# Patient Record
Sex: Female | Born: 1955 | Race: White | Hispanic: No | Smoking: Former smoker
Health system: Southern US, Community
[De-identification: ages and names within clinical notes are randomized; demographics above are authoritative.]

## PROBLEM LIST (undated history)

## (undated) DIAGNOSIS — I951 Orthostatic hypotension: Secondary | ICD-10-CM

## (undated) DIAGNOSIS — R002 Palpitations: Secondary | ICD-10-CM

## (undated) DIAGNOSIS — I4891 Unspecified atrial fibrillation: Secondary | ICD-10-CM

## (undated) DIAGNOSIS — I499 Cardiac arrhythmia, unspecified: Secondary | ICD-10-CM

## (undated) DIAGNOSIS — T7840XA Allergy, unspecified, initial encounter: Secondary | ICD-10-CM

## (undated) DIAGNOSIS — K635 Polyp of colon: Secondary | ICD-10-CM

## (undated) DIAGNOSIS — M810 Age-related osteoporosis without current pathological fracture: Secondary | ICD-10-CM

## (undated) DIAGNOSIS — K219 Gastro-esophageal reflux disease without esophagitis: Secondary | ICD-10-CM

## (undated) DIAGNOSIS — M1990 Unspecified osteoarthritis, unspecified site: Secondary | ICD-10-CM

## (undated) DIAGNOSIS — J189 Pneumonia, unspecified organism: Secondary | ICD-10-CM

## (undated) DIAGNOSIS — C801 Malignant (primary) neoplasm, unspecified: Secondary | ICD-10-CM

## (undated) DIAGNOSIS — Z8619 Personal history of other infectious and parasitic diseases: Secondary | ICD-10-CM

## (undated) DIAGNOSIS — R7989 Other specified abnormal findings of blood chemistry: Secondary | ICD-10-CM

## (undated) DIAGNOSIS — H269 Unspecified cataract: Secondary | ICD-10-CM

## (undated) DIAGNOSIS — Z8542 Personal history of malignant neoplasm of other parts of uterus: Secondary | ICD-10-CM

## (undated) DIAGNOSIS — D352 Benign neoplasm of pituitary gland: Secondary | ICD-10-CM

## (undated) DIAGNOSIS — E785 Hyperlipidemia, unspecified: Secondary | ICD-10-CM

## (undated) DIAGNOSIS — H9190 Unspecified hearing loss, unspecified ear: Secondary | ICD-10-CM

## (undated) HISTORY — DX: Personal history of malignant neoplasm of other parts of uterus: Z85.42

## (undated) HISTORY — DX: Personal history of other infectious and parasitic diseases: Z86.19

## (undated) HISTORY — PX: ABDOMINAL HYSTERECTOMY: SHX81

## (undated) HISTORY — DX: Unspecified atrial fibrillation: I48.91

## (undated) HISTORY — DX: Polyp of colon: K63.5

## (undated) HISTORY — DX: Gastro-esophageal reflux disease without esophagitis: K21.9

## (undated) HISTORY — DX: Benign neoplasm of pituitary gland: D35.2

## (undated) HISTORY — DX: Age-related osteoporosis without current pathological fracture: M81.0

## (undated) HISTORY — DX: Orthostatic hypotension: I95.1

## (undated) HISTORY — DX: Hyperlipidemia, unspecified: E78.5

## (undated) HISTORY — DX: Unspecified cataract: H26.9

## (undated) HISTORY — DX: Other specified abnormal findings of blood chemistry: R79.89

## (undated) HISTORY — PX: CATARACT EXTRACTION: SUR2

## (undated) HISTORY — DX: Unspecified hearing loss, unspecified ear: H91.90

## (undated) HISTORY — DX: Allergy, unspecified, initial encounter: T78.40XA

---
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
## 1998-05-20 DIAGNOSIS — I4891 Unspecified atrial fibrillation: Secondary | ICD-10-CM

## 1998-05-20 HISTORY — DX: Unspecified atrial fibrillation: I48.91

## 1998-10-17 ENCOUNTER — Inpatient Hospital Stay (HOSPITAL_COMMUNITY): Admission: EM | Admit: 1998-10-17 | Discharge: 1998-10-18 | Payer: Self-pay

## 1998-10-17 ENCOUNTER — Encounter: Payer: Self-pay | Admitting: Cardiology

## 1998-11-15 ENCOUNTER — Other Ambulatory Visit: Admission: RE | Admit: 1998-11-15 | Discharge: 1998-11-15 | Payer: Self-pay | Admitting: Obstetrics and Gynecology

## 2003-09-07 ENCOUNTER — Ambulatory Visit (HOSPITAL_COMMUNITY): Admission: RE | Admit: 2003-09-07 | Discharge: 2003-09-07 | Payer: Self-pay | Admitting: Family Medicine

## 2003-10-05 ENCOUNTER — Encounter: Admission: RE | Admit: 2003-10-05 | Discharge: 2003-10-05 | Payer: Self-pay | Admitting: Family Medicine

## 2004-07-10 ENCOUNTER — Ambulatory Visit (HOSPITAL_COMMUNITY): Admission: RE | Admit: 2004-07-10 | Discharge: 2004-07-10 | Payer: Self-pay | Admitting: Family Medicine

## 2004-07-17 ENCOUNTER — Other Ambulatory Visit: Admission: RE | Admit: 2004-07-17 | Discharge: 2004-07-17 | Payer: Self-pay | Admitting: Obstetrics and Gynecology

## 2004-08-06 ENCOUNTER — Encounter: Admission: RE | Admit: 2004-08-06 | Discharge: 2004-08-06 | Payer: Self-pay | Admitting: Obstetrics and Gynecology

## 2005-12-13 ENCOUNTER — Ambulatory Visit (HOSPITAL_COMMUNITY): Admission: RE | Admit: 2005-12-13 | Discharge: 2005-12-13 | Payer: Self-pay | Admitting: Obstetrics and Gynecology

## 2005-12-24 ENCOUNTER — Encounter: Admission: RE | Admit: 2005-12-24 | Discharge: 2005-12-24 | Payer: Self-pay | Admitting: Obstetrics and Gynecology

## 2009-12-19 ENCOUNTER — Other Ambulatory Visit: Admission: RE | Admit: 2009-12-19 | Discharge: 2009-12-19 | Payer: Self-pay | Admitting: Obstetrics and Gynecology

## 2010-05-20 DIAGNOSIS — H9190 Unspecified hearing loss, unspecified ear: Secondary | ICD-10-CM

## 2010-05-20 HISTORY — DX: Unspecified hearing loss, unspecified ear: H91.90

## 2010-06-10 ENCOUNTER — Encounter: Payer: Self-pay | Admitting: Obstetrics and Gynecology

## 2010-06-11 ENCOUNTER — Encounter: Payer: Self-pay | Admitting: Neurology

## 2011-07-23 ENCOUNTER — Ambulatory Visit (INDEPENDENT_AMBULATORY_CARE_PROVIDER_SITE_OTHER): Payer: 59 | Admitting: Family Medicine

## 2011-07-23 DIAGNOSIS — H9190 Unspecified hearing loss, unspecified ear: Secondary | ICD-10-CM

## 2011-07-23 DIAGNOSIS — R42 Dizziness and giddiness: Secondary | ICD-10-CM

## 2011-07-23 LAB — POCT CBC
Granulocyte percent: 60.4 %G (ref 37–80)
HCT, POC: 44.8 % (ref 37.7–47.9)
Hemoglobin: 14.6 g/dL (ref 12.2–16.2)
MCV: 97.7 fL — AB (ref 80–97)
POC Granulocyte: 5.3 (ref 2–6.9)
POC MID %: 6.7 %M (ref 0–12)
WBC: 8.8 10*3/uL (ref 4.6–10.2)

## 2011-07-23 LAB — POCT SEDIMENTATION RATE: POCT SED RATE: 15 mm/hr (ref 0–22)

## 2011-07-23 LAB — POCT GLYCOSYLATED HEMOGLOBIN (HGB A1C): Hemoglobin A1C: 5

## 2011-07-23 MED ORDER — PREDNISONE 20 MG PO TABS: ORAL_TABLET | ORAL | Status: AC

## 2011-07-23 NOTE — Patient Instructions (Signed)
Take medicines as ordered. If dizzy be very careful with driving or other machinery. Return if worse.

## 2011-07-23 NOTE — Progress Notes (Signed)
Subjective Crystal Bauer is a 56 year old white female who comes in here with history of generally having been pretty healthy. She had some test and by Dr. love a few years ago and had a positive testing they could indicate a Sjogren's or lupus, and she is going to see a rheumatologist Dr. Kellie Simmering next week. She has not followed up with the neurologist. In [facility] had any episode of ear problems, and the nurse at work told her to just take some Benadryl and give a little time which she did. She has continued to have decreased hearing in that left ear and saw Dr. Ezzard Standing last week. He did testing as well as having the audiologist test. She has diminished hearing in the left ear. He treated to a possible viral nerve involvement of the ear, and asked her to get checked again in about 6 months. However today when she got up she was acutely dizzy. She had to lay back down because the dizziness was intense enough. This is continued to bother her through the day but the symptoms diminished while waiting here in the office the last several hours. Did not take any medications today.  She has not had any cold or sore throat symptoms or any coughing. Otherwise she feels okay.  Objective: Alert oriented lady in no acute distress at this time TMs appear entirely normal. Eyes are PERRLA. EOMs intact. Throat clear. Neck supple without significant lymphadenopathy. She feels a little abnormal at the angle of the left jaw, but there is only minimal nodal enlargement. No carotid bruits. Chest is clear to auscultation. Heart regular.  Neurologic exam was grossly normal with cranial nerves 2-12 intact, Romberg negative, tandem walk normal, finger to nose normal, and grossly she is using all extremities normal.  Assessment: Nonspecific dizziness and left-sided hearing loss, with acute flare, etiology undetermined.

## 2011-07-24 LAB — COMPREHENSIVE METABOLIC PANEL
AST: 20 U/L (ref 0–37)
BUN: 11 mg/dL (ref 6–23)
CO2: 27 mEq/L (ref 19–32)
Calcium: 9.9 mg/dL (ref 8.4–10.5)
Chloride: 106 mEq/L (ref 96–112)
Glucose, Bld: 84 mg/dL (ref 70–99)
Potassium: 4.9 mEq/L (ref 3.5–5.3)
Sodium: 140 mEq/L (ref 135–145)
Total Bilirubin: 0.4 mg/dL (ref 0.3–1.2)

## 2011-07-30 ENCOUNTER — Encounter: Payer: Self-pay | Admitting: *Deleted

## 2012-10-01 ENCOUNTER — Encounter: Payer: Self-pay | Admitting: Internal Medicine

## 2012-10-01 ENCOUNTER — Ambulatory Visit (INDEPENDENT_AMBULATORY_CARE_PROVIDER_SITE_OTHER): Payer: BC Managed Care – PPO | Admitting: Internal Medicine

## 2012-10-01 ENCOUNTER — Other Ambulatory Visit (INDEPENDENT_AMBULATORY_CARE_PROVIDER_SITE_OTHER): Payer: BC Managed Care – PPO

## 2012-10-01 VITALS — BP 108/72 | HR 63 | Temp 98.4°F | Ht 67.5 in | Wt 186.5 lb

## 2012-10-01 DIAGNOSIS — Z130 Encounter for screening for diseases of the blood and blood-forming organs and certain disorders involving the immune mechanism: Secondary | ICD-10-CM

## 2012-10-01 DIAGNOSIS — Z131 Encounter for screening for diabetes mellitus: Secondary | ICD-10-CM

## 2012-10-01 DIAGNOSIS — Z1329 Encounter for screening for other suspected endocrine disorder: Secondary | ICD-10-CM

## 2012-10-01 DIAGNOSIS — L989 Disorder of the skin and subcutaneous tissue, unspecified: Secondary | ICD-10-CM

## 2012-10-01 DIAGNOSIS — Z13220 Encounter for screening for lipoid disorders: Secondary | ICD-10-CM

## 2012-10-01 DIAGNOSIS — H9312 Tinnitus, left ear: Secondary | ICD-10-CM

## 2012-10-01 DIAGNOSIS — Z1239 Encounter for other screening for malignant neoplasm of breast: Secondary | ICD-10-CM

## 2012-10-01 DIAGNOSIS — Z1211 Encounter for screening for malignant neoplasm of colon: Secondary | ICD-10-CM

## 2012-10-01 DIAGNOSIS — Z0000 Encounter for general adult medical examination without abnormal findings: Secondary | ICD-10-CM

## 2012-10-01 DIAGNOSIS — H9319 Tinnitus, unspecified ear: Secondary | ICD-10-CM

## 2012-10-01 LAB — LIPID PANEL
Cholesterol: 214 mg/dL — ABNORMAL HIGH (ref 0–200)
HDL: 51.4 mg/dL (ref >39.00)
Triglycerides: 109 mg/dL (ref 0.0–149.0)

## 2012-10-01 LAB — CBC
HCT: 42.3 % (ref 36.0–46.0)
Hemoglobin: 14.4 g/dL (ref 12.0–15.0)
MCV: 95.6 fl (ref 78.0–100.0)
Platelets: 284 10*3/uL (ref 150.0–400.0)
RBC: 4.43 Mil/uL (ref 3.87–5.11)
WBC: 7.1 10*3/uL (ref 4.5–10.5)

## 2012-10-01 LAB — BASIC METABOLIC PANEL
Calcium: 9.2 mg/dL (ref 8.4–10.5)
GFR: 87.55 mL/min (ref >60.00)
Glucose, Bld: 87 mg/dL (ref 70–99)
Potassium: 4.7 mEq/L (ref 3.5–5.1)

## 2012-10-01 LAB — LDL CHOLESTEROL, DIRECT: Direct LDL: 142.5 mg/dL

## 2012-10-01 LAB — TSH: TSH: 0.86 u[IU]/mL (ref 0.35–5.50)

## 2012-10-01 NOTE — Progress Notes (Signed)
HPI  Pt presents to the clinic today to establish care. She has not had a PCP, just a gyn. She does have some concerns today. 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 She is not sure what is causing them. She would like to have them removed if at all possible. She also has had c/o feelings of fluid in her left ear along with a buzzing sound. She has been evaluated by ENT who said that she had a normal exam. The buzzing is constant. She is not on any medications that would cause the constant buzzing sound. She has not had any trauma to the ear. She is wondering if there is any further evaluation that could be done to assess for the cause of her ear issue. She has has a spot on her right leg where an actinic keratosis has been removed some years back. The area recently became itchy and she scratched it. It opened up, bled a little and scabbed over. The concerning part is that there seems to be and expanding area of redness around the area where she had the biopsy taking. This has been expanding over the last 2 months. She has not seen a dermatologist, she would like referral  Today.  Flu: no Tetanus: UTD Eye doctor: yearly Dentist: yearly Colonoscopy: never Mammogram: 2012 LMP: menopausal Pap: 2012  History reviewed. No pertinent past medical history.  No current outpatient prescriptions on file.   No current facility-administered medications for this visit.    Allergies  Allergen Reactions  . Erythromycin Shortness Of Breath  . Quinolones Other (See Comments)    Insomnia     Family History  Problem Relation Age of Onset  . Cancer Mother     breast, colon  . Diabetes Maternal Grandmother   . Stroke Neg Hx     History   Social History  . Marital Status: Divorced    Spouse Name: N/A    Number of Children: N/A  . Years of Education: N/A   Occupational History  . Not on  file.   Social History Main Topics  . Smoking status: Former Games developer  . Smokeless tobacco: Not on file  . Alcohol Use: No  . Drug Use: No  . Sexually Active: Not on file   Other Topics Concern  . Not on file   Social History Narrative  . No narrative on file    ROS:  Constitutional: Denies fever, malaise, fatigue, headache or abrupt weight changes.  HEENT: Pt reports buzzing in left ear.Denies eye pain, eye redness, ear pain,  wax buildup, runny nose, nasal congestion, bloody nose, or sore throat. Respiratory: Denies difficulty breathing, shortness of breath, cough or sputum production.   Cardiovascular: Denies chest pain, chest tightness, palpitations or swelling in the hands or feet.  Gastrointestinal: Denies abdominal pain, bloating, constipation, diarrhea or blood in the stool.  GU: Denies frequency, urgency, pain with urination, blood in urine, odor or discharge. Musculoskeletal: Denies decrease in range of motion, difficulty with gait, muscle pain or joint pain and swelling.  Skin: Pt reports lesion on right ankle and lipomas on back. Denies rashes, or ulcercations.  Neurological: Denies dizziness, difficulty with memory, difficulty with speech or problems with balance and coordination.   No other specific complaints in a complete review of systems (except as listed in HPI above).  PE:  BP 108/72  Pulse 63  Temp(Src) 98.4 F (36.9 C) (Oral)  Ht 5' 7.5" (1.715 m)  Wt 186 lb 8 oz (84.596 kg)  BMI 28.76 kg/m2  SpO2 97% Wt Readings from Last 3 Encounters:  10/01/12 186 lb 8 oz (84.596 kg)  07/23/11 186 lb (84.369 kg)    General: Appears her stated age, well developed, well nourished in NAD. Skin: small cyst under skin of left upper back, do not appear to be lipomas. Small scab noted on right medial ankle surrounded by redness, not cellulitis, and appears to be very vascular with thinning of the skin. HEENT: Head: normal shape and size; Eyes: sclera white, no icterus,  conjunctiva pink, PERRLA and EOMs intact; Ears: Tm's gray and intact, normal light reflex; Nose: mucosa pink and moist, septum midline; Throat/Mouth: Teeth present, mucosa pink and moist, no lesions or ulcerations noted. Air conduction and bone conduction normal. Neck: Normal range of motion. Neck supple, trachea midline. No massses, lumps or thyromegaly present.  Cardiovascular: Normal rate and rhythm. S1,S2 noted.  No murmur, rubs or gallops noted. No JVD or BLE edema. No carotid bruits noted. Pulmonary/Chest: Normal effort and positive vesicular breath sounds. No respiratory distress. No wheezes, rales or ronchi noted.  Abdomen: Soft and nontender. Normal bowel sounds, no bruits noted. No distention or masses noted. Liver, spleen and kidneys non palpable. Musculoskeletal: Normal range of motion. No signs of joint swelling. No difficulty with gait.  Neurological: Alert and oriented. Cranial nerves II-XII intact. Coordination normal. +DTRs bilaterally. Psychiatric: Mood and affect normal. Behavior is normal. Judgment and thought content normal.      Assessment and Plan:  Preventative Health Maintenance:  Will set pt up for mammogram and colonoscopy Will repeat pap in 2017 Will obtain lab work today  Multiple skin lesions:  Will refer to dermatology for referral and possibly biopsy I do not think the lumps on the back are lipomas but rather cyst under the skin  Tinnitus:  Negative exam by ENT Will order CT head to assess for acoustic neuroma or other possible cause of tinnitus

## 2012-10-01 NOTE — Patient Instructions (Signed)

## 2012-10-05 ENCOUNTER — Encounter: Payer: Self-pay | Admitting: Internal Medicine

## 2012-10-15 ENCOUNTER — Ambulatory Visit (HOSPITAL_COMMUNITY)
Admission: RE | Admit: 2012-10-15 | Discharge: 2012-10-15 | Disposition: A | Discharge status: Home / Self Care | Payer: BC Managed Care – PPO | Source: Ambulatory Visit | Attending: Internal Medicine | Admitting: Internal Medicine

## 2012-10-15 DIAGNOSIS — Z0000 Encounter for general adult medical examination without abnormal findings: Secondary | ICD-10-CM

## 2012-10-15 DIAGNOSIS — Z1231 Encounter for screening mammogram for malignant neoplasm of breast: Secondary | ICD-10-CM | POA: Insufficient documentation

## 2012-10-15 DIAGNOSIS — Z1239 Encounter for other screening for malignant neoplasm of breast: Secondary | ICD-10-CM

## 2012-10-20 ENCOUNTER — Other Ambulatory Visit: Payer: Self-pay | Admitting: Surgery

## 2012-11-23 ENCOUNTER — Encounter: Payer: Self-pay | Admitting: Internal Medicine

## 2012-11-23 ENCOUNTER — Ambulatory Visit (AMBULATORY_SURGERY_CENTER): Payer: BC Managed Care – PPO | Admitting: *Deleted

## 2012-11-23 VITALS — Ht 68.0 in | Wt 187.6 lb

## 2012-11-23 DIAGNOSIS — Z1211 Encounter for screening for malignant neoplasm of colon: Secondary | ICD-10-CM

## 2012-11-23 MED ORDER — MOVIPREP 100 G PO SOLR: 1.0000 | Freq: Once | ORAL | Status: DC

## 2012-11-23 NOTE — Progress Notes (Signed)
No allergy to egg or soy products. ewm No home 02 use. ewm Some post op nausea in the past but no other issues with past sedation. ewm No previous colonoscopy. ewm Pt's mom diagnosed with colon cancer age 56 last year. Doing well. ewm eemi video sent to pt's email. ewm

## 2012-12-14 ENCOUNTER — Encounter: Payer: Self-pay | Admitting: Internal Medicine

## 2012-12-14 ENCOUNTER — Ambulatory Visit (AMBULATORY_SURGERY_CENTER): Payer: BC Managed Care – PPO | Admitting: Internal Medicine

## 2012-12-14 VITALS — BP 114/65 | HR 64 | Temp 99.0°F | Resp 16 | Ht 68.0 in | Wt 187.0 lb

## 2012-12-14 DIAGNOSIS — Z1211 Encounter for screening for malignant neoplasm of colon: Secondary | ICD-10-CM

## 2012-12-14 DIAGNOSIS — D126 Benign neoplasm of colon, unspecified: Secondary | ICD-10-CM

## 2012-12-14 MED ORDER — SODIUM CHLORIDE 0.9 % IV SOLN: 500.0000 mL | INTRAVENOUS | Status: DC

## 2012-12-14 NOTE — Op Note (Signed)
[facility] [address] [place], [address]   COLONOSCOPY PROCEDURE REPORT  PATIENT: Crystal Bauer, Crystal Bauer  MR#: [id number] BIRTHDATE: 1955-09-01 , 57  yrs. old GENDER: Female ENDOSCOPIST: Beverley Fiedler, MD REFERRED QI:ONGEX Hopper, M.D. PROCEDURE DATE:  12/14/2012 PROCEDURE:   Colonoscopy with cold biopsy polypectomy ASA CLASS:   Class II INDICATIONS:average risk screening and first colonoscopy. MEDICATIONS: MAC sedation, administered by CRNA, Propofol (Diprivan), and Propofol (Diprivan) 160 mg IV  DESCRIPTION OF PROCEDURE:   After the risks benefits and alternatives of the procedure were thoroughly explained, informed consent was obtained.  A digital rectal exam revealed no rectal mass.   The LB [id number]-H190 [id number]  endoscope was introduced through the anus and advanced to the cecum, which was identified by both the appendix and ileocecal valve. No adverse events experienced. The quality of the prep was good, using MoviPrep  The instrument was then slowly withdrawn as the colon was fully examined.   COLON FINDINGS: A sessile polyp measuring 4 mm in size was found in the rectum.  A polypectomy was performed with cold forceps.  The resection was complete and the polyp tissue was completely retrieved.   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  The opening was small.  Retroflexed views revealed internal hemorrhoids and hypertrophied anal papillae. The time to cecum=2 minutes 44 seconds.  Withdrawal time=9 minutes 49 seconds.  The scope was withdrawn and the procedure completed. COMPLICATIONS: There were no complications.  ENDOSCOPIC IMPRESSION: 1.   Sessile polyp measuring 4 mm in size was found in the rectum; polypectomy was performed with cold forceps 2.   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  Await biopsy results 2.  High fiber diet 3.  If the polyp removed today is proven to be an adenomatous (pre-cancerous) polyp, you will  need a repeat colonoscopy in 5 years.  Otherwise you should continue to follow colorectal cancer screening guidelines for "routine risk" patients with colonoscopy in 10 years.  You will receive a letter within 1-2 weeks with the results of your biopsy as well as final recommendations.  Please call my office if you have not received a letter after 3 weeks.   eSigned:  Beverley Fiedler, MD 12/14/2012 10:23 AM   cc: The Patient and Janace Hoard, MD

## 2012-12-14 NOTE — Progress Notes (Signed)
Called to room to assist during endoscopic procedure.  Patient ID and intended procedure confirmed with present staff. Received instructions for my participation in the procedure from the performing physician.  

## 2012-12-14 NOTE — Patient Instructions (Addendum)
YOU HAD AN ENDOSCOPIC PROCEDURE TODAY AT THE [facility]: Refer to the procedure report that was given to you for any specific questions about what was found during the examination.  If the procedure report does not answer your questions, please call your gastroenterologist to clarify.  If you requested that your care partner not be given the details of your procedure findings, then the procedure report has been included in a sealed envelope for you to review at your convenience later.  YOU SHOULD EXPECT: Some feelings of bloating in the abdomen. Passage of more gas than usual.  Walking can help get rid of the air that was put into your GI tract during the procedure and reduce the bloating. If you had a lower endoscopy (such as a colonoscopy or flexible sigmoidoscopy) you may notice spotting of blood in your stool or on the toilet paper. If you underwent a bowel prep for your procedure, then you may not have a normal bowel movement for a few days.  DIET: Your first meal following the procedure should be a light meal and then it is ok to progress to your normal diet.  A half-sandwich or bowl of soup is an example of a good first meal.  Heavy or fried foods are harder to digest and may make you feel nauseous or bloated.  Likewise meals heavy in dairy and vegetables can cause extra gas to form and this can also increase the bloating.  Drink plenty of fluids but you should avoid alcoholic beverages for 24 hours.  ACTIVITY: Your care partner should take you home directly after the procedure.  You should plan to take it easy, moving slowly for the rest of the day.  You can resume normal activity the day after the procedure however you should NOT DRIVE or use heavy machinery for 24 hours (because of the sedation medicines used during the test).    SYMPTOMS TO REPORT IMMEDIATELY: A gastroenterologist can be reached at any hour.  During normal business hours, 8:30 AM to 5:00 PM Monday through Friday,  call [phone].  After hours and on weekends, please call the GI answering service at [phone] who will take a message and have the physician on call contact you.   Following lower endoscopy (colonoscopy or flexible sigmoidoscopy):  Excessive amounts of blood in the stool  Significant tenderness or worsening of abdominal pains  Swelling of the abdomen that is new, acute  Fever of 100F or higher  FOLLOW UP: If any biopsies were taken you will be contacted by phone or by letter within the next 1-3 weeks.  Call your gastroenterologist if you have not heard about the biopsies in 3 weeks.  Our staff will call the home number listed on your records the next business day following your procedure to check on you and address any questions or concerns that you may have at that time regarding the information given to you following your procedure. This is a courtesy call and so if there is no answer at the home number and we have not heard from you through the emergency physician on call, we will assume that you have returned to your regular daily activities without incident.  SIGNATURES/CONFIDENTIALITY: You and/or your care partner have signed paperwork which will be entered into your electronic medical record.  These signatures attest to the fact that that the information above on your After Visit Summary has been reviewed and is understood.  Full responsibility of the confidentiality of this   discharge information lies with you and/or your care-partner.   Resume medications. Information on polyps, diverticulosis and high fiber diet with discharge instructions. 

## 2012-12-14 NOTE — Progress Notes (Signed)
Patient did not experience any of the following events: a burn prior to discharge; a fall within the facility; wrong site/side/patient/procedure/implant event; or a hospital transfer or hospital admission upon discharge from the facility. (G8907) Patient did not have preoperative order for IV antibiotic SSI prophylaxis. (G8918)  

## 2012-12-14 NOTE — Progress Notes (Signed)
Procedure ends, to recovery, report given and VSS. 

## 2012-12-15 ENCOUNTER — Telehealth: Payer: Self-pay

## 2012-12-15 NOTE — Telephone Encounter (Signed)
  Follow up Call-  Call back number 12/14/2012  Post procedure Call Back phone  # [phone]  Permission to leave phone message Yes     Patient questions:  Do you have a fever, pain , or abdominal swelling? no Pain Score  0 *  Have you tolerated food without any problems? yes  Have you been able to return to your normal activities? yes  Do you have any questions about your discharge instructions: Diet   no Medications  no Follow up visit  no  Do you have questions or concerns about your Care? no  Actions: * If pain score is 4 or above: No action needed, pain <4.  No problems per the pt. [name]

## 2012-12-17 ENCOUNTER — Encounter: Payer: Self-pay | Admitting: Internal Medicine

## 2013-01-28 ENCOUNTER — Ambulatory Visit (INDEPENDENT_AMBULATORY_CARE_PROVIDER_SITE_OTHER): Payer: BC Managed Care – PPO | Admitting: Internal Medicine

## 2013-01-28 ENCOUNTER — Encounter: Payer: Self-pay | Admitting: Internal Medicine

## 2013-01-28 VITALS — BP 104/62 | HR 81 | Temp 98.0°F | Wt 190.8 lb

## 2013-01-28 DIAGNOSIS — H9319 Tinnitus, unspecified ear: Secondary | ICD-10-CM

## 2013-01-28 DIAGNOSIS — H9312 Tinnitus, left ear: Secondary | ICD-10-CM

## 2013-01-28 DIAGNOSIS — E041 Nontoxic single thyroid nodule: Secondary | ICD-10-CM

## 2013-01-28 NOTE — Patient Instructions (Signed)
Thyroid Cyst  The thyroid gland is a butterfly-shaped gland in the middle of the neck, located just below the voice box. It makes thyroid hormone. Thyroid hormone has an effect on nearly all tissues of your body by regulating your metabolism. Metabolism is the breakdown and use of food that you eat or energy that is stored in your body. Your metabolism affects your heart rate, blood pressure, body temperature, and weight.   Thyroid cysts are enlarged fluid filled regions of the thyroid gland. These cysts range in size and may expand and enlarge suddenly. Rapidly expanding cysts may cause pain, difficulty swallowing, and rarely, difficulty breathing. Most cysts of the thyroid are not cancerous (benign).  SYMPTOMS  Bleeding may occur within the cyst. If the bleeding is severe, the cyst may get larger and produce problems in the neck, including swelling that may produce pain and difficultly swallowing. If the vocal cords are compressed, hoarseness may occur. If the windpipe is compressed, you may have difficulty breathing.  DIAGNOSIS   A thyroid cyst is diagnosed through physical exam. The diagnosis can be confirmed by an ultrasound exam of the neck. This creates a picture by bouncing sound waves off the thyroid gland. Sometimes the cysts are drained using a fine needle. The fluid is then sent to the lab where it can be examined. This is done to see if any cells in the fluid are cancerous. If they are found to be cancerous, you will need further treatment.   TREATMENT   If the fluid in your neck does not show evidence of cancer, your caregiver may just want to monitor you with yearly ultrasound exams. Sometimes cysts need to be removed surgically.  Document Released: 03/29/2004 Document Revised: 07/29/2011 Document Reviewed: 07/12/2010  Exit[employer]® Patient Information ©2014 [employer].

## 2013-01-28 NOTE — Progress Notes (Signed)
Subjective:    Patient ID: Crystal Bauer, female    DOB: [birth year], 56 y.o.   MRN: [id number]  HPI  Pt presents to the clinic today with c/o a nodule on the left side of her thyroid. This just popped up 1 week ago. It is nontender but she reports her throat does feel hoarse. She does have a positive family hx of thyroid cancer (aunt) and her daughter does have benign thryoid nodules. She did have thyromegaly noted on her last exam but TSH was normal. Additionally pt request referral to ent for chronic tinnitus of left ear.  Review of Systems  Past Medical History  Diagnosis Date  . History of chicken pox   . 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 Atrial fibrillation 2000    from stress/increased caffeine use    Current Outpatient Prescriptions  Medication Sig Dispense Refill  . Cholecalciferol (VITAMIN D3) 5000 UNITS TABS Take 1 tablet by mouth daily.      . fish oil-omega-3 fatty acids 1000 MG capsule Take 2 g by mouth daily.      . Multiple Vitamins-Minerals (WOMENS MULTIVITAMIN PLUS PO) Take 1 capsule by mouth daily.      . TURMERIC CURCUMIN PO Take 1 capsule by mouth daily.       No current facility-administered medications for this visit.    Allergies  Allergen Reactions  . Erythromycin Shortness Of Breath  . Quinolones Other (See Comments)    Insomnia     Family History  Problem Relation Age of Onset  . Cancer Mother     breast, colon  . Colon cancer Mother 29  . Diabetes Maternal Grandmother   . Stroke Neg Hx     History   Social History  . Marital Status: Divorced    Spouse Name: N/A    Number of Children: N/A  . Years of Education: 12+   Occupational History  . RN    Social History Main Topics  . Smoking status: Former Games developer  . Smokeless tobacco: Never Used  . Alcohol Use: No  . Drug Use: No  . Sexual Activity: Not on file   Other Topics Concern  . Not on file   Social History Narrative   Regular exercise-no   Caffeine Use-yes      Constitutional: Denies fever, malaise, fatigue, headache or abrupt weight changes.  HEENT: Pt reports thyroid nodule. Denies eye pain, eye redness, ear pain, ringing in the ears, wax buildup, runny nose, nasal congestion, bloody nose, or sore throat. Respiratory: Denies difficulty breathing, shortness of breath, cough or sputum production.     No other specific complaints in a complete review of systems (except as listed in HPI above).     Objective:   Physical Exam   BP 104/62  Pulse 81  Temp(Src) 98 F (36.7 C) (Oral)  Wt 190 lb 12.8 oz (86.546 kg)  BMI 29.02 kg/m2  SpO2 97% Wt Readings from Last 3 Encounters:  01/28/13 190 lb 12.8 oz (86.546 kg)  12/14/12 187 lb (84.823 kg)  11/23/12 187 lb 9.6 oz (85.095 kg)    General: Appears her stated age, well developed, well nourished in NAD. HEENT: Head: normal shape and size; Eyes: sclera white, no icterus, conjunctiva pink, PERRLA and EOMs intact; Ears: Tm's gray and intact, normal light reflex; Nose: mucosa pink and moist, septum midline; Throat/Mouth: Teeth present, mucosa pink and moist, no exudate, lesions or ulcerations noted.  Neck: Normal range of motion. Neck  supple, trachea midline. No massses, lumps present.  Cardiovascular: Normal rate and rhythm. S1,S2 noted.  No murmur, rubs or gallops noted. No JVD or BLE edema. No carotid bruits noted. Thyromegaly noted. Nodule noted at 2 oclock position. Pulmonary/Chest: Normal effort and positive vesicular breath sounds. No respiratory distress. No wheezes, rales or ronchi noted.  :  BMET    Component Value Date/Time   NA 140 10/01/2012 1525   K 4.7 10/01/2012 1525   CL 105 10/01/2012 1525   CO2 29 10/01/2012 1525   GLUCOSE 87 10/01/2012 1525   BUN 12 10/01/2012 1525   CREATININE 0.7 10/01/2012 1525   CREATININE 0.66 07/23/2011 1554   CALCIUM 9.2 10/01/2012 1525    Lipid Panel     Component Value Date/Time   CHOL 214* 10/01/2012 1525   TRIG 109.0 10/01/2012 1525   HDL 51.40  10/01/2012 1525   CHOLHDL 4 10/01/2012 1525   VLDL 21.8 10/01/2012 1525    CBC    Component Value Date/Time   WBC 7.1 10/01/2012 1525   WBC 8.8 07/23/2011 1607   RBC 4.43 10/01/2012 1525   RBC 4.59 07/23/2011 1607   HGB 14.4 10/01/2012 1525   HGB 14.6 07/23/2011 1607   HCT 42.3 10/01/2012 1525   HCT 44.8 07/23/2011 1607   PLT 284.0 10/01/2012 1525   MCV 95.6 10/01/2012 1525   MCV 97.7* 07/23/2011 1607   MCH 31.8* 07/23/2011 1607   MCHC 34.0 10/01/2012 1525   MCHC 32.6 07/23/2011 1607   RDW 13.6 10/01/2012 1525    Hgb A1C Lab Results  Component Value Date   HGBA1C 5.6 10/01/2012         Assessment & Plan:   Thyroid nodule, left, new onset:  Will obtain thyroid ultrasound May need FNA versus referral to endo  Tinniutus, left ear:  Will refer to ENT for further evaluation  Will f/u after test results

## 2013-02-03 ENCOUNTER — Ambulatory Visit
Admission: RE | Admit: 2013-02-03 | Discharge: 2013-02-03 | Disposition: A | Discharge status: Home / Self Care | Payer: BC Managed Care – PPO | Source: Ambulatory Visit | Attending: Internal Medicine | Admitting: Internal Medicine

## 2013-02-03 DIAGNOSIS — E041 Nontoxic single thyroid nodule: Secondary | ICD-10-CM

## 2013-02-04 ENCOUNTER — Other Ambulatory Visit: Payer: Self-pay | Admitting: Internal Medicine

## 2013-02-04 DIAGNOSIS — E042 Nontoxic multinodular goiter: Secondary | ICD-10-CM

## 2013-02-26 ENCOUNTER — Encounter: Payer: Self-pay | Admitting: Internal Medicine

## 2013-02-26 ENCOUNTER — Ambulatory Visit (INDEPENDENT_AMBULATORY_CARE_PROVIDER_SITE_OTHER): Payer: BC Managed Care – PPO | Admitting: Internal Medicine

## 2013-02-26 VITALS — BP 102/60 | HR 85 | Temp 98.3°F | Resp 12 | Ht 68.0 in | Wt 186.6 lb

## 2013-02-26 DIAGNOSIS — E042 Nontoxic multinodular goiter: Secondary | ICD-10-CM | POA: Insufficient documentation

## 2013-02-26 DIAGNOSIS — D352 Benign neoplasm of pituitary gland: Secondary | ICD-10-CM

## 2013-02-26 LAB — TSH: TSH: 0.63 u[IU]/mL (ref 0.35–5.50)

## 2013-02-26 NOTE — Patient Instructions (Addendum)
Please return in 1 year, but let me know if the cyst enlarges.

## 2013-02-26 NOTE — Progress Notes (Signed)
Patient ID: Crystal Bauer, female   DOB: 10-26-1955, 57 y.o.   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  Patient complained of an enlarging left lower neck mass which was palpated at the last visit with her PCP, approximately a month ago.  Thyroid U/S on 02/04/2013 showed bilateral thyroid nodules, small, less than 5 mm in the right lobe, and 3 larger nodules in the left lobe. The largest lesion is a 1.7 x 1.0 x 1.3 cm cyst situated in the left upper lobe, with post-acoustic shadowing, and colloid particles on the periphery.  I reviewed pt's thyroid tests: Lab Results  Component Value Date   TSH 0.86 10/01/2012    Pt describes + hoarseness, but has dysphagia for food, not with saliva. No odynophagia, SOB with lying down.  Pt c/o: - + palpitations (more lately) - no heat intolerance/cold intolrance - + mild tremors - chronic L>R - no anxiety/depression - no fatigue - no hyperdefecation/constipation - + weight gain  Pt has FH of thyroid ds in daughter: MNG - takes Fulvic acid, dulce flakes (?) >> nodules shrank. + FH of thyroid cancer in maternal aunt - in her 30s. No h/o radiation tx to head or neck.  No seaweed or kelp, no recent contrast studies. No steroid use. No herbal supplements.   I reviewed her chart and she also has a history of h/o Afib in 2000 from drinking a lot of coffee + stressful job. In 2005, incordination problems, recurrent. At the end of the appointment, pt mentions in an "by the way" contexts, that she has a history of micro-prolactinoma, dx in 1992, status post treatment with Cabergoline up to 2000. Her last prolactin level was checked in 57~2004 and this was minimally elevated. She is now postmenopausal. Does not complain of galactorrhea, headaches, visual  problems.  ROS: Constitutional: no weight gain/loss, no fatigue, no subjective hyperthermia/hypothermia Eyes: no blurry vision, no xerophthalmia ENT: no sore throat,+  nodules palpated in throat, no dysphagia/odynophagia, + hoarseness, + tinnitus , decreased hearing Cardiovascular: no CP/SOB/palpitations/leg swelling Respiratory: no cough/SOB Gastrointestinal: no N/V/D/C Musculoskeletal: no muscle/joint aches Skin: no rashes Neurological: no tremors/numbness/tingling/dizziness Psychiatric: no depression/anxiety  Past Medical History  Diagnosis Date  . History of chicken pox   . 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 Atrial fibrillation 2000    from stress/increased caffeine use   Past Surgical History  Procedure Laterality Date  . Tonsillectomy and adenoidectomy  1962  . Cataract extraction     History   Social History  . Marital Status: Divorced    Spouse Name: N/A    Number of Children: 3: 1, 37, 87 year old  . Years of Education: 12+   Occupational History  . RN - Care manager    Social History Main Topics  . Smoking status: Former Games developer, quit in 2000  . Smokeless tobacco: Never Used  . Alcohol Use: No  . Drug Use: No  . Sexual Activity: No   Social History Narrative   Regular exercise-no   Caffeine Use-yes   Current Outpatient Prescriptions on File Prior to Visit  Medication Sig Dispense Refill  . Cholecalciferol (VITAMIN D3) 5000 UNITS TABS Take 1 tablet by mouth daily.      . fish oil-omega-3 fatty acids 1000 MG capsule Take  2 g by mouth daily.      . Multiple Vitamins-Minerals (WOMENS MULTIVITAMIN PLUS PO) Take 1 capsule by mouth daily.      . TURMERIC CURCUMIN PO Take 1 capsule by mouth daily.       No current facility-administered medications on file prior to visit.   Allergies  Allergen Reactions  . Erythromycin Shortness Of Breath  . Quinolones Other (See Comments)    Insomnia    Family History  Problem Relation Age of Onset  . Cancer Mother      breast, colon  . Colon cancer Mother 64  . Diabetes Maternal Grandmother   . Stroke Neg Hx    PE: BP 102/60  Pulse 85  Temp(Src) 98.3 F (36.8 C) (Oral)  Resp 12  Ht 5\' 8"  (1.727 m)  Wt 186 lb 9.6 oz (84.641 kg)  BMI 28.38 kg/m2  SpO2 98% Wt Readings from Last 3 Encounters:  02/26/13 186 lb 9.6 oz (84.641 kg)  01/28/13 190 lb 12.8 oz (86.546 kg)  12/14/12 187 lb (84.823 kg)   Constitutional: overweight, in NAD Eyes: PERRLA, EOMI, no exophthalmos ENT: moist mucous membranes, + thyroid nodule easily palpable in the left middle neck, no cervical lymphadenopathy Cardiovascular: RRR, No MRG Respiratory: CTA B Gastrointestinal: abdomen soft, NT, ND, BS+ Musculoskeletal: no deformities, strength intact in all 4;  Skin: moist, warm, no rashes Neurological: mild tremor with outstretched hands, DTR normal in all 4  ASSESSMENT: 1. MNG - thyroid U/S 02/04/2013:  Right thyroid lobe - 4.2 x 1.5 x 1.1 cm. Only small right thyroid nodules are present of no more than 5 mm in diameter.   Left thyroid lobe - 4.9 x 2.1 x 1.9 cm. Multiple left thyroid nodules are present. The largest is complex but primarily cystic in the upper pole of the left lobe measuring 1.7 x 1.0 x 1.3 cm. Peripheral calcifications are noted within this cystic lesion. (these do appear to be not calcifications but inspissated colloid due to presence of comet tail artifact. Nodules in the lower pole of the left lobe measure 1.2 x 0.7 x 1.1 cm, and 1.0 x 0.8 x 1.8 cm. One of them is a cyst.  Isthmus  -  Thickness: 3 mm in thickness. No nodules visualized.   Lymphadenopathy  - None visualized.  2. History of microprolactinoma - dx 1992 - previously on cabergoline until 2000, when she took herself off it due to palpitations - last prolactin check was ~2004, when it was mildly elevated - has 3 children - she is now postmenopausal - no galactorrhea - no osteoporosis  PLAN: 1. MNG  - I reviewed the images of her  thyroid ultrasound along with the patient. I pointed out that the left-sided cyst is not very large, but filled with fluid, which can cause compression symptoms. The report reads peripheral calcifications, however the appearance of this bright spots and the fact that they appear in a fluid-filled nodule points towards more benign etiology, of coagulated colloid. I pointed out benign features of the 3 nodules, and the fact that I do not see any ominent signs in any of them. The nodules are either cystic or hyperechoic, without calcifications, without internal blood flow, more wide than tall, and well delimited from surrounding tissue. Pt does have a thyroid cancer family history, which does increase her risk of thyroid cancer, however this is not substantiated on the thyroid ultrasound. She does not have a personal history of RxTx to head/neck.  - I  explained the fact that, if the largest left-sided colloid cyst is causing her symptoms, we can definitely aspirate it, if not, we can just follow it for growth over time. She agreed to continue to follow it over time. I advised her to let me know to my chart if she becomes more symptomatic with neck impression symptoms, if not we will schedule another appointment for evaluation in a year from now at that time, if there are signs of enlargement of the cyst, we can order another ultrasound, otherwise we would repeat this in 2 years from now - we discussed about the fact that if, after drainage, the cyst keeps reforming and causing her symptoms, we might need to do a lobectomy. I did explain that, while thyroid surgery is not a complicated one, it still can have side effects and also she might have a risk of ~24% of becoming hypothyroid after hemithyroidectomy.   - since she did not have thyroid tests checked in the last 5 months, we will check them today - I'll see her back in a year  2. History of microadenoma - will check a prolactin - there is no clear  indication of treating a slightly higher prolactin in a postmenopausal woman unless she has symptoms. However, if her prolactin is elevated, we might need to obtain an MRI of her pituitary gland to ensure that she does not have an enlarged microprolactinoma. We also will probably need to check for the rest of her pituitary hormones.  Orders Placed This Encounter  Procedures  . Prolactin  . TSH  . T4, free  . T3, free   Office Visit on 02/26/2013  Component Date Value Range Status  . TSH 02/26/2013 0.63  0.35 - 5.50 uIU/mL Final  . Free T4 02/26/2013 0.85  0.60 - 1.60 ng/dL Final  . T3, Free 09/81/1914 2.6  2.3 - 4.2 pg/mL Final  TFTs normal.  PRL 76 >> will advise pt to return for pituitary eval. And a pituitary MRI. If all normal or microadenoma >> can just watch w/o tx.  03/09/2013 CLINICAL DATA: 57 year old female with history of elevated  prolactin level years ago. Successfully treated with medical  therapy. Recurrent elevated prolactin. Left-sided hearing loss.  EXAM:   MRI HEAD WITHOUT AND WITH CONTRAST   TECHNIQUE:  Multiplanar, multiecho pulse sequences of the brain and surrounding  structures were obtained according to standard protocol without and  with intravenous contrast   CONTRAST: 9mL MULTIHANCE GADOBENATE DIMEGLUMINE 529 MG/ML IV SOLN  COMPARISON: 08/06/2004.   FINDINGS:  Cerebral volume is within normal limits for age. Incidental Mild  scaphocephaly again noted. No restricted diffusion to suggest acute  infarction. No midline shift, mass effect, evidence of mass lesion,  ventriculomegaly, extra-axial collection or acute intracranial  hemorrhage. Cervicomedullary junction within normal limits. Negative  visualized cervical spine. Major intracranial vascular flow voids  are within normal limits. Stable and normal for age gray and white  matter signal throughout the brain. No abnormal gray or white matter  enhancement.  Interval postoperative changes to the  right globe. Visible internal  auditory structures appear normal. Visualized paranasal sinuses and  mastoids are clear. Normal bone marrow signal. Visualized scalp soft  tissues are within normal limits.  Dedicated pituitary imaging. Mildly decreased pituitary volume since  2006. Overall pituitary size within normal limits for age. Normal  infundibulum. Normal hypothalamus. Normal cavernous sinus. No  suprasellar mass or mass effect.  However, there is hypo enhancing soft tissue which appears  to extend  inferiorly from the left central pituitary gland a short distance  down into the clivus measuring about 6 mm in diameter (series 3,  image 6, series 9, image 5). Surrounding bone marrow signal is  normal. This area is not well visualized on dynamic images due to  skullbase artifact, but does hypoenhance. No other abnormal  enhancement.   IMPRESSION:  1. Findings suspicious for a 6 mm focus of hypo enhancing soft  tissue arising somewhat exophyticly from the caudal aspect of the  gland, suspicious for a microadenoma in this setting. 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  2. Otherwise normal pituitary imaging.  3. Otherwise normal for age MRI appearance of the brain.   Electronically Signed  By: Augusto Gamble M.D.  On: 03/09/2013 09:37  Pituitary microadenoma. Along with HPRL and OTW normal pituitary labs >> consistent with the h/o microprolactinoma. I will continue to follow it, but no further testing or tx for now.

## 2013-02-27 LAB — PROLACTIN: Prolactin: 76 ng/mL

## 2013-03-01 ENCOUNTER — Telehealth: Payer: Self-pay | Admitting: *Deleted

## 2013-03-01 ENCOUNTER — Encounter: Payer: Self-pay | Admitting: Internal Medicine

## 2013-03-01 NOTE — Telephone Encounter (Signed)
Called pt and advised her of her labs. Advised her thyroid tests are normal the prolactin is a little bit high, and ask her to return around 8 AM for further pituitary labs. I also ordered a pituitary MRI, as I discussed with her that we might need to do, to further evaluate her previous prolactinoma.Pt understood. Advised pt that I am mailing her a copy of her labs as well.

## 2013-03-02 ENCOUNTER — Other Ambulatory Visit: Payer: Self-pay | Admitting: *Deleted

## 2013-03-02 ENCOUNTER — Other Ambulatory Visit (INDEPENDENT_AMBULATORY_CARE_PROVIDER_SITE_OTHER): Payer: BC Managed Care – PPO

## 2013-03-02 DIAGNOSIS — D352 Benign neoplasm of pituitary gland: Secondary | ICD-10-CM

## 2013-03-02 LAB — LUTEINIZING HORMONE: LH: 35.53 m[IU]/mL

## 2013-03-03 ENCOUNTER — Encounter: Payer: Self-pay | Admitting: Internal Medicine

## 2013-03-03 LAB — ACTH: C206 ACTH: 21 pg/mL (ref 10–46)

## 2013-03-08 ENCOUNTER — Ambulatory Visit
Admission: RE | Admit: 2013-03-08 | Discharge: 2013-03-08 | Disposition: A | Discharge status: Home / Self Care | Payer: No Typology Code available for payment source | Source: Ambulatory Visit | Attending: Internal Medicine | Admitting: Internal Medicine

## 2013-03-08 DIAGNOSIS — D352 Benign neoplasm of pituitary gland: Secondary | ICD-10-CM

## 2013-03-08 MED ORDER — GADOBENATE DIMEGLUMINE 529 MG/ML IV SOLN
9.0000 mL | Freq: Once | INTRAVENOUS | Status: AC | PRN
  Administered 2013-03-08: 9 mL via INTRAVENOUS

## 2013-03-09 ENCOUNTER — Encounter: Payer: Self-pay | Admitting: Internal Medicine

## 2014-03-04 ENCOUNTER — Ambulatory Visit (INDEPENDENT_AMBULATORY_CARE_PROVIDER_SITE_OTHER): Payer: No Typology Code available for payment source | Admitting: Internal Medicine

## 2014-03-04 ENCOUNTER — Encounter: Payer: Self-pay | Admitting: Internal Medicine

## 2014-03-04 VITALS — BP 118/64 | HR 78 | Temp 98.2°F | Resp 12 | Wt 197.0 lb

## 2014-03-04 DIAGNOSIS — E042 Nontoxic multinodular goiter: Secondary | ICD-10-CM

## 2014-03-04 DIAGNOSIS — D352 Benign neoplasm of pituitary gland: Secondary | ICD-10-CM

## 2014-03-04 LAB — T3, FREE: T3, Free: 2.5 pg/mL (ref 2.3–4.2)

## 2014-03-04 LAB — TSH: TSH: 0.39 u[IU]/mL (ref 0.35–4.50)

## 2014-03-04 LAB — T4, FREE: FREE T4: 0.87 ng/dL (ref 0.60–1.60)

## 2014-03-04 NOTE — Patient Instructions (Signed)
Please stop at the lab.  Please return in 1 year.  

## 2014-03-04 NOTE — Progress Notes (Signed)
Patient ID: Crystal Bauer, female   DOB: [birth year], 58 y.o.   MRN: [id number]   HPI  Crystal Bauer is a 58 y.o.-year-old female, returning for f/u for MNG. Incidentally, she also has a history of microprolactinoma, not on treatment for the last 14 years.  MNG: Reviewed hx: Patient complained of an enlarging left lower neck mass which was palpated at the last visit with her PCP.  Thyroid U/S on 02/04/2013 showed bilateral thyroid nodules, small, less than 5 mm in the right lobe, and 3 larger nodules in the left lobe. The largest lesion is a 1.7 x 1.0 x 1.3 cm cyst situated in the left upper lobe, with post-acoustic shadowing, and colloid particles on the periphery.  I reviewed pt's thyroid tests: Lab Results  Component Value Date   TSH 0.63 02/26/2013   TSH 0.86 10/01/2012   FREET4 0.85 02/26/2013    Pt describes + hoarseness, but has dysphagia for food, not with saliva. No odynophagia, SOB with lying down.  Pt c/o: - no palpitations  - no heat intolerance/cold intolrance - resolved mild tremors - no anxiety/depression - no fatigue - no hyperdefecation/constipation - + weight gain  Micro-prolactinoma: - dx in 1992, status post treatment with Cabergoline up to 2000.  PRL in ~2004 minimally elevated.   At last visit, we checked pituitary hh and they were normal, except elevated PRL: Component     Latest Ref Rng 02/26/2013  Prolactin      76.0  TSH     0.35 - 5.50 uIU/mL 0.63  Free T4     0.60 - 1.60 ng/dL 0.85  T3, Free     2.3 - 4.2 pg/mL 2.6   Somatomedin (IGF-I)     31 - 179 ng/mL 119  C206 ACTH     10 - 46 pg/mL 21  Cortisol, Plasma      12.6  FSH      83.9  LH      35.53   MRI (03/09/2014): microadenoma, 6 mm.  She is now postmenopausal and does not complain of galactorrhea, headaches, visual problems, therefore we decided to follow the PRL levels and not intervene.  ROS: Constitutional: no weight gain/loss, no fatigue, no subjective  hyperthermia/hypothermia Eyes: no blurry vision, no xerophthalmia ENT: no sore throat,no nodules palpated in throat, no dysphagia/odynophagia, no hoarseness Cardiovascular: no CP/SOB/palpitations/leg swelling Respiratory: no cough/SOB Gastrointestinal: no N/V/D/C Musculoskeletal: no muscle/joint aches Skin: no rashes Neurological: no tremors/numbness/tingling/dizziness  PE: BP 118/64  Pulse 78  Temp(Src) 98.2 F (36.8 C) (Oral)  Resp 12  Wt 197 lb (89.359 kg)  SpO2 97% Wt Readings from Last 3 Encounters:  03/04/14 197 lb (89.359 kg)  02/26/13 186 lb 9.6 oz (84.641 kg)  01/28/13 190 lb 12.8 oz (86.546 kg)   Constitutional: overweight, in NAD Eyes: PERRLA, EOMI, no exophthalmos ENT: moist mucous membranes, no thyromegaly or nodules palpated, no cervical lymphadenopathy Cardiovascular: RRR, + 1/6 SEM Respiratory: CTA B Gastrointestinal: abdomen soft, NT, ND, BS+ Musculoskeletal: no deformities, strength intact in all 4;  Skin: moist, warm, no rashes Neurological: mild tremor with outstretched hands, DTR normal in all 4  ASSESSMENT: 1. MNG - thyroid U/S 02/04/2013:  Right thyroid lobe - 4.2 x 1.5 x 1.1 cm. Only small right thyroid nodules are present of no more than 5 mm in diameter.   Left thyroid lobe - 4.9 x 2.1 x 1.9 cm. Multiple left thyroid nodules are present. The largest is complex but primarily cystic in the upper  pole of the left lobe measuring 1.7 x 1.0 x 1.3 cm. Peripheral calcifications are noted within this cystic lesion. (these do appear to be not calcifications but inspissated colloid due to presence of comet tail artifact. Nodules in the lower pole of the left lobe measure 1.2 x 0.7 x 1.1 cm, and 1.0 x 0.8 x 1.8 cm. One of them is a cyst.  Isthmus  -  Thickness: 3 mm in thickness. No nodules visualized.   Lymphadenopathy  - None visualized.  2. History of microprolactinoma - dx 1992 - previously on cabergoline until 2000, when she took herself off it due to  palpitations - last prolactin check was ~2004, when it was mildly elevated - has 3 children - she is now postmenopausal - no galactorrhea - no osteoporosis - MRI (03/09/2014): 1. Findings suspicious for a 6 mm focus of hypo enhancing soft  tissue arising somewhat exophyticly from the caudal aspect of the  gland, suspicious for a microadenoma in this setting. 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  2. Otherwise normal pituitary imaging.  3. Otherwise normal for age MRI appearance of the brain.    PLAN: 1. MNG  - I reviewed the report of her last thyroid ultrasound along with the patient. She has a left-sided cyst with coagulated colloid. She feels this is smaller c/w last year, which is possible 2/2 resorption of the colloid inside the cyst. - will repeat Thyroid U/S when she comes back next year  - we will check TFTs today - I'll see her back in a year  2. Microprolactinoma - will check a prolactin level, last was 76 1 year ago - there is no clear indication of treating a slightly higher prolactin in a postmenopausal woman unless she has symptoms.  - MRI of the pituitary shows a small microprolactinoma and her pituitary hh are normal >> no need to repeat the MRI or HH evaluation in the future unless the PRL trending up  Office Visit on 03/04/2014  Component Date Value Ref Range Status  . Prolactin 03/04/2014 99.8   Final   Comment:      Reference Ranges:                                           Female:                       2.1 -  17.1 ng/ml                                           Female:   Pregnant          9.7 - 208.5 ng/mL                                                     Non Pregnant      2.8 -  29.2 ng/mL  Post Menopausal   1.8 -  20.3 ng/mL                                              . TSH 03/04/2014 0.39  0.35 - 4.50 uIU/mL Final  . Free T4 03/04/2014 0.87  0.60 -  1.60 ng/dL Final  . T3, Free 03/04/2014 2.5  2.3 - 4.2 pg/mL Final  TFTs normal. PRL a little higher, but since no sxs >> will continue to follow. If next year's PRL is higher >> may need repeat MRI and start tx.

## 2014-03-05 LAB — PROLACTIN: Prolactin: 99.8 ng/mL

## 2014-12-02 ENCOUNTER — Encounter: Payer: Self-pay | Admitting: Internal Medicine

## 2014-12-02 ENCOUNTER — Ambulatory Visit (INDEPENDENT_AMBULATORY_CARE_PROVIDER_SITE_OTHER): Payer: BLUE CROSS/BLUE SHIELD | Admitting: Internal Medicine

## 2014-12-02 VITALS — BP 112/60 | HR 83 | Temp 98.2°F | Resp 12 | Wt 201.0 lb

## 2014-12-02 DIAGNOSIS — E042 Nontoxic multinodular goiter: Secondary | ICD-10-CM | POA: Diagnosis not present

## 2014-12-02 DIAGNOSIS — D352 Benign neoplasm of pituitary gland: Secondary | ICD-10-CM

## 2014-12-02 MED ORDER — CABERGOLINE 0.5 MG PO TABS: 0.2500 mg | ORAL_TABLET | ORAL | Status: DC

## 2014-12-02 NOTE — Progress Notes (Signed)
Patient ID: Crystal Bauer, female   DOB: [birth year], 59 y.o.   MRN: [id number]   HPI  Crystal Bauer is a 59 y.o.-year-old female, returning for f/u for MNG and microprolactinoma, not on treatment for the last 14 years. Last visit 9 mo ago.  MNG: Reviewed hx: Patient complained of an enlarging left lower neck mass which was palpated at the last visit with her PCP.  Thyroid U/S on 02/04/2013 showed bilateral thyroid nodules, small, less than 5 mm in the right lobe, and 3 larger nodules in the left lobe. The largest lesion is a 1.7 x 1.0 x 1.3 cm cyst situated in the left upper lobe, with post-acoustic shadowing, and colloid particles on the periphery.  I reviewed pt's thyroid tests: Lab Results  Component Value Date   TSH 0.39 03/04/2014   TSH 0.63 02/26/2013   TSH 0.86 10/01/2012   FREET4 0.87 03/04/2014   FREET4 0.85 02/26/2013    Pt denies hoarseness, no dysphagia. No odynophagia, no SOB with lying down.  Pt c/o: - + weight gain - + milky breast discharge L breast - no palpitations  - no heat intolerance/cold intolrance - no tremors - no anxiety/depression - no fatigue - no hyperdefecation/constipation   Micro-prolactinoma: - dx in 1992, status post treatment with Cabergoline up to 2000.  PRL in ~2004 minimally elevated.   At last visit, we checked pituitary hh and they were normal, except elevated PRL - we did not restart Cabergoline, but we are watching the PRL level: Component     Latest Ref Rng 02/26/2013 03/04/2014  Prolactin      76.0 99.8   She started to have L breast milky discharge 1 mo ago. + breast pain (lump).   Previous pituitary labs were normal, also: Somatomedin (IGF-I)     31 - 179 ng/mL 119  C206 ACTH     10 - 46 pg/mL 21  Cortisol, Plasma      12.6  FSH      83.9  LH      35.53   MRI (03/09/2014): microadenoma, 6 mm.  She is now postmenopausal and does not complain of galactorrhea, headaches, visual problems, therefore we  decided to follow the PRL levels and not intervene.  ROS: Constitutional: + weight gain, no fatigue, no subjective hyperthermia/hypothermia Eyes: no blurry vision, no xerophthalmia ENT: no sore throat,no nodules palpated in throat, no dysphagia/odynophagia, no hoarseness Cardiovascular: no CP/SOB/palpitations/leg swelling Respiratory: no cough/SOB Gastrointestinal: no N/V/D/C, + heartburn Musculoskeletal: no muscle/joint aches Skin: no rashes Neurological: no tremors/numbness/tingling/dizziness  I reviewed pt's medications, allergies, PMH, social hx, family hx, and changes were documented in the history of present illness. Otherwise, unchanged from my initial visit note.Stopped [place].  PE: BP 112/60 mmHg  Pulse 83  Temp(Src) 98.2 F (36.8 C) (Oral)  Resp 12  Wt 201 lb (91.173 kg)  SpO2 95% Wt Readings from Last 3 Encounters:  12/02/14 201 lb (91.173 kg)  03/04/14 197 lb (89.359 kg)  02/26/13 186 lb 9.6 oz (84.641 kg)   Constitutional: overweight, in NAD Eyes: PERRLA, EOMI, no exophthalmos ENT: moist mucous membranes, no thyromegaly or nodules palpated, no cervical lymphadenopathy Cardiovascular: RRR, + 1/6 SEM Respiratory: CTA B Gastrointestinal: abdomen soft, NT, ND, BS+ Musculoskeletal: no deformities, strength intact in all 4;  Skin: moist, warm, no rashes Neurological: mild tremor with outstretched hands, DTR normal in all 4  ASSESSMENT: 1. MNG - thyroid U/S 02/04/2013:  Right thyroid lobe - 4.2 x 1.5 x 1.1 cm.  Only small right thyroid nodules are present of no more than 5 mm in diameter.   Left thyroid lobe - 4.9 x 2.1 x 1.9 cm. Multiple left thyroid nodules are present. The largest is complex but primarily cystic in the upper pole of the left lobe measuring 1.7 x 1.0 x 1.3 cm. Peripheral calcifications are noted within this cystic lesion. (these do appear to be not calcifications but inspissated colloid due to presence of comet tail artifact. Nodules in the lower  pole of the left lobe measure 1.2 x 0.7 x 1.1 cm, and 1.0 x 0.8 x 1.8 cm. One of them is a cyst.  Isthmus  -  Thickness: 3 mm in thickness. No nodules visualized.   Lymphadenopathy  - None visualized.  2. History of microprolactinoma - dx 1992 - previously on cabergoline until 2000, when she took herself off it due to palpitations - prolactin check in ~2004 was mildly elevated - has 3 children - she is now postmenopausal - no osteoporosis - MRI (03/09/2014): 1. Findings suspicious for a 6 mm focus of hypo enhancing soft  tissue arising somewhat exophyticly from the caudal aspect of the  gland, suspicious for a microadenoma in this setting. 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  2. Otherwise normal pituitary imaging.  3. Otherwise normal for age MRI appearance of the brain.    PLAN: 1. MNG  - I reviewed the report of her last thyroid ultrasound along with the patient. She has a left-sided cyst with coagulated colloid. She again feels this is smaller c/w last year, which is possible 2/2 resorption of the colloid inside the cyst. - will repeat Thyroid U/S in 1-2 years - we will check a TSH today - I'll see her back in a year  2. Microprolactinoma - will check a prolactin level, last was 99 << 76 - there is no clear indication of treating a slightly higher prolactin in a postmenopausal woman unless she has symptoms. She developed breast tenderness and galactorrhea in L breast >> will start cabergoline  - will check a PRL today and in 3 mo after starting tx - MRI of the pituitary shows a small microprolactinoma and her pituitary hh are normal >> no need to repeat the MRI or HH evaluation in the future unless the PRL not improving on tx  Patient Instructions  Please stop at the lab.  Please come back for another Prolactin level in 3 months.  Start Cabergoline 0.25 mg 2x a week for 4 weeks, then switch to 1x a week.  Please  return in 1 year.   Component     Latest Ref Rng 02/26/2013 03/04/2014 12/02/2014  TSH     0.35 - 4.50 uIU/mL 0.63 0.39 1.10  Prolactin      76.0 99.8 122.2  Prolactin increasing. Continue with the plan to start cabergoline as mentioned above.

## 2014-12-02 NOTE — Patient Instructions (Signed)
Please stop at the lab.  Please come back for another Prolactin level in 3 months.  Start Cabergoline 0.25 mg 2x a week for 4 weeks, then switch to 1x a week.  Please return in 1 year.

## 2014-12-05 LAB — TSH: TSH: 1.1 u[IU]/mL (ref 0.35–4.50)

## 2014-12-06 LAB — PROLACTIN: Prolactin: 122.2 ng/mL

## 2014-12-13 ENCOUNTER — Encounter: Payer: Self-pay | Admitting: *Deleted

## 2015-01-15 ENCOUNTER — Ambulatory Visit (INDEPENDENT_AMBULATORY_CARE_PROVIDER_SITE_OTHER): Payer: BLUE CROSS/BLUE SHIELD | Admitting: Physician Assistant

## 2015-01-15 VITALS — BP 112/68 | HR 80 | Temp 99.1°F | Resp 18 | Ht 68.0 in | Wt 201.2 lb

## 2015-01-15 DIAGNOSIS — L03818 Cellulitis of other sites: Secondary | ICD-10-CM | POA: Diagnosis not present

## 2015-01-15 MED ORDER — CEPHALEXIN 500 MG PO CAPS: 500.0000 mg | ORAL_CAPSULE | Freq: Four times a day (QID) | ORAL | Status: DC

## 2015-01-15 NOTE — Progress Notes (Addendum)
   Subjective:    Patient ID: Crystal Bauer, female    DOB: March 08, 1956, 59 y.o.   MRN: [id number]  Chief Complaint  Patient presents with  . Infected Spot On Breast    Left breast. Has a hard spot that is warm, red, painful, and leaking a green discharge.    Medications, allergies, past medical history, surgical history, family history, social history and problem list reviewed and updated.  HPI  59 yof presents with above sx.   Has hx left breast cyst which she was pressing on and thinks she irritated the area 5-6 days ago. Left breast has been warm and red past 4-5 days. Small amnt green dc from left nipple past few days which has resolved today. Denies fevers, chills.   She is due to see her gyn in 4 days. Last had mammo 2014 which was normal per pt. Denies hx breast ca. 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   Review of Systems See HPI     Objective:   Physical Exam  Constitutional: She is oriented to person, place, and time. She appears well-developed and well-nourished.  Non-toxic appearance. She does not have a sickly appearance. She does not appear ill. No distress.  BP 112/68 mmHg  Pulse 80  Temp(Src) 99.1 F (37.3 C) (Oral)  Resp 18  Ht 5\' 8"  (1.727 m)  Wt 201 lb 3.2 oz (91.264 kg)  BMI 30.60 kg/m2  SpO2 99%   Pulmonary/Chest: Left breast exhibits skin change and tenderness. Left breast exhibits no inverted nipple and no nipple discharge.    Left breast with erythema and induration over circled area. No fluctuance.   Neurological: She is alert and oriented to person, place, and time.      Assessment & Plan:   Cellulitis of other specified site - Plan: cephALEXin (KEFLEX) 500 MG capsule --no fluctuance today to suggest I&D --keflex 500 qid 10 days for cellulitis, pt is seeing her gyn in 4 days, encouraged to keep this appt for f/u to monitor area and she is also due for mammo --heat to area at least tid --rtc prior to thurs if sx  worsen  Julieta Gutting, PA-C Physician Assistant-Certified [facility]  01/15/2015 12:42 PM  I have participated in the care of this patient with the Advanced Practice Provider--this area of induration and redness is consistent with a cellulitis but not yet ready for incision and drainage. It has been manipulated by the patient and should be an etiology other than MRSA.  I agree with Diagnosis and Plan as documented. Robert P. Laney Pastor, M.D.

## 2015-01-15 NOTE — Patient Instructions (Signed)
Please take the keflex 500 mg 4 times daily for 10 days.  Please be sure to see your gynecologist on Sept 1st as scheduled.  Be sure to apply heat at least three times per day to the area!  If it starts worsening between now and Thursday please come back to see [place].   Cellulitis Cellulitis is an infection of the skin and the tissue beneath it. The infected area is usually red and tender. Cellulitis occurs most often in the arms and lower legs.  CAUSES  Cellulitis is caused by bacteria that enter the skin through cracks or cuts in the skin. The most common types of bacteria that cause cellulitis are staphylococci and streptococci. SIGNS AND SYMPTOMS   Redness and warmth.  Swelling.  Tenderness or pain.  Fever. DIAGNOSIS  Your health care provider can usually determine what is wrong based on a physical exam. Blood tests may also be done. TREATMENT  Treatment usually involves taking an antibiotic medicine. HOME CARE INSTRUCTIONS   Take your antibiotic medicine as directed by your health care provider. Finish the antibiotic even if you start to feel better.  Keep the infected arm or leg elevated to reduce swelling.  Apply a warm cloth to the affected area up to 4 times per day to relieve pain.  Take medicines only as directed by your health care provider.  Keep all follow-up visits as directed by your health care provider. SEEK MEDICAL CARE IF:   You notice red streaks coming from the infected area.  Your red area gets larger or turns dark in color.  Your bone or joint underneath the infected area becomes painful after the skin has healed.  Your infection returns in the same area or another area.  You notice a swollen bump in the infected area.  You develop new symptoms.  You have a fever. SEEK IMMEDIATE MEDICAL CARE IF:   You feel very sleepy.  You develop vomiting or diarrhea.  You have a general ill feeling (malaise) with muscle aches and pains. MAKE SURE YOU:    Understand these instructions.  Will watch your condition.  Will get help right away if you are not doing well or get worse. Document Released: 02/13/2005 Document Revised: 09/20/2013 Document Reviewed: 07/22/2011 [facility] Patient Information 2015 [place], [place]. This information is not intended to replace advice given to you by your health care provider. Make sure you discuss any questions you have with your health care provider.

## 2015-01-24 ENCOUNTER — Other Ambulatory Visit: Payer: Self-pay | Admitting: Obstetrics and Gynecology

## 2015-01-24 DIAGNOSIS — N6320 Unspecified lump in the left breast, unspecified quadrant: Secondary | ICD-10-CM

## 2015-01-30 ENCOUNTER — Ambulatory Visit
Admission: RE | Admit: 2015-01-30 | Discharge: 2015-01-30 | Disposition: A | Discharge status: Home / Self Care | Payer: BLUE CROSS/BLUE SHIELD | Source: Ambulatory Visit | Attending: Obstetrics and Gynecology | Admitting: Obstetrics and Gynecology

## 2015-01-30 ENCOUNTER — Other Ambulatory Visit: Payer: Self-pay | Admitting: Obstetrics and Gynecology

## 2015-01-30 DIAGNOSIS — N6320 Unspecified lump in the left breast, unspecified quadrant: Secondary | ICD-10-CM

## 2015-01-31 ENCOUNTER — Ambulatory Visit
Admission: RE | Admit: 2015-01-31 | Discharge: 2015-01-31 | Disposition: A | Discharge status: Home / Self Care | Payer: BLUE CROSS/BLUE SHIELD | Source: Ambulatory Visit | Attending: Obstetrics and Gynecology | Admitting: Obstetrics and Gynecology

## 2015-01-31 DIAGNOSIS — N6320 Unspecified lump in the left breast, unspecified quadrant: Secondary | ICD-10-CM

## 2015-02-04 LAB — CULTURE, ROUTINE-ABSCESS
CULTURE: NO GROWTH
Special Requests: NORMAL

## 2015-03-06 ENCOUNTER — Other Ambulatory Visit: Payer: BLUE CROSS/BLUE SHIELD

## 2015-03-06 ENCOUNTER — Other Ambulatory Visit: Payer: Self-pay | Admitting: *Deleted

## 2015-03-06 ENCOUNTER — Ambulatory Visit: Payer: No Typology Code available for payment source | Admitting: Internal Medicine

## 2015-03-06 ENCOUNTER — Other Ambulatory Visit (INDEPENDENT_AMBULATORY_CARE_PROVIDER_SITE_OTHER): Payer: BLUE CROSS/BLUE SHIELD

## 2015-03-06 DIAGNOSIS — E042 Nontoxic multinodular goiter: Secondary | ICD-10-CM

## 2015-03-06 DIAGNOSIS — D352 Benign neoplasm of pituitary gland: Secondary | ICD-10-CM

## 2015-03-06 LAB — T4, FREE: Free T4: 0.78 ng/dL (ref 0.60–1.60)

## 2015-03-06 LAB — T3, FREE: T3 FREE: 2.9 pg/mL (ref 2.3–4.2)

## 2015-03-06 LAB — TSH: TSH: 0.63 u[IU]/mL (ref 0.35–4.50)

## 2015-03-07 ENCOUNTER — Other Ambulatory Visit: Payer: Self-pay | Admitting: Physician Assistant

## 2015-03-07 ENCOUNTER — Other Ambulatory Visit: Payer: Self-pay | Admitting: Internal Medicine

## 2015-03-07 DIAGNOSIS — D352 Benign neoplasm of pituitary gland: Secondary | ICD-10-CM

## 2015-03-07 LAB — PROLACTIN: PROLACTIN: 57.1 ng/mL — AB (ref 4.8–23.3)

## 2015-03-07 MED ORDER — CABERGOLINE 0.5 MG PO TABS: 0.2500 mg | ORAL_TABLET | ORAL | Status: DC

## 2015-03-31 ENCOUNTER — Other Ambulatory Visit: Payer: Self-pay | Admitting: Obstetrics and Gynecology

## 2015-03-31 DIAGNOSIS — E2839 Other primary ovarian failure: Secondary | ICD-10-CM

## 2015-06-25 ENCOUNTER — Other Ambulatory Visit: Payer: Self-pay | Admitting: Internal Medicine

## 2015-07-07 ENCOUNTER — Other Ambulatory Visit: Payer: BLUE CROSS/BLUE SHIELD

## 2015-07-07 DIAGNOSIS — D352 Benign neoplasm of pituitary gland: Secondary | ICD-10-CM

## 2015-07-08 LAB — PROLACTIN: PROLACTIN: 13.4 ng/mL

## 2015-07-10 ENCOUNTER — Encounter: Payer: Self-pay | Admitting: Internal Medicine

## 2015-10-13 IMAGING — MG MM DIAG BREAST TOMO BILATERAL
6 of 9 series · 6 of 25 positions shown · non-contrast
Comparison: 10/15/2012

CLINICAL DATA: Patient presents with a tender left breast lump,
retroareolar/periareolar. She states the recently, she felt a
retroareolar cyst and "Popped it " manually. She states she had some
green nipple discharge about that time. Afterward, she states that
the palpable area enlarged and her skin became erythematous and the
area was more tender. She has just completed a course of Keflex for
presumed infection and was sent for evaluation today. She says that
the skin erythema and tenderness have improved with Keflex.

EXAM:
DIGITAL DIAGNOSTIC LEFT MAMMOGRAM WITH 3D TOMOSYNTHESIS WITH CAD
ULTRASOUND LEFT BREAST

[L TAN]
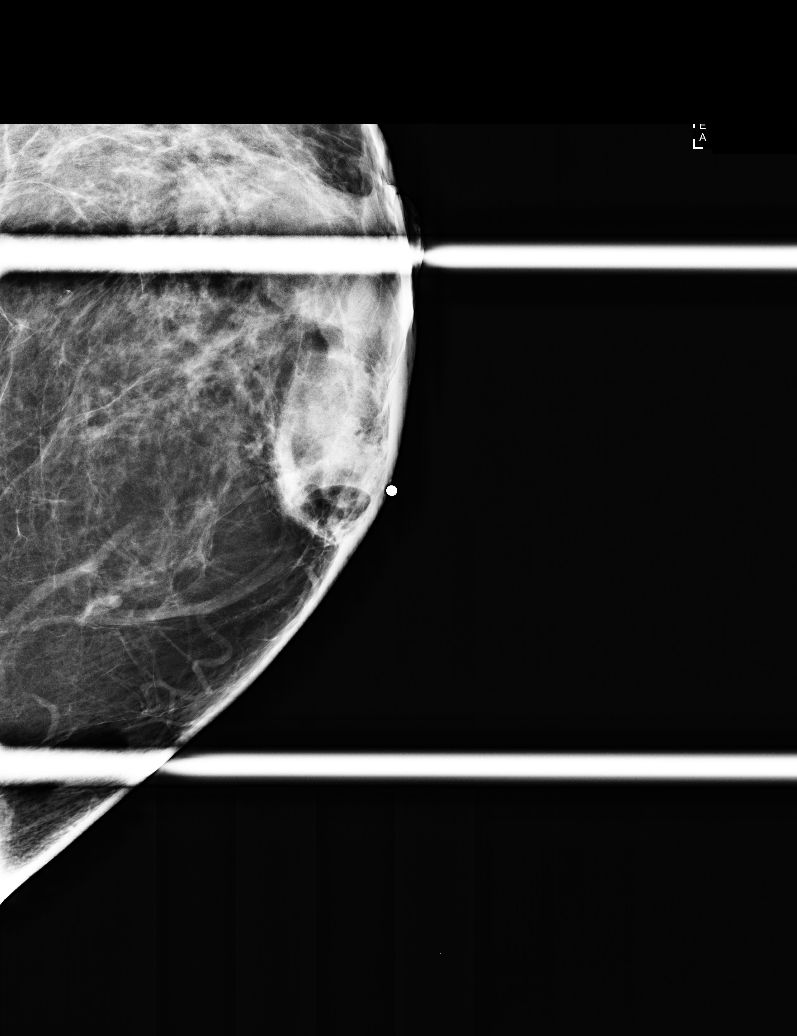

[R MLO]
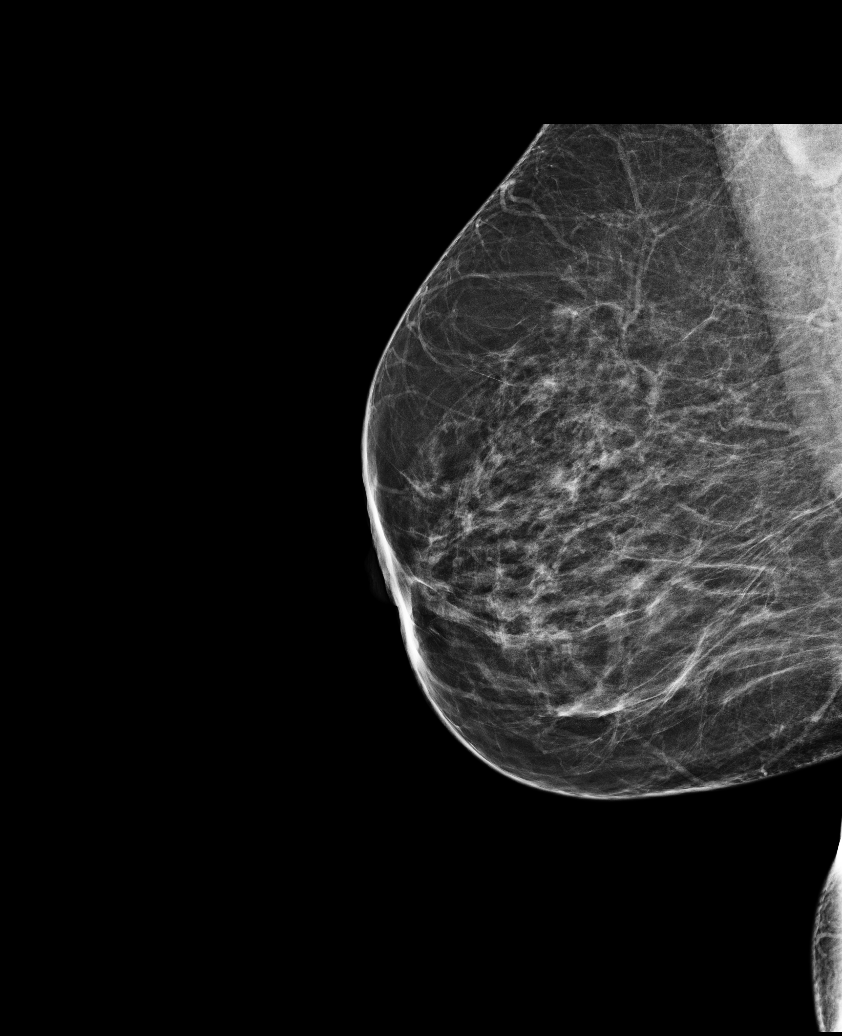

[L CC]
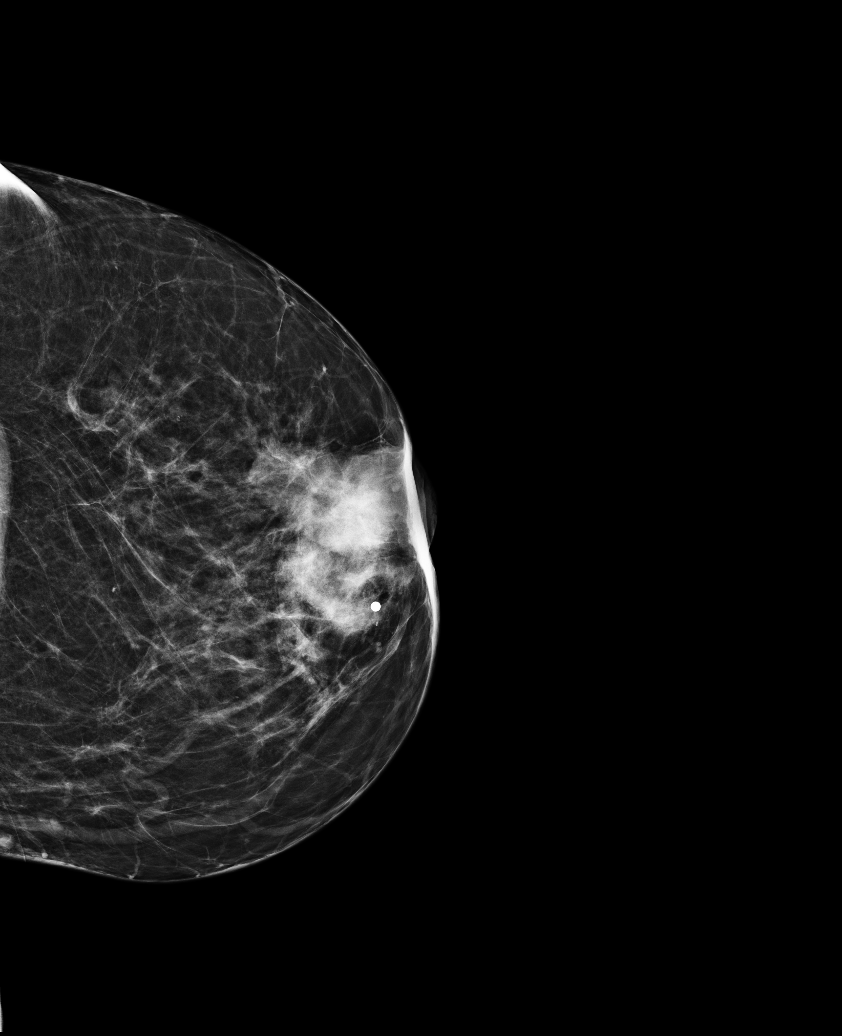

[R CC]
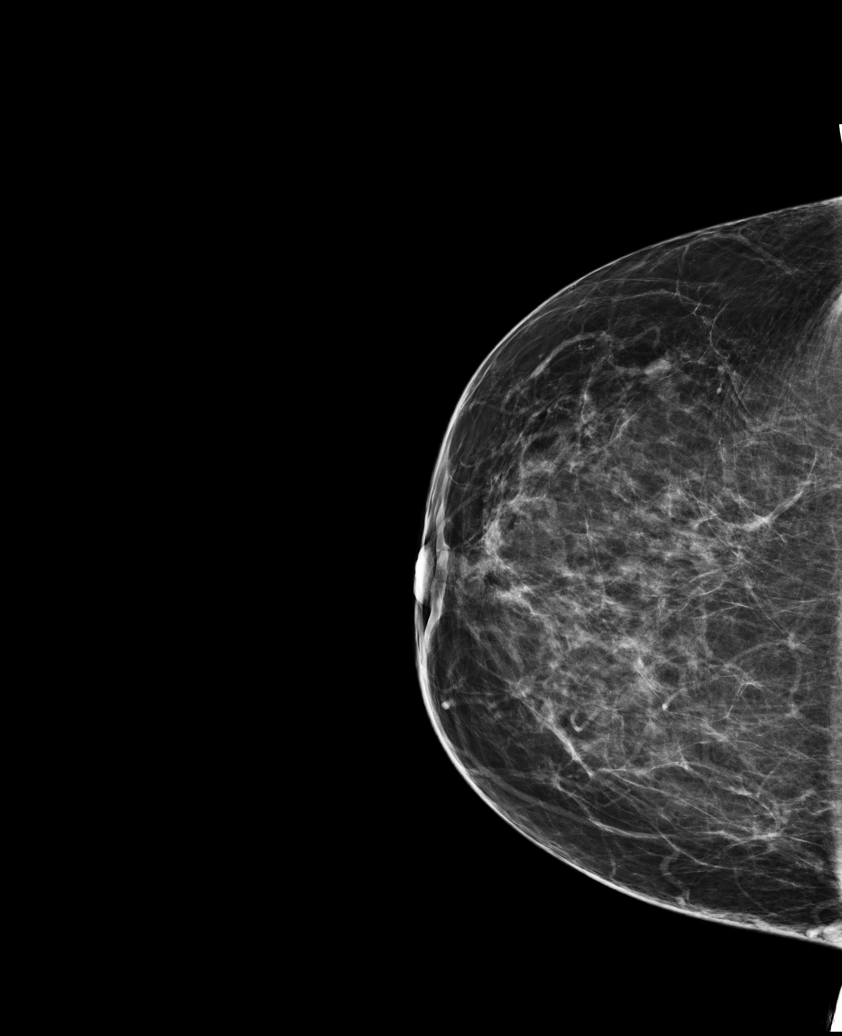

[L MLO]
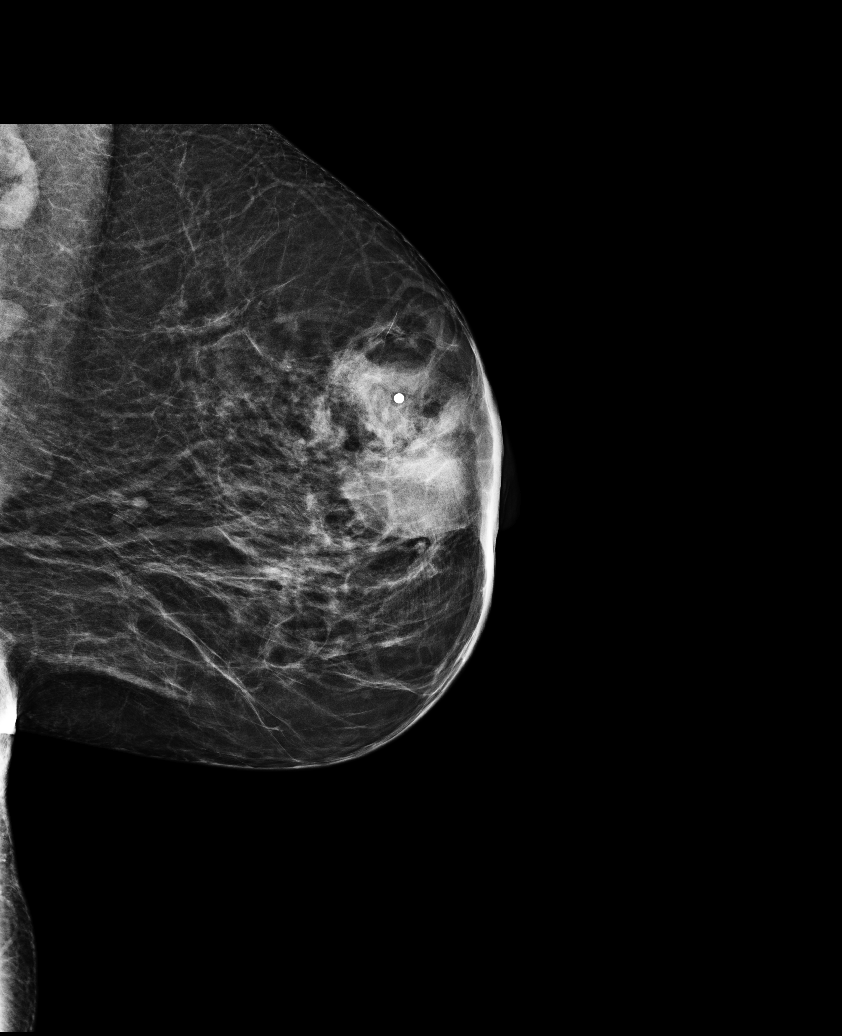

[R CC tomo · tomo slice 38/75.0]
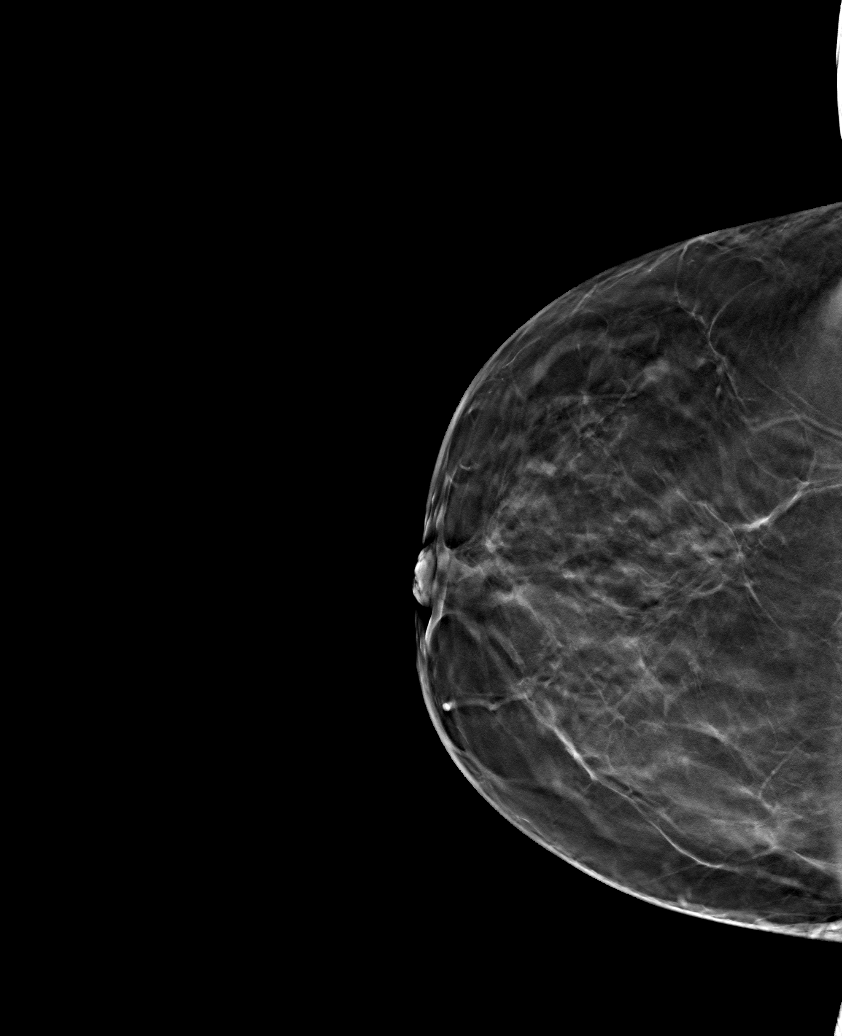

[6 of 25 positions shown; findings below may reference images not displayed]

ACR Breast Density Category b: There are scattered areas of
fibroglandular density.
FINDINGS: There is a retroareolar left breast mass that corresponds to the
palpable area of concern. By mammography this mass measures
approximately 4.5 x 4.8 x 3.9 cm. The periareolar left breast skin
is slightly thickened. The remainder of the left breast appears
normal. No distortion or suspicious microcalcification.

No findings suspicious for malignancy on the right. No suspicious
microcalcification, mass, or distortion.

Mammographic images were processed with CAD.

On physical exam, the skin of the 12 o'clock periareolar left breast
is slightly darker/erythematous compared to the remainder of the
skin of the left breast. There is a palpable mass in the region of
skin changes measuring approximately 7 cm. No discharge is elicited
on physical exam today.

Targeted ultrasound is performed, showing a complex fluid collection
with mobile internal echoes is seen in the [DATE] retroareolar left
breast. This extends laterally and somewhat inferiorly to the 3-4
o'clock region. This fluid collection measures at least 6.8 x 3.7 x
1.8 cm, but the overall size is likely underestimated as the entire
collection is not included on the ultrasound screen in the longest
dimension.
IMPRESSION: 1. Complex fluid collection retroareolar left breast consistent with
an abscess. The patient has completed antibiotic therapy, with
improvement of her symptoms.
2. No evidence of malignancy in the right breast.

RECOMMENDATION:
Ultrasound-guided percutaneous aspiration of the complex fluid
collection of the left breast is suggested and has been scheduled
for tomorrow, 01/31/2015.

I have discussed the findings and recommendations with the patient.
Results were also provided in writing at the conclusion of the
visit. If applicable, a reminder letter will be sent to the patient
regarding the next appointment.

BI-RADS CATEGORY  2: Benign.

## 2015-12-23 ENCOUNTER — Ambulatory Visit (INDEPENDENT_AMBULATORY_CARE_PROVIDER_SITE_OTHER): Payer: BLUE CROSS/BLUE SHIELD | Admitting: Urgent Care

## 2015-12-23 VITALS — BP 116/64 | HR 83 | Temp 98.7°F | Resp 16 | Ht 68.5 in | Wt 201.4 lb

## 2015-12-23 DIAGNOSIS — R42 Dizziness and giddiness: Secondary | ICD-10-CM

## 2015-12-23 DIAGNOSIS — R5383 Other fatigue: Secondary | ICD-10-CM | POA: Diagnosis not present

## 2015-12-23 DIAGNOSIS — Z8679 Personal history of other diseases of the circulatory system: Secondary | ICD-10-CM

## 2015-12-23 DIAGNOSIS — I951 Orthostatic hypotension: Secondary | ICD-10-CM

## 2015-12-23 LAB — COMPREHENSIVE METABOLIC PANEL
ALT: 21 U/L (ref 6–29)
AST: 16 U/L (ref 10–35)
Albumin: 4.3 g/dL (ref 3.6–5.1)
Alkaline Phosphatase: 61 U/L (ref 33–130)
BILIRUBIN TOTAL: 0.4 mg/dL (ref 0.2–1.2)
BUN: 13 mg/dL (ref 7–25)
CHLORIDE: 107 mmol/L (ref 98–110)
CO2: 25 mmol/L (ref 20–31)
CREATININE: 0.84 mg/dL (ref 0.50–1.05)
Calcium: 9.5 mg/dL (ref 8.6–10.4)
GLUCOSE: 71 mg/dL (ref 65–99)
Potassium: 4.8 mmol/L (ref 3.5–5.3)
SODIUM: 142 mmol/L (ref 135–146)
Total Protein: 6.8 g/dL (ref 6.1–8.1)

## 2015-12-23 LAB — POCT CBC
GRANULOCYTE PERCENT: 62.1 % (ref 37–80)
HCT, POC: 40.6 % (ref 37.7–47.9)
HEMOGLOBIN: 14.4 g/dL (ref 12.2–16.2)
Lymph, poc: 2.7 (ref 0.6–3.4)
MCH: 32.7 pg — AB (ref 27–31.2)
MCHC: 35.5 g/dL — AB (ref 31.8–35.4)
MCV: 92.1 fL (ref 80–97)
MID (CBC): 0.7 (ref 0–0.9)
MPV: 7.6 fL (ref 0–99.8)
PLATELET COUNT, POC: 283 10*3/uL (ref 142–424)
POC Granulocyte: 5.7 (ref 2–6.9)
POC LYMPH PERCENT: 30 %L (ref 10–50)
POC MID %: 7.9 %M (ref 0–12)
RBC: 4.41 M/uL (ref 4.04–5.48)
RDW, POC: 13.9 %
WBC: 9.1 10*3/uL (ref 4.6–10.2)

## 2015-12-23 NOTE — Patient Instructions (Addendum)
Orthostatic Hypotension Orthostatic hypotension is a sudden drop in blood pressure. It happens when you quickly stand up from a seated or lying position. You may feel dizzy or light-headed. This can last for just a few seconds or for up to a few minutes. It is usually not a serious problem. However, if this happens frequently or gets worse, it can be a sign of something more serious. CAUSES  Different things can cause orthostatic hypotension, including:   Loss of body fluids (dehydration).  Medicines that lower blood pressure.  Sudden changes in posture, such as standing up quickly after you have been sitting or lying down.  Taking too much of your medicine. SIGNS AND SYMPTOMS   Light-headedness or dizziness.   Fainting or near-fainting.   A fast heart rate.   Weakness.   Feeling tired (fatigue).  DIAGNOSIS  Your health care provider may do several things to help diagnose your condition and identify the cause. These may include:   Taking a medical history and doing a physical exam.  Checking your blood pressure. Your health care provider will check your blood pressure when you are:  Lying down.  Sitting.  Standing.  Using tilt table testing. In this test, you lie down on a table that moves from a lying position to a standing position. You will be strapped onto the table. This test monitors your blood pressure and heart rate when you are in different positions. TREATMENT  Treatment will vary depending on the cause. Possible treatments include:   Changing the dosage of your medicines.  Wearing compression stockings on your lower legs.  Standing up slowly after sitting or lying down.  Eating more salt.  Eating frequent, small meals.  In some cases, getting IV fluids.  Taking medicine to enhance fluid retention. HOME CARE INSTRUCTIONS  Only take over-the-counter or prescription medicines as directed by your health care provider.  Follow your health care  provider's instructions for changing the dosage of your current medicines.  Do not stop or adjust your medicine on your own.  Stand up slowly after sitting or lying down. This allows your body to adjust to the different position.  Wear compression stockings as directed.  Eat extra salt as directed.  Do not add extra salt to your diet unless directed to by your health care provider.  Eat frequent, small meals.  Avoid standing suddenly after eating.  Avoid hot showers or excessive heat as directed by your health care provider.  Keep all follow-up appointments. SEEK MEDICAL CARE IF:  You continue to feel dizzy or light-headed after standing.  You feel groggy or confused.  You feel cold, clammy, or sick to your stomach (nauseous).  You have blurred vision.  You feel short of breath. SEEK IMMEDIATE MEDICAL CARE IF:   You faint after standing.  You have chest pain.  You have difficulty breathing.   You lose feeling or movement in your arms or legs.   You have slurred speech or difficulty talking, or you are unable to talk.  MAKE SURE YOU:   Understand these instructions.  Will watch your condition.  Will get help right away if you are not doing well or get worse.   This information is not intended to replace advice given to you by your health care provider. Make sure you discuss any questions you have with your health care provider.   Document Released: 04/26/2002 Document Revised: 05/11/2013 Document Reviewed: 02/26/2013 Elsevier Interactive Patient Education [employer].  IF you received an x-ray today, you will receive an invoice from [facility]. Please contact [facility] at [phone] with questions or concerns regarding your invoice.   IF you received labwork today, you will receive an invoice from [employer]. Please contact [facility] at [phone] with questions or concerns regarding your  invoice.   Our billing staff will not be able to assist you with questions regarding bills from these companies.  You will be contacted with the lab results as soon as they are available. The fastest way to get your results is to activate your My Chart account. Instructions are located on the last page of this paperwork. If you have not heard from [place] regarding the results in 2 weeks, please contact this office.

## 2015-12-23 NOTE — Progress Notes (Signed)
MRN: [id number] DOB: [birth year]  Subjective:   Crystal Bauer is a 60 y.o. female presenting for chief complaint of Dizziness (When standing ) and Shortness of Breath (Pt states exhaust fumes come out of vent at work)  Reports 2 week history of dizziness, especially when standing. Patient is concerned that she started noticing that there are exhaust fumes at work that have caused her shob, fatigue. She has since moved to the next office in the past week but her symptoms have persisted. Has also had heart racing, palpitations. Admits a history of atrial fibrillation years ago associated with caffeine use. Reports that her last f/u with Dr. Cruzita Lederer was in 07/10/2015, was advised to continue cabergoline for prolactinoma, no f/u was scheduled. Denies fever, headache, chest pain, n/v, tinnitus. Denies smoking cigarettes.   Crystal Bauer has a current medication list which includes the following prescription(s): cabergoline, vitamin d3, fish oil-omega-3 fatty acids, and multiple vitamins-minerals. Also is allergic to erythromycin and quinolones.  Crystal Bauer  has a past medical history of Atrial fibrillation ([place]) (2000); Cataract; Hearing loss (2012); and History of chicken pox. Also  has a past surgical history that includes Tonsillectomy and adenoidectomy (1962) and Cataract extraction.  Objective:   Vitals: BP 116/64 (BP Location: Right Arm, Patient Position: Sitting, Cuff Size: Normal)   Pulse 83   Temp 98.7 F (37.1 C) (Oral)   Resp 16   Ht 5' 8.5" (1.74 m)   Wt 201 lb 6.4 oz (91.4 kg)   SpO2 96%   BMI 30.18 kg/m   Orthostatic VS for the past 24 hrs:  BP- Lying Pulse- Lying BP- Sitting Pulse- Sitting BP- Standing at 0 minutes Pulse- Standing at 0 minutes  12/23/15 1424 108/60 84 100/64 93 (!) 88/52 111   Physical Exam  Constitutional: She is oriented to person, place, and time. She appears well-developed and well-nourished.  HENT:  Mouth/Throat: Oropharynx is clear and moist.    Eyes: EOM are normal. Pupils are equal, round, and reactive to light.  Neck: Normal range of motion. Neck supple.  Cardiovascular: Normal rate, regular rhythm and intact distal pulses.  Exam reveals no gallop and no friction rub.   No murmur heard. Pulmonary/Chest: No respiratory distress. She has no wheezes. She has no rales.  Neurological: She is alert and oriented to person, place, and time. No cranial nerve deficit.  Skin: Skin is warm and dry.   ECG interpretation - sinus rhythm.  Results for orders placed or performed in visit on 12/23/15 (from the past 24 hour(s))  POCT CBC     Status: Abnormal   Collection Time: 12/23/15  3:25 PM  Result Value Ref Range   WBC 9.1 4.6 - 10.2 K/uL   Lymph, poc 2.7 0.6 - 3.4   POC LYMPH PERCENT 30.0 10 - 50 %L   MID (cbc) 0.7 0 - 0.9   POC MID % 7.9 0 - 12 %M   POC Granulocyte 5.7 2 - 6.9   Granulocyte percent 62.1 37 - 80 %G   RBC 4.41 4.04 - 5.48 M/uL   Hemoglobin 14.4 12.2 - 16.2 g/dL   HCT, POC 40.6 37.7 - 47.9 %   MCV 92.1 80 - 97 fL   MCH, POC 32.7 (A) 27 - 31.2 pg   MCHC 35.5 (A) 31.8 - 35.4 g/dL   RDW, POC 13.9 %   Platelet Count, POC 283 142 - 424 K/uL   MPV 7.6 0 - 99.8 fL   Assessment and Plan :  1. Lightheadedness 2. Orthostatic hypotension 3. History of atrial fibrillation 4. Other fatigue - I suspect a combination of cabergoline and the exhaust from her work may be contributors to her orthostatic hypotension. She is to f/u with her doctor regarding her dose of cabergoline, declined albuterol inhaler and meclizine or dramamine. Counseled on conservative management. Labs pending. Follow up as needed.  [name], PA-C [facility] [phone] 12/23/2015 2:59 PM

## 2015-12-24 ENCOUNTER — Other Ambulatory Visit: Payer: Self-pay | Admitting: Internal Medicine

## 2015-12-24 LAB — TSH: TSH: 0.78 m[IU]/L

## 2015-12-26 ENCOUNTER — Telehealth: Payer: Self-pay

## 2015-12-27 ENCOUNTER — Encounter: Payer: Self-pay | Admitting: Internal Medicine

## 2015-12-27 ENCOUNTER — Ambulatory Visit (INDEPENDENT_AMBULATORY_CARE_PROVIDER_SITE_OTHER): Payer: BLUE CROSS/BLUE SHIELD | Admitting: Internal Medicine

## 2015-12-27 VITALS — BP 110/68 | HR 77 | Wt 200.0 lb

## 2015-12-27 DIAGNOSIS — D352 Benign neoplasm of pituitary gland: Secondary | ICD-10-CM | POA: Diagnosis not present

## 2015-12-27 DIAGNOSIS — R42 Dizziness and giddiness: Secondary | ICD-10-CM | POA: Diagnosis not present

## 2015-12-27 LAB — VITAMIN D 25 HYDROXY (VIT D DEFICIENCY, FRACTURES): VITD: 27.08 ng/mL — ABNORMAL LOW (ref 30.00–100.00)

## 2015-12-27 LAB — VITAMIN B12: Vitamin B-12: 254 pg/mL (ref 211–911)

## 2015-12-27 MED ORDER — CABERGOLINE 0.5 MG PO TABS: 0.2500 mg | ORAL_TABLET | ORAL | 1 refills | Status: DC

## 2015-12-27 NOTE — Patient Instructions (Signed)
Please stop at the lab.  Please come back for another Prolactin level in 2 months.  Please decrease Cabergoline 0.25 mg to 1x a week.  Please return in 1 year.

## 2015-12-27 NOTE — Telephone Encounter (Signed)
Please let patient know that her electrolytes, blood sugar, liver enzymes and kidney function were normal. Her thyroid function was also normal.

## 2015-12-27 NOTE — Telephone Encounter (Signed)
Phone message placed

## 2015-12-27 NOTE — Progress Notes (Signed)
Patient ID: Crystal Bauer, female   DOB: 12-30-1955, 60 y.o.   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 Last visit 1 year ago.  She had lightheadedness/dizziness for last 2 weeks >> went to UC >> found to be orthostatic.  MNG: Reviewed hx: Patient complained of an enlarging left lower neck mass which was palpated at the last visit with her PCP.  Thyroid U/S on 02/04/2013 showed bilateral thyroid nodules, small, less than 5 mm in the right lobe, and 3 larger nodules in the left lobe. The largest lesion is a 1.7 x 1.0 x 1.3 cm cyst situated in the left upper lobe, with post-acoustic shadowing, and colloid particles on the periphery.  I reviewed pt's thyroid tests: Lab Results  Component Value Date   TSH 0.78 12/23/2015   TSH 0.63 03/06/2015   TSH 1.10 12/02/2014   TSH 0.39 03/04/2014   TSH 0.63 02/26/2013   TSH 0.86 10/01/2012   FREET4 0.78 03/06/2015   FREET4 0.87 03/04/2014   FREET4 0.85 02/26/2013    Pt denies hoarseness, no dysphagia. No odynophagia, no SOB with lying down.  Pt c/o: - + weight gain - + milky breast discharge L breast - no palpitations  - no heat intolerance/cold intolrance - no tremors - no anxiety/depression - no fatigue - no hyperdefecation/constipation   Micro-prolactinoma: - dx in 1992 She was initially on Cabergoline >> stopped in 2000 as her microprolactinoma shrank. PRL in ~2004 minimally elevated. (off Cabergoline)  We tried to stay off Cabergoline, but she started to have L breast milky discharge 1 mo ago. + breast pain (lump). We checked PRL at last visit >> increased to 122 >> started back on Cabergoline 0.25 mg bid >> levels improved  Reviewed PRL levels: Lab Results  Component Value Date   PROLACTIN 13.4 07/07/2015   PROLACTIN 57.1 (H) 03/06/2015   PROLACTIN 122.2 12/02/2014   PROLACTIN 99.8 03/04/2014   PROLACTIN 76.0 02/26/2013   Previous  pituitary labs were normal in 02/2013: Somatomedin (IGF-I)     31 - 179 ng/mL 119  C206 ACTH     10 - 46 pg/mL 21  Cortisol, Plasma      12.6  FSH      83.9  LH      35.53   MRI (03/09/2014): microadenoma, 6 mm.  She is now postmenopausal and does not complain of galactorrhea, headaches, visual problems, therefore we decided to follow the PRL levels and not intervene.  ROS: Constitutional: no weight gain/loss, + fatigue, no subjective hyperthermia/hypothermia Eyes: no blurry vision, no xerophthalmia ENT: no sore throat,no nodules palpated in throat, no dysphagia/odynophagia, no hoarseness Cardiovascular: no CP/SOB/palpitations/leg swelling Respiratory: no cough/SOB Gastrointestinal: no N/V/D/C/heartburn Musculoskeletal: no muscle/joint aches Skin: no rashes Neurological: no tremors/numbness/tingling/+ dizziness  I reviewed pt's medications, allergies, PMH, social hx, family hx, and changes were documented in the history of present illness. Otherwise, unchanged from my initial visit note.Stopped [place].  PE: BP 110/68 (BP Location: Left Arm, Patient Position: Sitting)   Pulse 77   Wt 200 lb (90.7 kg)   SpO2 95%   BMI 29.97 kg/m  Wt Readings from Last 3 Encounters:  12/27/15 200 lb (90.7 kg)  12/23/15 201 lb 6.4 oz (91.4 kg)  01/15/15 201 lb 3.2 oz (91.3 kg)   Constitutional: overweight, in NAD Eyes: PERRLA, EOMI, no exophthalmos ENT: moist mucous membranes, no thyromegaly or nodules palpated, no cervical lymphadenopathy Cardiovascular:  RRR, + 1/6 SEM Respiratory: CTA B Gastrointestinal: abdomen soft, NT, ND, BS+ Musculoskeletal: no deformities, strength intact in all 4;  Skin: moist, warm, no rashes Neurological: mild tremor with outstretched hands, DTR normal in all 4  ASSESSMENT: 1. MNG - thyroid U/S 02/04/2013:  Right thyroid lobe - 4.2 x 1.5 x 1.1 cm. Only small right thyroid nodules are present of no more than 5 mm in diameter.   Left thyroid lobe - 4.9  x 2.1 x 1.9 cm. Multiple left thyroid nodules are present. The largest is complex but primarily cystic in the upper pole of the left lobe measuring 1.7 x 1.0 x 1.3 cm. Peripheral calcifications are noted within this cystic lesion. (these do appear to be not calcifications but inspissated colloid due to presence of comet tail artifact. Nodules in the lower pole of the left lobe measure 1.2 x 0.7 x 1.1 cm, and 1.0 x 0.8 x 1.8 cm. One of them is a cyst.  Isthmus  -  Thickness: 3 mm in thickness. No nodules visualized.   Lymphadenopathy  - None visualized.  2. History of microprolactinoma - dx 1992 - previously on cabergoline until 2000, when she took herself off it due to palpitations - prolactin check in ~2004 was mildly elevated - has 3 children - she is now postmenopausal - no osteoporosis - MRI (03/09/2014): 1. Findings suspicious for a 6 mm focus of hypo enhancing soft  tissue arising somewhat exophyticly from the caudal aspect of the  gland, suspicious for a microadenoma in this setting. 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  2. Otherwise normal pituitary imaging.  3. Otherwise normal for age MRI appearance of the brain.    PLAN: 1. MNG  - I reviewed the report of her last thyroid ultrasound along with the patient. She has a left-sided cyst with coagulated colloid. No neck compression sxs. - will repeat Thyroid U/S next year - reviewed TSH from this mo >> normal - I'll see her back in a year  2. Microprolactinoma - on Cabergoline 0.25 mg 2x a week started at last visit >>  will check a prolactin level, but I suspect we can decrease the dose to 1x a week and check a new PRL level 3 mo after starting tx - resolved breast tenderness and galactorrhea in L breast  - MRI of the pituitary shows a small microprolactinoma and her pituitary hh are normal >> no need to repeat the MRI or HH evaluation in the future unless the PRL not  improving on tx  3. Dizziness - per her request, will check B12 and D vitamins - she is not taking a MVI  Office Visit on 12/27/2015  Component Date Value Ref Range Status  . Prolactin 12/28/2015 24.1  ng/mL Final   Comment: Reference Range Adult Female  Non-Pregnant 3.0-30.0 ng/mL  Pregnant 10.0-209.0 ng/mL  Postmenopausal 2.0-20.0 ng/mL     . Vitamin B-12 12/27/2015 254  211 - 911 pg/mL Final  . VITD 12/27/2015 27.08* 30.00 - 100.00 ng/mL Final   Message sent: Dear Ms Lakner, Your prolactin level is normal, but we can definitely go ahead and decrease the dose of cabergoline and recheck the level in 2 months as we discussed.  Your vitamin D is slightly low >>I would suggest to start at 2000 units supplement daily. This is over-the-counter. Your vitamin B12 is quite low, also, so I would recommend to start 1000 mcg of B12 daily. This  is also over-the-counter.  Please follow with your primary care doctor to recheck your vitamin D levels in about 2 months to see if the above doses are enough. We can also check them here, please let me know if you prefer this. Sincerely, Philemon Kingdom MD  Philemon Kingdom, MD PhD [facility]

## 2015-12-28 ENCOUNTER — Encounter: Payer: Self-pay | Admitting: Internal Medicine

## 2015-12-28 ENCOUNTER — Other Ambulatory Visit: Payer: Self-pay | Admitting: Internal Medicine

## 2015-12-28 ENCOUNTER — Encounter: Payer: Self-pay | Admitting: Urgent Care

## 2015-12-28 DIAGNOSIS — E559 Vitamin D deficiency, unspecified: Secondary | ICD-10-CM

## 2015-12-28 DIAGNOSIS — E538 Deficiency of other specified B group vitamins: Secondary | ICD-10-CM

## 2015-12-28 LAB — PROLACTIN: PROLACTIN: 24.1 ng/mL

## 2015-12-28 NOTE — Telephone Encounter (Signed)
Pt was informed.

## 2016-02-26 ENCOUNTER — Other Ambulatory Visit (INDEPENDENT_AMBULATORY_CARE_PROVIDER_SITE_OTHER): Payer: BLUE CROSS/BLUE SHIELD

## 2016-02-26 DIAGNOSIS — E559 Vitamin D deficiency, unspecified: Secondary | ICD-10-CM

## 2016-02-26 DIAGNOSIS — E538 Deficiency of other specified B group vitamins: Secondary | ICD-10-CM

## 2016-02-27 LAB — VITAMIN B12: Vitamin B-12: 573 pg/mL (ref 211–911)

## 2016-02-27 LAB — VITAMIN D 25 HYDROXY (VIT D DEFICIENCY, FRACTURES): VITD: 39.05 ng/mL (ref 30.00–100.00)

## 2016-11-13 ENCOUNTER — Other Ambulatory Visit: Payer: Self-pay

## 2016-11-13 MED ORDER — CABERGOLINE 0.5 MG PO TABS: 0.2500 mg | ORAL_TABLET | ORAL | 1 refills | Status: DC

## 2016-12-26 ENCOUNTER — Ambulatory Visit (INDEPENDENT_AMBULATORY_CARE_PROVIDER_SITE_OTHER): Payer: 59 | Admitting: Internal Medicine

## 2016-12-26 ENCOUNTER — Encounter: Payer: Self-pay | Admitting: Internal Medicine

## 2016-12-26 VITALS — BP 108/62 | HR 78 | Wt 183.0 lb

## 2016-12-26 DIAGNOSIS — D352 Benign neoplasm of pituitary gland: Secondary | ICD-10-CM | POA: Diagnosis not present

## 2016-12-26 DIAGNOSIS — E538 Deficiency of other specified B group vitamins: Secondary | ICD-10-CM

## 2016-12-26 DIAGNOSIS — E042 Nontoxic multinodular goiter: Secondary | ICD-10-CM

## 2016-12-26 DIAGNOSIS — E559 Vitamin D deficiency, unspecified: Secondary | ICD-10-CM | POA: Diagnosis not present

## 2016-12-26 DIAGNOSIS — R7989 Other specified abnormal findings of blood chemistry: Secondary | ICD-10-CM | POA: Diagnosis not present

## 2016-12-26 LAB — TSH: TSH: 0.92 u[IU]/mL (ref 0.35–4.50)

## 2016-12-26 LAB — VITAMIN D 25 HYDROXY (VIT D DEFICIENCY, FRACTURES): VITD: 36.12 ng/mL (ref 30.00–100.00)

## 2016-12-26 LAB — VITAMIN B12

## 2016-12-26 NOTE — Patient Instructions (Signed)
Please stop at the lab.  Please continue Cabergoline 0.25 mg 1x a week.  Continue vitamin D 2000 IU daily and vitamin B12 1000 mcg daily.  Please return in 1 year.

## 2016-12-26 NOTE — Progress Notes (Signed)
Patient ID: Crystal Bauer, female   DOB: Jan 01, 1956, 61 y.o.   MRN: [id number]   HPI  Crystal Bauer is a 61 y.o.-year-old female, returning for f/u for MNG, microprolactinoma, vitamin B12 and D insufficiencies. Last visit 1 year ago.   MNG: Reviewed hx: Patient complained of an enlarging left lower neck mass which was palpated at a visit with her PCP.  Thyroid U/S on 02/04/2013 showed bilateral thyroid nodules, small, less than 5 mm in the right lobe, and 3 larger nodules in the left lobe. The largest lesion is a 1.7 x 1.0 x 1.3 cm cyst situated in the left upper lobe, with post-acoustic shadowing, and colloid particles on the periphery.  I reviewed pt's thyroid tests - all normal: Lab Results  Component Value Date   TSH 0.78 12/23/2015   TSH 0.63 03/06/2015   TSH 1.10 12/02/2014   TSH 0.39 03/04/2014   TSH 0.63 02/26/2013   TSH 0.86 10/01/2012   FREET4 0.78 03/06/2015   FREET4 0.87 03/04/2014   FREET4 0.85 02/26/2013    Pt denies: - feeling nodules in neck - hoarseness - dysphagia - choking - SOB with lying down  Micro-prolactinoma: - dx in 1992 She was initially on Cabergoline >> stopped in 2000 as her microprolactinoma shrank. PRL in ~2004 minimally elevated. (off Cabergoline)  We tried to stay off Cabergoline in 2016, but she started to have L breast milky discharge + breast pain (lump). We checked PRL  >> increased to 122 >> started back on Cabergoline 0.25 mg bid >> levels improved >> decreased to once weekly in 06/2015.   Pt denies: - galactorrhea - weight gain >> lost 20 lbs (intentional: diet and exercise) - HA  Reviewed PRL levels: Lab Results  Component Value Date   PROLACTIN 24.1 12/27/2015   PROLACTIN 13.4 07/07/2015   PROLACTIN 57.1 (H) 03/06/2015   PROLACTIN 122.2 12/02/2014   PROLACTIN 99.8 03/04/2014   PROLACTIN 76.0 02/26/2013   Previous pituitary labs were normal in 02/2013: Somatomedin (IGF-I)     31 - 179 ng/mL 119  C206 ACTH  10 - 46 pg/mL 21  Cortisol, Plasma      12.6  FSH      83.9  LH      35.53   MRI (03/09/2014): microadenoma, 6 mm.  She is now postmenopausal and does not complain of galactorrhea, headaches, visual problems, therefore we decided to follow the PRL levels and not intervene.  She was found to have low vitamin D and B12: Lab Results  Component Value Date   VD25OH 39.05 02/26/2016   VD25OH 27.08 (L) 12/27/2015   On vitamin D 2000 units daily.  Lab Results  Component Value Date   NOBSJGGE36 629 02/26/2016   VITAMINB12 254 12/27/2015   On B12 1000 mcg daily.  ROS: Constitutional: + weight loss, no fatigue, no subjective hyperthermia, no subjective hypothermia Eyes: no blurry vision, no xerophthalmia ENT: no sore throat, no nodules palpated in throat, no dysphagia, no odynophagia, no hoarseness Cardiovascular: no CP/no SOB/no palpitations/no leg swelling Respiratory: no cough/no SOB/no wheezing Gastrointestinal: no N/no V/no D/no C/no acid reflux Musculoskeletal: no muscle aches/no joint aches Skin: no rashes, no hair loss Neurological: no tremors/no numbness/no tingling/no dizziness  I reviewed pt's medications, allergies, PMH, social hx, family hx, and changes were documented in the history of present illness. Otherwise, unchanged from my initial visit note.   PE: BP 108/62 (BP Location: Left Arm, Patient Position: Sitting)   Pulse 78  Wt 183 lb (83 kg)   SpO2 96%   BMI 27.42 kg/m  Wt Readings from Last 3 Encounters:  12/26/16 183 lb (83 kg)  12/27/15 200 lb (90.7 kg)  12/23/15 201 lb 6.4 oz (91.4 kg)   Constitutional: slightly overweight, in NAD Eyes: PERRLA, EOMI, no exophthalmos ENT: moist mucous membranes, no thyromegaly, no cervical lymphadenopathy Cardiovascular: RRR, No RG, +1/6 SEM Respiratory: CTA B Gastrointestinal: abdomen soft, NT, ND, BS+ Musculoskeletal: no deformities, strength intact in all 4 Skin: moist, warm, no rashes Neurological: no tremor  with outstretched hands, DTR normal in all 4  ASSESSMENT: 1. MNG - thyroid U/S 02/04/2013:  Right thyroid lobe - 4.2 x 1.5 x 1.1 cm. Only small right thyroid nodules are present of no more than 5 mm in diameter.   Left thyroid lobe - 4.9 x 2.1 x 1.9 cm. Multiple left thyroid nodules are present. The largest is complex but primarily cystic in the upper pole of the left lobe measuring 1.7 x 1.0 x 1.3 cm. Peripheral calcifications are noted within this cystic lesion. (these do appear to be not calcifications but inspissated colloid due to presence of comet tail artifact. Nodules in the lower pole of the left lobe measure 1.2 x 0.7 x 1.1 cm, and 1.0 x 0.8 x 1.8 cm. One of them is a cyst.  Isthmus  -  Thickness: 3 mm in thickness. No nodules visualized.   Lymphadenopathy  - None visualized.  2. History of microprolactinoma - dx 1992 - previously on cabergoline until 2000, when she took herself off it due to palpitations - prolactin check in ~2004 was mildly elevated - has 3 children - she is now postmenopausal - no osteoporosis - MRI (03/09/2014): 1. Findings suspicious for a 6 mm focus of hypo enhancing soft  tissue arising somewhat exophyticly from the caudal aspect of the  gland, suspicious for a microadenoma in this setting. 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  2. Otherwise normal pituitary imaging.  3. Otherwise normal for age MRI appearance of the brain.   3. Vit D insuff  4. Low Vit B12    PLAN: 1. MNG  - reviewed the report of latest thyroid U/S: She has a left-sided cyst with coagulated colloid - No neck compression symptoms - will repeat Thyroid U/S if she develops neck compression sxs - reviewed TSH from 1 year ago >> normal - will recheck today - I'll see her back in 1 year  2. Microprolactinoma - Patient is a 6 mm micro-prolactinoma and her pituitary hormones were normal. There is no need to repeat the MRI  or hormone evaluation in the future unless the prolactin is not improving on treatment - She continues on Cabergoline 0.25 mg 1x a week. No SEs - Will check a prolactin level today - She denies breast tenderness and galactorrhea in L breast. Also denies headaches or weight gain.  3. Vit D insuff - we started 2000 IU daily - started after last visit - Recheck level today  4. Low Vit B12  - on B12 1000 mcg daily - started after last visit - She is feeling much better after she started the supplement - Recheck level today  Component     Latest Ref Rng & Units 12/26/2016  Prolactin     ng/mL 38.5 (H)  Vitamin B12     211 - 911 pg/mL >1500 (H)  VITD     30.00 - 100.00 ng/mL  36.12  TSH     0.35 - 4.50 uIU/mL 0.92   Vitamin D normal, B12 not low >> will continue current supplements. PRL increased >> will increase the dose to 2x a week and repeat PRL level in 2 mo.  Philemon Kingdom, MD PhD [facility]

## 2016-12-27 LAB — PROLACTIN: PROLACTIN: 38.5 ng/mL — AB

## 2016-12-27 MED ORDER — CABERGOLINE 0.5 MG PO TABS: 0.2500 mg | ORAL_TABLET | ORAL | 3 refills | Status: DC

## 2017-02-26 ENCOUNTER — Other Ambulatory Visit: Payer: Self-pay | Admitting: Internal Medicine

## 2017-06-22 ENCOUNTER — Other Ambulatory Visit: Payer: Self-pay | Admitting: Internal Medicine

## 2017-08-25 ENCOUNTER — Encounter: Payer: Self-pay | Admitting: Internal Medicine

## 2017-08-26 ENCOUNTER — Other Ambulatory Visit: Payer: Self-pay

## 2017-08-26 MED ORDER — CABERGOLINE 0.5 MG PO TABS: 0.2500 mg | ORAL_TABLET | ORAL | 3 refills | Status: DC

## 2017-11-17 ENCOUNTER — Encounter: Payer: Self-pay | Admitting: Family Medicine

## 2017-11-17 ENCOUNTER — Ambulatory Visit: Payer: PRIVATE HEALTH INSURANCE | Admitting: Family Medicine

## 2017-11-17 ENCOUNTER — Other Ambulatory Visit: Payer: Self-pay

## 2017-11-17 VITALS — BP 90/62 | HR 85 | Temp 98.8°F | Resp 17 | Ht 68.0 in | Wt 184.0 lb

## 2017-11-17 DIAGNOSIS — Z800 Family history of malignant neoplasm of digestive organs: Secondary | ICD-10-CM

## 2017-11-17 DIAGNOSIS — Z1159 Encounter for screening for other viral diseases: Secondary | ICD-10-CM | POA: Diagnosis not present

## 2017-11-17 DIAGNOSIS — N6012 Diffuse cystic mastopathy of left breast: Secondary | ICD-10-CM | POA: Diagnosis not present

## 2017-11-17 DIAGNOSIS — Z803 Family history of malignant neoplasm of breast: Secondary | ICD-10-CM

## 2017-11-17 DIAGNOSIS — Z842 Family history of other diseases of the genitourinary system: Secondary | ICD-10-CM

## 2017-11-17 DIAGNOSIS — Z114 Encounter for screening for human immunodeficiency virus [HIV]: Secondary | ICD-10-CM | POA: Diagnosis not present

## 2017-11-17 DIAGNOSIS — Z13220 Encounter for screening for lipoid disorders: Secondary | ICD-10-CM | POA: Diagnosis not present

## 2017-11-17 DIAGNOSIS — Z131 Encounter for screening for diabetes mellitus: Secondary | ICD-10-CM | POA: Diagnosis not present

## 2017-11-17 DIAGNOSIS — Z0000 Encounter for general adult medical examination without abnormal findings: Secondary | ICD-10-CM | POA: Diagnosis not present

## 2017-11-17 NOTE — Patient Instructions (Addendum)
IF you received an x-ray today, you will receive an invoice from [facility]. Please contact [facility] at [phone] with questions or concerns regarding your invoice.   IF you received labwork today, you will receive an invoice from [facility]. Please contact [facility] at [phone] with questions or concerns regarding your invoice.   Our billing staff will not be able to assist you with questions regarding bills from these companies.  You will be contacted with the lab results as soon as they are available. The fastest way to get your results is to activate your My Chart account. Instructions are located on the last page of this paperwork. If you have not heard from [place] regarding the results in 2 weeks, please contact this office.     Breast Self-Awareness Breast self-awareness means being familiar with how your breasts look and feel. It involves checking your breasts regularly and reporting any changes to your health care provider. Practicing breast self-awareness is important. A change in your breasts can be a sign of a serious medical problem. Being familiar with how your breasts look and feel allows you to find any problems early, when treatment is more likely to be successful. All women should practice breast self-awareness, including women who have had breast implants. How to do a breast self-exam One way to learn what is normal for your breasts and whether your breasts are changing is to do a breast self-exam. To do a breast self-exam: Look for Changes  1. Remove all the clothing above your waist. 2. Stand in front of a mirror in a room with good lighting. 3. Put your hands on your hips. 4. Push your hands firmly downward. 5. Compare your breasts in the mirror. Look for differences between them (asymmetry), such as: ? Differences in shape. ? Differences in size. ? Puckers, dips, and bumps in one breast and not the other. 6. Look at each breast for changes  in your skin, such as: ? Redness. ? Scaly areas. 7. Look for changes in your nipples, such as: ? Discharge. ? Bleeding. ? Dimpling. ? Redness. ? A change in position. Feel for Changes  Carefully feel your breasts for lumps and changes. It is best to do this while lying on your back on the floor and again while sitting or standing in the shower or tub with soapy water on your skin. Feel each breast in the following way:  Place the arm on the side of the breast you are examining above your head.  Feel your breast with the other hand.  Start in the nipple area and make  inch (2 cm) overlapping circles to feel your breast. Use the pads of your three middle fingers to do this. Apply light pressure, then medium pressure, then firm pressure. The light pressure will allow you to feel the tissue closest to the skin. The medium pressure will allow you to feel the tissue that is a little deeper. The firm pressure will allow you to feel the tissue close to the ribs.  Continue the overlapping circles, moving downward over the breast until you feel your ribs below your breast.  Move one finger-width toward the center of the body. Continue to use the  inch (2 cm) overlapping circles to feel your breast as you move slowly up toward your collarbone.  Continue the up and down exam using all three pressures until you reach your armpit.  Write Down What You Find  Write down what is normal for  each breast and any changes that you find. Keep a written record with breast changes or normal findings for each breast. By writing this information down, you do not need to depend only on memory for size, tenderness, or location. Write down where you are in your menstrual cycle, if you are still menstruating. If you are having trouble noticing differences in your breasts, do not get discouraged. With time you will become more familiar with the variations in your breasts and more comfortable with the exam. How often  should I examine my breasts? Examine your breasts every month. If you are breastfeeding, the best time to examine your breasts is after a feeding or after using a breast pump. If you menstruate, the best time to examine your breasts is 5-7 days after your period is over. During your period, your breasts are lumpier, and it may be more difficult to notice changes. When should I see my health care provider? See your health care provider if you notice:  A change in shape or size of your breasts or nipples.  A change in the skin of your breast or nipples, such as a reddened or scaly area.  Unusual discharge from your nipples.  A lump or thick area that was not there before.  Pain in your breasts.  Anything that concerns you.  This information is not intended to replace advice given to you by your health care provider. Make sure you discuss any questions you have with your health care provider. Document Released: 05/06/2005 Document Revised: 10/12/2015 Document Reviewed: 03/26/2015 Elsevier Interactive Patient Education  [employer].

## 2017-11-17 NOTE — Progress Notes (Signed)
Chief Complaint  Patient presents with  . new patient to establish care    Subjective:  Crystal Bauer is a 62 y.o. female here for a health maintenance visit.  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 Fibrocystic disease of left breast in female 11/17/2017  . Family history of colon cancer in mother 11/17/2017  . Family history of breast cancer in mother 11/17/2017  . Vitamin D insufficiency 12/26/2016  . Low serum vitamin B12 12/26/2016  . 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 Multinodular goiter 02/26/2013    Past Medical History:  Diagnosis Date  . Atrial fibrillation ([place]) 2000   from stress/increased caffeine use  . Cataract   . 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 History of chicken pox     Past Surgical History:  Procedure Laterality Date  . CATARACT EXTRACTION    . TONSILLECTOMY AND ADENOIDECTOMY  1962     Outpatient Medications Prior to Visit  Medication Sig Dispense Refill  . cabergoline (DOSTINEX) 0.5 MG tablet Take 0.5 tablets (0.25 mg total) by mouth 2 (two) times a week. 12 tablet 3  . Cholecalciferol (VITAMIN D3) 5000 UNITS TABS Take 1 tablet by mouth every other day.     . fish oil-omega-3 fatty acids 1000 MG capsule Take 2 g by mouth daily.    . Multiple Vitamins-Minerals (WOMENS MULTIVITAMIN PLUS PO) Take 1 capsule by mouth daily.     No facility-administered medications prior to visit.     Allergies  Allergen Reactions  . Erythromycin Shortness Of Breath  . Quinolones Other (See Comments)    Insomnia      Family History  Problem Relation Age of Onset  . Cancer Mother    breast, colon  . Colon cancer Mother 45  . Stroke Mother 41       10/18/2017  . Diabetes Maternal Grandmother   . Diabetes Brother        Type 2 Diabetes     Social History   Socioeconomic History  . Marital status: Divorced    Spouse name: Not on file  . Number of children: Not on file  . Years of education: 12+  . Highest education level: Not on file  Occupational History  . Occupation: Programmer, multimedia: [employer]   Social Needs  . Financial resource strain: Not on file  . Food insecurity:    Worry: Not on file    Inability: Not on file  . Transportation needs:    Medical: Not on file    Non-medical: Not on file  Tobacco Use  . Smoking status: Former Research scientist (life sciences)  . Smokeless tobacco: Never Used  Substance and Sexual Activity  . Alcohol use: No  . Drug use: No  . Sexual activity: Never  Lifestyle  . Physical activity:    Days per week: Not on file    Minutes per session: Not on file  . Stress: Not on file  Relationships  . Social connections:    Talks on phone: Not on file    Gets together: Not on  file    Attends religious service: Not on file    Active member of club or organization: Not on file    Attends meetings of clubs or organizations: Not on file    Relationship status: Not on file  . Intimate partner violence:    Fear of current or ex partner: Not on file    Emotionally abused: Not on file    Physically abused: Not on file    Forced sexual activity: Not on file  Other Topics Concern  . Not on file  Social History Narrative   Regular exercise-no   Caffeine Use-yes   Social History   Substance and Sexual Activity  Alcohol Use No   Social History   Tobacco Use  Smoking Status Former Smoker  Smokeless Tobacco Never Used   Social History   Substance and Sexual Activity  Drug Use No    GYN: Sexual Health Menstrual status: regular menses LMP: No LMP recorded. Patient is postmenopausal. Last pap smear: see HM  section History of abnormal pap smears:    Health Maintenance: See under health Maintenance activity for review of completion dates as well.  There is no immunization history on file for this patient.    Depression Screen-PHQ2/9 Depression screen [facility] 2/9 11/17/2017 12/23/2015 01/15/2015 10/01/2012  Decreased Interest 0 0 0 0  Down, Depressed, Hopeless 0 0 0 0  PHQ - 2 Score 0 0 0 0       Depression Severity and Treatment Recommendations:  0-4= None  5-9= Mild / Treatment: Support, educate to call if worse; return in one month  10-14= Moderate / Treatment: Support, watchful waiting; Antidepressant or Psycotherapy  15-19= Moderately severe / Treatment: Antidepressant OR Psychotherapy  >= 20 = Major depression, severe / Antidepressant AND Psychotherapy    Review of Systems   Review of Systems  Constitutional: Negative for chills, fever and weight loss.  HENT: Negative for ear pain, hearing loss and tinnitus.   Respiratory: Negative for cough, hemoptysis, sputum production, shortness of breath and wheezing.   Cardiovascular: Negative for chest pain, palpitations, orthopnea and leg swelling.  Gastrointestinal: Negative for abdominal pain, nausea and vomiting.  Genitourinary: Negative for dysuria and urgency.  Musculoskeletal: Negative for myalgias and neck pain.  Neurological: Negative for dizziness and headaches.  Psychiatric/Behavioral: Negative for depression. The patient is not nervous/anxious.     See HPI for ROS as well.    Objective:   Vitals:   11/17/17 1129  BP: 90/62  Pulse: 85  Resp: 17  Temp: 98.8 F (37.1 C)  TempSrc: Oral  SpO2: 97%  Weight: 184 lb (83.5 kg)  Height: 5\' 8"  (1.727 m)    Body mass index is 27.98 kg/m.  Physical Exam  Constitutional: She is oriented to person, place, and time. She appears well-developed and well-nourished.  HENT:  Head: Normocephalic and atraumatic.  Right Ear: External ear normal.  Left Ear: External ear normal.   Eyes: Conjunctivae and EOM are normal. Right eye exhibits no discharge.  Neck: Normal range of motion. Neck supple.  Cardiovascular: Normal rate, regular rhythm and normal heart sounds.  No murmur heard. Pulmonary/Chest: Effort normal and breath sounds normal. No stridor. No respiratory distress. She has no wheezes.  Abdominal: Soft. Bowel sounds are normal. She exhibits no distension and no mass. There is no tenderness. There is no guarding.  Musculoskeletal: Normal range of motion. She exhibits no edema.  Neurological: She is alert and oriented to person, place, and time.  Skin: Skin  is warm. Capillary refill takes less than 2 seconds.  Psychiatric: She has a normal mood and affect. Her behavior is normal. Judgment and thought content normal.       Assessment/Plan:   Patient was seen for a health maintenance exam.  Counseled the patient on health maintenance issues. Reviewed her health mainteance schedule and ordered appropriate tests (see orders.) Counseled on regular exercise and weight management. Recommend regular eye exams and dental cleaning.   The following issues were addressed today for health maintenance:   Crystal Bauer was seen today for new patient to establish care.  Diagnoses and all orders for this visit:  Encounter for health maintenance examination in adult-  Women's Health Maintenance Plan Advised monthly breast exam which patient is already doing and discussed that annual mammogram will be diagn Advised dental exam every six months Discussed stress management Discussed pap smear screening guidelines   Screening, lipid -     Lipid panel -     Comprehensive metabolic panel -     TSH  Encounter for hepatitis C screening test for low risk patient -     HCV Ab w/Rflx to Verification  Encounter for screening for HIV -     HIV antibody  Screening for diabetes mellitus -     Hemoglobin A1c  Family history of breast cyst Family history of breast cancer in  mother Family history of colon cancer in mother Fibrocystic disease of left breast in female -     MM Digital Diagnostic Bilat; Future  Other orders -     Interpretation:    Return in about 1 year (around 11/18/2018) for physical.    Body mass index is 27.98 kg/m.:  Discussed the patient's BMI with patient. The BMI body mass index is 27.98 kg/m.     Future Appointments  Date Time Provider [place]  01/05/2018 10:15 AM Philemon Kingdom, MD [facility] None    Patient Instructions       IF you received an x-ray today, you will receive an invoice from [facility]. Please contact [facility] at [phone] with questions or concerns regarding your invoice.   IF you received labwork today, you will receive an invoice from [place]. Please contact [facility] at [phone] with questions or concerns regarding your invoice.   Our billing staff will not be able to assist you with questions regarding bills from these companies.  You will be contacted with the lab results as soon as they are available. The fastest way to get your results is to activate your My Chart account. Instructions are located on the last page of this paperwork. If you have not heard from [place] regarding the results in 2 weeks, please contact this office.     Breast Self-Awareness Breast self-awareness means being familiar with how your breasts look and feel. It involves checking your breasts regularly and reporting any changes to your health care provider. Practicing breast self-awareness is important. A change in your breasts can be a sign of a serious medical problem. Being familiar with how your breasts look and feel allows you to find any problems early, when treatment is more likely to be successful. All women should practice breast self-awareness, including women who have had breast implants. How to do a breast self-exam One way to learn what is normal for your breasts and whether  your breasts are changing is to do a breast self-exam. To do a breast self-exam: Look for Changes  1. Remove all the clothing above your  waist. 2. Stand in front of a mirror in a room with good lighting. 3. Put your hands on your hips. 4. Push your hands firmly downward. 5. Compare your breasts in the mirror. Look for differences between them (asymmetry), such as: ? Differences in shape. ? Differences in size. ? Puckers, dips, and bumps in one breast and not the other. 6. Look at each breast for changes in your skin, such as: ? Redness. ? Scaly areas. 7. Look for changes in your nipples, such as: ? Discharge. ? Bleeding. ? Dimpling. ? Redness. ? A change in position. Feel for Changes  Carefully feel your breasts for lumps and changes. It is best to do this while lying on your back on the floor and again while sitting or standing in the shower or tub with soapy water on your skin. Feel each breast in the following way:  Place the arm on the side of the breast you are examining above your head.  Feel your breast with the other hand.  Start in the nipple area and make  inch (2 cm) overlapping circles to feel your breast. Use the pads of your three middle fingers to do this. Apply light pressure, then medium pressure, then firm pressure. The light pressure will allow you to feel the tissue closest to the skin. The medium pressure will allow you to feel the tissue that is a little deeper. The firm pressure will allow you to feel the tissue close to the ribs.  Continue the overlapping circles, moving downward over the breast until you feel your ribs below your breast.  Move one finger-width toward the center of the body. Continue to use the  inch (2 cm) overlapping circles to feel your breast as you move slowly up toward your collarbone.  Continue the up and down exam using all three pressures until you reach your armpit.  Write Down What You Find  Write down what is normal for each  breast and any changes that you find. Keep a written record with breast changes or normal findings for each breast. By writing this information down, you do not need to depend only on memory for size, tenderness, or location. Write down where you are in your menstrual cycle, if you are still menstruating. If you are having trouble noticing differences in your breasts, do not get discouraged. With time you will become more familiar with the variations in your breasts and more comfortable with the exam. How often should I examine my breasts? Examine your breasts every month. If you are breastfeeding, the best time to examine your breasts is after a feeding or after using a breast pump. If you menstruate, the best time to examine your breasts is 5-7 days after your period is over. During your period, your breasts are lumpier, and it may be more difficult to notice changes. When should I see my health care provider? See your health care provider if you notice:  A change in shape or size of your breasts or nipples.  A change in the skin of your breast or nipples, such as a reddened or scaly area.  Unusual discharge from your nipples.  A lump or thick area that was not there before.  Pain in your breasts.  Anything that concerns you.  This information is not intended to replace advice given to you by your health care provider. Make sure you discuss any questions you have with your health care provider. Document Released: 05/06/2005 Document Revised: 10/12/2015 Document  Reviewed: 03/26/2015 Elsevier Interactive Patient Education  [employer].

## 2017-11-18 LAB — HCV INTERPRETATION

## 2017-11-18 LAB — COMPREHENSIVE METABOLIC PANEL
ALT: 29 IU/L (ref 0–32)
AST: 22 IU/L (ref 0–40)
Albumin/Globulin Ratio: 1.9 (ref 1.2–2.2)
Albumin: 4.4 g/dL (ref 3.6–4.8)
Alkaline Phosphatase: 60 IU/L (ref 39–117)
BUN/Creatinine Ratio: 17 (ref 12–28)
BUN: 14 mg/dL (ref 8–27)
Bilirubin Total: 0.3 mg/dL (ref 0.0–1.2)
CALCIUM: 9.3 mg/dL (ref 8.7–10.3)
CO2: 26 mmol/L (ref 20–29)
CREATININE: 0.82 mg/dL (ref 0.57–1.00)
Chloride: 105 mmol/L (ref 96–106)
GFR calc Af Amer: 89 mL/min/{1.73_m2} (ref >59)
GFR, EST NON AFRICAN AMERICAN: 77 mL/min/{1.73_m2} (ref >59)
GLOBULIN, TOTAL: 2.3 g/dL (ref 1.5–4.5)
Glucose: 91 mg/dL (ref 65–99)
Potassium: 5.2 mmol/L (ref 3.5–5.2)
SODIUM: 144 mmol/L (ref 134–144)
TOTAL PROTEIN: 6.7 g/dL (ref 6.0–8.5)

## 2017-11-18 LAB — LIPID PANEL
CHOL/HDL RATIO: 3.4 ratio (ref 0.0–4.4)
Cholesterol, Total: 199 mg/dL (ref 100–199)
HDL: 59 mg/dL (ref >39)
LDL CALC: 118 mg/dL — AB (ref 0–99)
TRIGLYCERIDES: 111 mg/dL (ref 0–149)
VLDL Cholesterol Cal: 22 mg/dL (ref 5–40)

## 2017-11-18 LAB — HEMOGLOBIN A1C
Est. average glucose Bld gHb Est-mCnc: 111 mg/dL
HEMOGLOBIN A1C: 5.5 % (ref 4.8–5.6)

## 2017-11-18 LAB — HCV AB W/RFLX TO VERIFICATION

## 2017-11-18 LAB — TSH: TSH: 1.53 u[IU]/mL (ref 0.450–4.500)

## 2017-11-18 LAB — HIV ANTIBODY (ROUTINE TESTING W REFLEX): HIV SCREEN 4TH GENERATION: NONREACTIVE

## 2017-11-22 ENCOUNTER — Other Ambulatory Visit: Payer: Self-pay | Admitting: Internal Medicine

## 2017-12-03 ENCOUNTER — Ambulatory Visit (INDEPENDENT_AMBULATORY_CARE_PROVIDER_SITE_OTHER): Payer: PRIVATE HEALTH INSURANCE | Admitting: Physician Assistant

## 2017-12-03 ENCOUNTER — Encounter: Payer: Self-pay | Admitting: Physician Assistant

## 2017-12-03 ENCOUNTER — Ambulatory Visit (INDEPENDENT_AMBULATORY_CARE_PROVIDER_SITE_OTHER): Payer: PRIVATE HEALTH INSURANCE

## 2017-12-03 VITALS — BP 93/58 | HR 76 | Temp 98.1°F | Resp 17 | Ht 68.5 in | Wt 187.0 lb

## 2017-12-03 DIAGNOSIS — M25532 Pain in left wrist: Secondary | ICD-10-CM

## 2017-12-03 DIAGNOSIS — W19XXXA Unspecified fall, initial encounter: Secondary | ICD-10-CM | POA: Diagnosis not present

## 2017-12-03 NOTE — Patient Instructions (Addendum)
CLINICAL DATA:  62 year old female with a history of fall and left wrist pain  EXAM: LEFT WRIST - COMPLETE 3+ VIEW  COMPARISON:  None.  FINDINGS: Osteopenia. No acute displaced fracture. No radiopaque foreign body. No focal soft tissue swelling. Carpal bones maintain alignment. Degenerative changes of the first carpometacarpal joint.  IMPRESSION: No acute bony abnormality.  Degenerative changes of the first carpometacarpal joint.   Electronically Signed   By: Corrie Mckusick D.O.   On: 12/03/2017 15:34       IF you received an x-ray today, you will receive an invoice from [facility]. Please contact [facility] Radiology at [phone] with questions or concerns regarding your invoice.   IF you received labwork today, you will receive an invoice from [place]. Please contact [facility] at [phone] with questions or concerns regarding your invoice.   Our billing staff will not be able to assist you with questions regarding bills from these companies.  You will be contacted with the lab results as soon as they are available. The fastest way to get your results is to activate your My Chart account. Instructions are located on the last page of this paperwork. If you have not heard from [place] regarding the results in 2 weeks, please contact this office.

## 2017-12-03 NOTE — Progress Notes (Deleted)
    12/03/2017 3:52 PM   DOB: Apr 20, 1956 / MRN: [id number]  SUBJECTIVE:  Crystal Bauer is a 62 y.o. female presenting for ***. Symptoms present for ***.  The problem is ***. She has tried ***.  She is allergic to erythromycin and quinolones.   She  has a past medical history of Atrial fibrillation ([place]) (2000), Cataract, Hearing loss (2012), and History of chicken pox.    She  reports that she has quit smoking. She has never used smokeless tobacco. She reports that she does not drink alcohol or use drugs. She  reports that she does not engage in sexual activity. The patient  has a past surgical history that includes Tonsillectomy and adenoidectomy (1962) and Cataract extraction.  Her family history includes Cancer in her mother; Colon cancer (age of onset: 54) in her mother; Diabetes in her brother and maternal grandmother; Stroke (age of onset: 55) in her mother.  ROS  The problem list and medications were reviewed and updated by myself where necessary and exist elsewhere in the encounter.   OBJECTIVE:  BP (!) 93/58   Pulse 76   Temp 98.1 F (36.7 C) (Oral)   Resp 17   Ht 5' 8.5" (1.74 m)   Wt 187 lb (84.8 kg)   SpO2 98%   BMI 28.02 kg/m   Wt Readings from Last 3 Encounters:  12/03/17 187 lb (84.8 kg)  11/17/17 184 lb (83.5 kg)  12/26/16 183 lb (83 kg)   Temp Readings from Last 3 Encounters:  12/03/17 98.1 F (36.7 C) (Oral)  11/17/17 98.8 F (37.1 C) (Oral)  12/23/15 98.7 F (37.1 C) (Oral)   BP Readings from Last 3 Encounters:  12/03/17 (!) 93/58  11/17/17 90/62  12/26/16 108/62   Pulse Readings from Last 3 Encounters:  12/03/17 76  11/17/17 85  12/26/16 78    Physical Exam  Lab Results  Component Value Date   HGBA1C 5.5 11/17/2017    Lab Results  Component Value Date   WBC 9.1 12/23/2015   HGB 14.4 12/23/2015   HCT 40.6 12/23/2015   MCV 92.1 12/23/2015   PLT 284.0 10/01/2012    Lab Results  Component Value Date   CREATININE 0.82  11/17/2017   BUN 14 11/17/2017   NA 144 11/17/2017   K 5.2 11/17/2017   CL 105 11/17/2017   CO2 26 11/17/2017    Lab Results  Component Value Date   ALT 29 11/17/2017   AST 22 11/17/2017   ALKPHOS 60 11/17/2017   BILITOT 0.3 11/17/2017    Lab Results  Component Value Date   TSH 1.530 11/17/2017    Lab Results  Component Value Date   CHOL 199 11/17/2017   HDL 59 11/17/2017   LDLCALC 118 (H) 11/17/2017   LDLDIRECT 142.5 10/01/2012   TRIG 111 11/17/2017   CHOLHDL 3.4 11/17/2017     ASSESSMENT AND PLAN:  Crystal Bauer was seen today for wrist injury.  Diagnoses and all orders for this visit:  Fall, initial encounter  Left wrist pain -     DG Wrist Complete Left; Future    The patient is advised to call or return to clinic if she does not see an improvement in symptoms, or to seek the care of the closest emergency department if she worsens with the above plan.   Philis Fendt, MHS, PA-C [facility] [place] 12/03/2017 3:52 PM

## 2017-12-03 NOTE — Progress Notes (Signed)
12/03/2017 3:56 PM   DOB: [birth year] / MRN: [id number]  SUBJECTIVE:  Crystal Bauer is a 62 y.o. female presenting for left wrist pain starting after a fall. Symptoms present for 5 days.   She has tried wrist splint which made it worse.  She is worried about a fracture.  Comfortable with pain control. Taking vitamin D 5000 units every other day along with calcium and magnesium.  She is allergic to erythromycin and quinolones.   She  has a past medical history of Atrial fibrillation ([place]) (2000), Cataract, Hearing loss (2012), and History of chicken pox.    She  reports that she has quit smoking. She has never used smokeless tobacco. She reports that she does not drink alcohol or use drugs. She  reports that she does not engage in sexual activity. The patient  has a past surgical history that includes Tonsillectomy and adenoidectomy (1962) and Cataract extraction.  Her family history includes Cancer in her mother; Colon cancer (age of onset: 61) in her mother; Diabetes in her brother and maternal grandmother; Stroke (age of onset: 49) in her mother.  Review of Systems  Musculoskeletal: Positive for falls and joint pain. Negative for back pain, myalgias and neck pain.  Neurological: Negative for dizziness.    The problem list and medications were reviewed and updated by myself where necessary and exist elsewhere in the encounter.   OBJECTIVE:  BP (!) 93/58   Pulse 76   Temp 98.1 F (36.7 C) (Oral)   Resp 17   Ht 5' 8.5" (1.74 m)   Wt 187 lb (84.8 kg)   SpO2 98%   BMI 28.02 kg/m   Wt Readings from Last 3 Encounters:  12/03/17 187 lb (84.8 kg)  11/17/17 184 lb (83.5 kg)  12/26/16 183 lb (83 kg)   Temp Readings from Last 3 Encounters:  12/03/17 98.1 F (36.7 C) (Oral)  11/17/17 98.8 F (37.1 C) (Oral)  12/23/15 98.7 F (37.1 C) (Oral)   BP Readings from Last 3 Encounters:  12/03/17 (!) 93/58  11/17/17 90/62  12/26/16 108/62   Pulse Readings from Last 3  Encounters:  12/03/17 76  11/17/17 85  12/26/16 78    Physical Exam  Constitutional: She is oriented to person, place, and time. She appears well-developed.  Eyes: Pupils are equal, round, and reactive to light. EOM are normal.  Cardiovascular: Normal rate.  Pulmonary/Chest: Effort normal.  Abdominal: She exhibits no distension.  Musculoskeletal: Normal range of motion. She exhibits edema (mild swelling about left wrist.  No bruising, ROM/Strength deficit.  No snuff box TTP. ). She exhibits no tenderness or deformity.  Neurological: She is alert and oriented to person, place, and time. No cranial nerve deficit.  Skin: Skin is warm and dry. She is not diaphoretic.  Psychiatric: She has a normal mood and affect.  Vitals reviewed.     Lab Results  Component Value Date   HGBA1C 5.5 11/17/2017    Lab Results  Component Value Date   WBC 9.1 12/23/2015   HGB 14.4 12/23/2015   HCT 40.6 12/23/2015   MCV 92.1 12/23/2015   PLT 284.0 10/01/2012    Lab Results  Component Value Date   CREATININE 0.82 11/17/2017   BUN 14 11/17/2017   NA 144 11/17/2017   K 5.2 11/17/2017   CL 105 11/17/2017   CO2 26 11/17/2017    Lab Results  Component Value Date   ALT 29 11/17/2017   AST 22 11/17/2017  ALKPHOS 60 11/17/2017   BILITOT 0.3 11/17/2017    Lab Results  Component Value Date   TSH 1.530 11/17/2017    Lab Results  Component Value Date   CHOL 199 11/17/2017   HDL 59 11/17/2017   LDLCALC 118 (H) 11/17/2017   LDLDIRECT 142.5 10/01/2012   TRIG 111 11/17/2017   CHOLHDL 3.4 11/17/2017   Dg Wrist Complete Left  Result Date: 12/03/2017 CLINICAL DATA:  62 year old female with a history of fall and left wrist pain EXAM: LEFT WRIST - COMPLETE 3+ VIEW COMPARISON:  None. FINDINGS: Osteopenia. No acute displaced fracture. No radiopaque foreign body. No focal soft tissue swelling. Carpal bones maintain alignment. Degenerative changes of the first carpometacarpal joint. IMPRESSION: No  acute bony abnormality. Degenerative changes of the first carpometacarpal joint. Electronically Signed   By: Crystal Bauer D.O.   On: 12/03/2017 15:34      ASSESSMENT AND PLAN:  Crystal Bauer was seen today for wrist injury.  Diagnoses and all orders for this visit:  Fall, initial encounter  Left wrist pain: Likely sprain. RICE.  OTC pain control.  -     DG Wrist Complete Left; Future    The patient is advised to call or return to clinic if she does not see an improvement in symptoms, or to seek the care of the closest emergency department if she worsens with the above plan.   Crystal Bauer, MHS, PA-C [facility] 12/03/2017 3:56 PM

## 2017-12-31 ENCOUNTER — Other Ambulatory Visit: Payer: Self-pay | Admitting: Internal Medicine

## 2018-01-05 ENCOUNTER — Encounter: Payer: Self-pay | Admitting: Internal Medicine

## 2018-01-05 ENCOUNTER — Ambulatory Visit: Payer: 59 | Admitting: Internal Medicine

## 2018-01-05 VITALS — BP 104/68 | HR 75 | Ht 68.5 in | Wt 187.0 lb

## 2018-01-05 DIAGNOSIS — D352 Benign neoplasm of pituitary gland: Secondary | ICD-10-CM | POA: Diagnosis not present

## 2018-01-05 DIAGNOSIS — E042 Nontoxic multinodular goiter: Secondary | ICD-10-CM | POA: Diagnosis not present

## 2018-01-05 DIAGNOSIS — E559 Vitamin D deficiency, unspecified: Secondary | ICD-10-CM | POA: Diagnosis not present

## 2018-01-05 DIAGNOSIS — E538 Deficiency of other specified B group vitamins: Secondary | ICD-10-CM

## 2018-01-05 LAB — TSH: TSH: 1.42 u[IU]/mL (ref 0.35–4.50)

## 2018-01-05 LAB — T4, FREE: Free T4: 0.88 ng/dL (ref 0.60–1.60)

## 2018-01-05 LAB — VITAMIN D 25 HYDROXY (VIT D DEFICIENCY, FRACTURES): VITD: 31.67 ng/mL (ref 30.00–100.00)

## 2018-01-05 LAB — VITAMIN B12: VITAMIN B 12: 881 pg/mL (ref 211–911)

## 2018-01-05 NOTE — Progress Notes (Signed)
Patient ID: Crystal Bauer, female   DOB: 1955/07/21, 62 y.o.   MRN: [id number]   HPI  Crystal Bauer is a 62 y.o.-year-old female, returning for f/u for MNG, microprolactinoma, vitamin B12 and D insufficiencies. Last visit 1 year ago.  Multinodular goiter: Reviewed history: Patient complained of an enlarging left lower neck mass which was palpated at a visit with her PCP.  Thyroid U/S on 02/04/2013 showed bilateral thyroid nodules, small, less than 5 mm in the right lobe, and 3 larger nodules in the left lobe. The largest lesion is a 1.7 x 1.0 x 1.3 cm cyst situated in the left upper lobe, with post-acoustic shadowing, and colloid particles on the periphery.  Reviewed patient's TFTs-all normal: Lab Results  Component Value Date   TSH 1.530 11/17/2017   TSH 0.92 12/26/2016   TSH 0.78 12/23/2015   TSH 0.63 03/06/2015   TSH 1.10 12/02/2014   TSH 0.39 03/04/2014   TSH 0.63 02/26/2013   TSH 0.86 10/01/2012   FREET4 0.78 03/06/2015   FREET4 0.87 03/04/2014   FREET4 0.85 02/26/2013    Pt denies: - feeling nodules in neck - hoarseness - dysphagia - choking - SOB with lying down  Micro-prolactinoma: - dx in 1992 She was initially on Cabergoline >> stopped in 2000 as her microprolactinoma shrank. PRL in ~2004 minimally elevated. (Off cabergoline)  We tried to stay off Cabergoline in 2016, but she started to have L breast milky discharge + breast pain (lump). We checked PRL  >> increased to 122 >> started back on Cabergoline 0.25 mg bid >> levels improved >> decreased to once weekly in 06/2015.  However, since then, we increase it again to twice weekly at last visit a year ago. She decreased this to once a week b/c dizziness and palpitations.  Pt denies: - HAs - Visual abnormalities - Galactorrhea - weight gain.  Before last visit, she lost 20 lbs (intentional: diet and exercise), weight is fairly stable since then.  Reviewed prolactin levels: Lab Results  Component  Value Date   PROLACTIN 38.5 (H) 12/26/2016   PROLACTIN 24.1 12/27/2015   PROLACTIN 13.4 07/07/2015   PROLACTIN 57.1 (H) 03/06/2015   PROLACTIN 122.2 12/02/2014   PROLACTIN 99.8 03/04/2014   PROLACTIN 76.0 02/26/2013   Pituitary labs were normal in 02/2013: Somatomedin (IGF-I)     31 - 179 ng/mL 119  C206 ACTH     10 - 46 pg/mL 21  Cortisol, Plasma      12.6  FSH      83.9  LH      35.53   MRI (03/09/2014): Pituitary microadenoma, 6 mm.  She is now postmenopausal.   Latest vitamin D level was normal: Lab Results  Component Value Date   VD25OH 36.12 12/26/2016   VD25OH 39.05 02/26/2016   VD25OH 27.08 (L) 12/27/2015   She continues 2000 units vitamin D daily.  Vitamin B12 level improved after starting on B12 supplement: Lab Results  Component Value Date   VITAMINB12 >1500 (H) 12/26/2016   [id number] 573 02/26/2016   VITAMINB12 254 12/27/2015   She continues 2000 mcg B12 every other daily.  ROS: Constitutional: no weight gain/no weight loss, no fatigue, no subjective hyperthermia, no subjective hypothermia Eyes: no blurry vision, no xerophthalmia ENT: no sore throat, + see HPI Cardiovascular: no CP/no SOB/no palpitations/no leg swelling Respiratory: no cough/no SOB/no wheezing Gastrointestinal: no N/no V/no D/no C/no acid reflux Musculoskeletal: no muscle aches/no joint aches Skin: no rashes, no hair loss Neurological:  no tremors/no numbness/no tingling/no dizziness  I reviewed pt's medications, allergies, PMH, social hx, family hx, and changes were documented in the history of present illness. Otherwise, unchanged from my initial visit note.  Past Medical History:  Diagnosis Date  . Atrial fibrillation ([place]) 2000   from stress/increased caffeine use  . Cataract   . 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 History of chicken pox    Past Surgical History:  Procedure Laterality Date  . CATARACT EXTRACTION    . TONSILLECTOMY AND ADENOIDECTOMY  1962   Social  History   Socioeconomic History  . Marital status: Divorced    Spouse name: Not on file  . Number of children: Not on file  . Years of education: 12+  . Highest education level: Not on file  Occupational History  . Occupation: Programmer, multimedia: [employer]   Social Needs  . Financial resource strain: Not on file  . Food insecurity:    Worry: Not on file    Inability: Not on file  . Transportation needs:    Medical: Not on file    Non-medical: Not on file  Tobacco Use  . Smoking status: Former Research scientist (life sciences)  . Smokeless tobacco: Never Used  Substance and Sexual Activity  . Alcohol use: No  . Drug use: No  . Sexual activity: Never  Lifestyle  . Physical activity:    Days per week: Not on file    Minutes per session: Not on file  . Stress: Not on file  Relationships  . Social connections:    Talks on phone: Not on file    Gets together: Not on file    Attends religious service: Not on file    Active member of club or organization: Not on file    Attends meetings of clubs or organizations: Not on file    Relationship status: Not on file  . Intimate partner violence:    Fear of current or ex partner: Not on file    Emotionally abused: Not on file    Physically abused: Not on file    Forced sexual activity: Not on file  Other Topics Concern  . Not on file  Social History Narrative   Regular exercise-no   Caffeine Use-yes   Current Outpatient Medications on File Prior to Visit  Medication Sig Dispense Refill  . cabergoline (DOSTINEX) 0.5 MG tablet TAKE 1/2 TABLET BY MOUTH ONCE A WEEK. 8 tablet 0  . Cholecalciferol (VITAMIN D3) 5000 UNITS TABS Take 1 tablet by mouth every other day.      No current facility-administered medications on file prior to visit.    Allergies  Allergen Reactions  . Erythromycin Shortness Of Breath  . Quinolones Other (See Comments)    Insomnia    Family History  Problem Relation Age of Onset  . Cancer Mother        breast,  colon  . Colon cancer Mother 74  . Stroke Mother 62       10/18/2017  . Diabetes Maternal Grandmother   . Diabetes Brother        Type 2 Diabetes   PE: BP 104/68   Pulse 75   Ht 5' 8.5" (1.74 m)   Wt 187 lb (84.8 kg)   SpO2 98%   BMI 28.02 kg/m  Wt Readings from Last 3 Encounters:  01/05/18 187 lb (84.8 kg)  12/03/17 187 lb (84.8 kg)  11/17/17 184 lb (83.5 kg)  Constitutional: Slightly overweight, in NAD Eyes: PERRLA, EOMI, no exophthalmos ENT: moist mucous membranes, no thyromegaly, but palpable thyroid nodule left lobe, no cervical lymphadenopathy Cardiovascular: RRR, No RG, +1/6 SEM Respiratory: CTA B Gastrointestinal: abdomen soft, NT, ND, BS+ Musculoskeletal: no deformities, strength intact in all 4 Skin: moist, warm, no rashes Neurological: no tremor with outstretched hands, DTR normal in all 4  ASSESSMENT: 1. MNG - thyroid U/S 02/04/2013:  Right thyroid lobe - 4.2 x 1.5 x 1.1 cm. Only small right thyroid nodules are present of no more than 5 mm in diameter.   Left thyroid lobe - 4.9 x 2.1 x 1.9 cm. Multiple left thyroid nodules are present. The largest is complex but primarily cystic in the upper pole of the left lobe measuring 1.7 x 1.0 x 1.3 cm. Peripheral calcifications are noted within this cystic lesion. (these do appear to be not calcifications but inspissated colloid due to presence of comet tail artifact. Nodules in the lower pole of the left lobe measure 1.2 x 0.7 x 1.1 cm, and 1.0 x 0.8 x 1.8 cm. One of them is a cyst.  Isthmus  -  Thickness: 3 mm in thickness. No nodules visualized.   Lymphadenopathy  - None visualized.  2. History of microprolactinoma - dx 1992 - previously on cabergoline until 2000, when she took herself off it due to palpitations - prolactin check in ~2004 was mildly elevated - has 3 children - she is now postmenopausal - no osteoporosis - MRI (03/09/2014): 1. Findings suspicious for a 6 mm focus of hypo enhancing soft  tissue  arising somewhat exophyticly from the caudal aspect of the  gland, suspicious for a microadenoma in this setting. 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  2. Otherwise normal pituitary imaging.  3. Otherwise normal for age MRI appearance of the brain.   3. Vit D insuff  4. Low Vit B12    PLAN: 1. MNG  - Reviewed the report of her latest thyroid ultrasound: She has a left-sided cyst with coagulated colloid - No neck compression symptoms - Repeat thyroid ultrasound is only indicated if she develops neck compression symptoms - Reviewed her most recent TFTs and they were normal in 11/2016 - will repeat TFTs today - I'll see her back in 1 year  2. Microprolactinoma -She has a 6 mm micro-prolactinoma, with normal pituitary hormones.  There is no need to repeat the MRI over one evaluation in the future unless the prolactin is not improving on treatment -At last visit, the prolactin level was still slightly high so we increased her cabergoline from 0.25 mg once a week to twice a week, but she decreased the dose back to once a week 2/2 dizziness and palpitations. She is taking the med at night. -She is not having any side effects from cabergoline at the current dose.: No headaches, no dizziness, no nausea.  Also, she has no breast tenderness or galactorrhea -Check her prolactin today  3. Vit D insuff -Continues on 2000 units daily -We will recheck her level today.  4. Low Vit B12  -Continues on vitamin B12 2000 mcg qod -She is feeling much better on the supplement -Latest level was not low -We will recheck her level today  Component     Latest Ref Rng & Units 01/05/2018  Prolactin     ng/mL 35.7 (H)  TSH     0.35 - 4.50 uIU/mL 1.42  T4,Free(Direct)  0.60 - 1.60 ng/dL 0.88  Vitamin B12     211 - 911 pg/mL 881  VITD     30.00 - 100.00 ng/mL 31.67   Labs are normal, with the exception of a slightly high prolactin.  Due to  previous intolerance to the higher dose of cabergoline, we can continue with the 0.25 mg cabergoline weekly for now.  Philemon Kingdom, MD PhD [facility] Endocrinology

## 2018-01-05 NOTE — Patient Instructions (Signed)
Please stop at the lab.  Please continue Cabergoline 0.25 mg 2x a week.  Continue vitamin D 2000 IU daily and vitamin B12 1000 mcg daily.  Please return in 1 year.

## 2018-01-06 ENCOUNTER — Encounter: Payer: Self-pay | Admitting: Family Medicine

## 2018-01-06 LAB — PROLACTIN: PROLACTIN: 35.7 ng/mL — AB

## 2018-01-14 ENCOUNTER — Encounter: Payer: Self-pay | Admitting: Physician Assistant

## 2018-01-14 LAB — HM MAMMOGRAPHY

## 2018-01-20 ENCOUNTER — Other Ambulatory Visit: Payer: Self-pay

## 2018-01-20 NOTE — Progress Notes (Signed)
Letter verbal - findings were given to patient at the time of exam BI-RADS 2: Benign

## 2018-02-11 ENCOUNTER — Other Ambulatory Visit: Payer: Self-pay

## 2018-02-11 MED ORDER — CABERGOLINE 0.5 MG PO TABS: 0.2500 mg | ORAL_TABLET | ORAL | 0 refills | Status: DC

## 2018-04-03 ENCOUNTER — Other Ambulatory Visit: Payer: Self-pay

## 2018-04-03 MED ORDER — CABERGOLINE 0.5 MG PO TABS: 0.2500 mg | ORAL_TABLET | ORAL | 0 refills | Status: DC

## 2018-06-12 ENCOUNTER — Other Ambulatory Visit: Payer: Self-pay

## 2018-06-12 MED ORDER — CABERGOLINE 0.5 MG PO TABS: 0.2500 mg | ORAL_TABLET | ORAL | 2 refills | Status: DC

## 2019-01-05 ENCOUNTER — Other Ambulatory Visit: Payer: Self-pay

## 2019-01-07 ENCOUNTER — Ambulatory Visit (INDEPENDENT_AMBULATORY_CARE_PROVIDER_SITE_OTHER): Payer: BLUE CROSS/BLUE SHIELD | Admitting: Internal Medicine

## 2019-01-07 ENCOUNTER — Encounter: Payer: Self-pay | Admitting: Internal Medicine

## 2019-01-07 ENCOUNTER — Other Ambulatory Visit: Payer: Self-pay

## 2019-01-07 VITALS — BP 118/70 | HR 90 | Ht 67.72 in | Wt 202.0 lb

## 2019-01-07 DIAGNOSIS — D352 Benign neoplasm of pituitary gland: Secondary | ICD-10-CM | POA: Diagnosis not present

## 2019-01-07 DIAGNOSIS — E559 Vitamin D deficiency, unspecified: Secondary | ICD-10-CM

## 2019-01-07 DIAGNOSIS — E538 Deficiency of other specified B group vitamins: Secondary | ICD-10-CM

## 2019-01-07 DIAGNOSIS — E042 Nontoxic multinodular goiter: Secondary | ICD-10-CM | POA: Diagnosis not present

## 2019-01-07 LAB — VITAMIN D 25 HYDROXY (VIT D DEFICIENCY, FRACTURES): VITD: 27.35 ng/mL — ABNORMAL LOW (ref 30.00–100.00)

## 2019-01-07 LAB — TSH: TSH: 0.92 u[IU]/mL (ref 0.35–4.50)

## 2019-01-07 LAB — VITAMIN B12: Vitamin B-12: 380 pg/mL (ref 211–911)

## 2019-01-07 NOTE — Progress Notes (Signed)
Patient ID: Crystal Bauer, female   DOB: 11-Sep-1955, 63 y.o.   MRN: [id number]   HPI  Crystal Bauer is a 63 y.o.-year-old female, returning for f/u for MNG, microprolactinoma, vitamin B12, and D insufficiencies. Last visit 1 year ago.  She retired since last visit.  Multinodular goiter: Reviewed history: Patient complained of an enlarging left lower neck mass which was palpated at a visit with her PCP.  Thyroid U/S on 02/04/2013 showed bilateral thyroid nodules, small, less than 5 mm in the right lobe, and 3 larger nodules in the left lobe. The largest lesion is a 1.7 x 1.0 x 1.3 cm cyst situated in the left upper lobe, with post-acoustic shadowing, and colloid particles on the periphery.  Reviewed her TFTs: Lab Results  Component Value Date   TSH 1.42 01/05/2018   TSH 1.530 11/17/2017   TSH 0.92 12/26/2016   TSH 0.78 12/23/2015   TSH 0.63 03/06/2015   TSH 1.10 12/02/2014   TSH 0.39 03/04/2014   TSH 0.63 02/26/2013   TSH 0.86 10/01/2012   FREET4 0.88 01/05/2018   FREET4 0.78 03/06/2015   FREET4 0.87 03/04/2014   FREET4 0.85 02/26/2013    Pt denies: - feeling nodules in neck - hoarseness - dysphagia - choking - SOB with lying down  Micro-prolactinoma: - dx in 1992 She was initially on Cabergoline >> stopped in 2000 as her microprolactinoma shrank. PRL in ~2004 minimally elevated (Off Cabergoline).  We tried to stay off Cabergoline in 2016, but she started to have L breast milky discharge + breast pain (lump). We checked PRL  >> increased to 122 >> started back on Cabergoline 0.25 mg bid >> levels improved >> decreased to once weekly in 06/2015 due to dizziness and palpitations.  However, afterwards, we tried again to increase it to twice a week and she is now tolerating this dose well.  She denies headaches, visual abnormalities, galactorrhea, weight gain.  Reviewed her prolactin levels: Lab Results  Component Value Date   PROLACTIN 35.7 (H) 01/05/2018    PROLACTIN 38.5 (H) 12/26/2016   PROLACTIN 24.1 12/27/2015   PROLACTIN 13.4 07/07/2015   PROLACTIN 57.1 (H) 03/06/2015   PROLACTIN 122.2 12/02/2014   PROLACTIN 99.8 03/04/2014   PROLACTIN 76.0 02/26/2013   Pituitary labs were normal in 02/2013: Somatomedin (IGF-I)     31 - 179 ng/mL 119  C206 ACTH     10 - 46 pg/mL 21  Cortisol, Plasma      12.6  FSH      83.9  LH      35.53   MRI (03/09/2014): Pituitary microadenoma measuring 6 mm.  She is now postmenopausal.   Latest vitamin D level was normal: Lab Results  Component Value Date   VD25OH 31.67 01/05/2018   VD25OH 36.12 12/26/2016   VD25OH 39.05 02/26/2016   VD25OH 27.08 (L) 12/27/2015   She continues on 2000 units vitamin D daily.  Vitamin B12 level improved after starting B12 supplement: Lab Results  Component Value Date   VITAMINB12 881 01/05/2018   VITAMINB12 >1500 (H) 12/26/2016   VITAMINB12 573 02/26/2016   VITAMINB12 254 12/27/2015   She was on 2000 mcg B12 every other day, now on B complex daily.  ROS: Constitutional: + weight gain/no weight loss, no fatigue, no subjective hyperthermia, no subjective hypothermia Eyes: no blurry vision, no xerophthalmia ENT: no sore throat, + see HPI Cardiovascular: no CP/no SOB/no palpitations/no leg swelling Respiratory: no cough/no SOB/no wheezing Gastrointestinal: no N/no V/no D/no C/no  acid reflux Musculoskeletal: no muscle aches/no joint aches Skin: no rashes, no hair loss Neurological: no tremors/no numbness/no tingling/no dizziness  I reviewed pt's medications, allergies, PMH, social hx, family hx, and changes were documented in the history of present illness. Otherwise, unchanged from my initial visit note.  Past Medical History:  Diagnosis Date  . Atrial fibrillation ([place]) 2000   from stress/increased caffeine use  . Cataract   . 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 History of chicken pox    Past Surgical History:  Procedure Laterality Date  . CATARACT  EXTRACTION    . TONSILLECTOMY AND ADENOIDECTOMY  1962   Social History   Socioeconomic History  . Marital status: Divorced    Spouse name: Not on file  . Number of children: Not on file  . Years of education: 12+  . Highest education level: Not on file  Occupational History  . Occupation: Programmer, multimedia: [employer]   Social Needs  . Financial resource strain: Not on file  . Food insecurity    Worry: Not on file    Inability: Not on file  . Transportation needs    Medical: Not on file    Non-medical: Not on file  Tobacco Use  . Smoking status: Former Research scientist (life sciences)  . Smokeless tobacco: Never Used  Substance and Sexual Activity  . Alcohol use: No  . Drug use: No  . Sexual activity: Never  Lifestyle  . Physical activity    Days per week: Not on file    Minutes per session: Not on file  . Stress: Not on file  Relationships  . Social Herbalist on phone: Not on file    Gets together: Not on file    Attends religious service: Not on file    Active member of club or organization: Not on file    Attends meetings of clubs or organizations: Not on file    Relationship status: Not on file  . Intimate partner violence    Fear of current or ex partner: Not on file    Emotionally abused: Not on file    Physically abused: Not on file    Forced sexual activity: Not on file  Other Topics Concern  . Not on file  Social History Narrative   Regular exercise-no   Caffeine Use-yes   Current Outpatient Medications on File Prior to Visit  Medication Sig Dispense Refill  . cabergoline (DOSTINEX) 0.5 MG tablet Take 0.5 tablets (0.25 mg total) by mouth 2 (two) times a week. 24 tablet 2   No current facility-administered medications on file prior to visit.    Allergies  Allergen Reactions  . Erythromycin Shortness Of Breath  . Quinolones Other (See Comments)    Insomnia    Family History  Problem Relation Age of Onset  . Cancer Mother        breast,  colon  . Colon cancer Mother 39  . Stroke Mother 96       10/18/2017  . Diabetes Maternal Grandmother   . Diabetes Brother        Type 2 Diabetes   PE: BP 118/70   Pulse 90   Ht 5' 7.72" (1.72 m) Comment: measured without shoes  Wt 202 lb (91.6 kg)   SpO2 98%   BMI 30.97 kg/m  Wt Readings from Last 3 Encounters:  01/07/19 202 lb (91.6 kg)  01/05/18 187 lb (84.8 kg)  12/03/17 187  lb (84.8 kg)   Constitutional: overweight, in NAD Eyes: PERRLA, EOMI, no exophthalmos ENT: moist mucous membranes, no thyromegaly, but palpable thyroid nodule in left lobe no cervical lymphadenopathy Cardiovascular: RRR, No RG, +1/6 SEM Respiratory: CTA B Gastrointestinal: abdomen soft, NT, ND, BS+ Musculoskeletal: no deformities, strength intact in all 4 Skin: moist, warm, no rashes Neurological: no tremor with outstretched hands, DTR normal in all 4  ASSESSMENT: 1. MNG - thyroid U/S 02/04/2013:  Right thyroid lobe - 4.2 x 1.5 x 1.1 cm. Only small right thyroid nodules are present of no more than 5 mm in diameter.   Left thyroid lobe - 4.9 x 2.1 x 1.9 cm. Multiple left thyroid nodules are present. The largest is complex but primarily cystic in the upper pole of the left lobe measuring 1.7 x 1.0 x 1.3 cm. Peripheral calcifications are noted within this cystic lesion. (these do appear to be not calcifications but inspissated colloid due to presence of comet tail artifact. Nodules in the lower pole of the left lobe measure 1.2 x 0.7 x 1.1 cm, and 1.0 x 0.8 x 1.8 cm. One of them is a cyst.  Isthmus  -  Thickness: 3 mm in thickness. No nodules visualized.   Lymphadenopathy  - None visualized.  2. History of microprolactinoma - dx 1992 - previously on cabergoline until 2000, when she took herself off it due to palpitations.  We restarted these afterwards and she is currently on 25 mcg twice a week - prolactin check in ~2004 was mildly elevated - has 3 children - she is now postmenopausal - no  osteoporosis - MRI (03/09/2014): 1. Findings suspicious for a 6 mm focus of hypo enhancing soft  tissue arising somewhat exophyticly from the caudal aspect of the  gland, suspicious for a microadenoma in this setting. 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  2. Otherwise normal pituitary imaging.  3. Otherwise normal for age MRI appearance of the brain.   3. Vit D insuff  4. Low Vit B12    PLAN: 1. MNG  -Reviewed the report of her latest thyroid ultrasound: She has a left-sided thyroid cyst with coagulated colloid -No neck compression symptoms -No repeat thyroid ultrasound needed for now -Latest TFTs were normal at last visit -We will repeat TSH today -I will see her back in a year  2. Microprolactinoma -She has a history of a 6 mm macroprolactinoma with normal pituitary hormones.  There is no need to repeat the MRI unless the prolactin is not improving on treatment -At last 2 visits visit, prolactin level was slightly high but we did not change her regimen -In the past, she could not tolerate the twice a week Cabergoline regimen because she had dizziness and palpitations.  However, since then, she was able to increase it back to twice a week without side effects.   -We discussed that since she is postmenopausal, it is unlikely that the slightly high level is clinically important especially since she has no breast tenderness, galactorrhea, or headaches. -We will recheck a prolactin level today  3. Vit D insuff -Continues on 2000 units daily -Level was normal at last visit -Recheck level today  4. Low Vit B12  -was on vitamin B12 2000 mcg every other day >> now on B complex -She feels much better after she started a supplement -Latest level was normal at the previous visit -Recheck level today  Component     Latest Ref Rng &  Units 01/07/2019  Prolactin     ng/mL 33.2 (H)  TSH     0.35 - 4.50 uIU/mL 0.92  Vitamin B12      211 - 911 pg/mL 380  VITD     30.00 - 100.00 ng/mL 27.35 (L)   Prolactin is only very slightly high, but a little improved from previous level.  We will continue the current dose of cabergoline TSH is normal.   B12 is also normal.   Vitamin D is slightly low.  If missing doses, I would suggest to take this every day.  If not, we may need to increase the dose to 4000 units daily.  Philemon Kingdom, MD PhD [facility] Endocrinology

## 2019-01-07 NOTE — Patient Instructions (Signed)
Please stop at the lab.  Please continue Cabergoline 0.25 mg 2x a week.  Continue vitamin D 2000 IU daily and vitamin B12 1000 mcg daily.  Please return in 1 year.

## 2019-01-08 LAB — PROLACTIN: Prolactin: 33.2 ng/mL — ABNORMAL HIGH

## 2019-07-21 ENCOUNTER — Other Ambulatory Visit: Payer: Self-pay

## 2019-07-21 ENCOUNTER — Ambulatory Visit: Payer: 59 | Admitting: Family Medicine

## 2019-07-21 ENCOUNTER — Encounter: Payer: Self-pay | Admitting: Family Medicine

## 2019-07-21 VITALS — BP 106/69 | HR 94 | Temp 98.2°F | Resp 15 | Ht 68.0 in | Wt 206.0 lb

## 2019-07-21 DIAGNOSIS — N3941 Urge incontinence: Secondary | ICD-10-CM

## 2019-07-21 DIAGNOSIS — R3915 Urgency of urination: Secondary | ICD-10-CM | POA: Diagnosis not present

## 2019-07-21 LAB — POCT URINALYSIS DIP (MANUAL ENTRY)
Bilirubin, UA: NEGATIVE
Glucose, UA: NEGATIVE mg/dL
Ketones, POC UA: NEGATIVE mg/dL
Leukocytes, UA: NEGATIVE
Nitrite, UA: NEGATIVE
Protein Ur, POC: NEGATIVE mg/dL
Spec Grav, UA: 1.025 (ref 1.010–1.025)
Urobilinogen, UA: 0.2 E.U./dL
pH, UA: 7 (ref 5.0–8.0)

## 2019-07-21 LAB — POC MICROSCOPIC URINALYSIS (UMFC): Mucus: ABSENT

## 2019-07-21 NOTE — Progress Notes (Signed)
1 

## 2019-07-21 NOTE — Progress Notes (Signed)
Established Patient Office Visit  Subjective:  Patient ID: Crystal Bauer, female    DOB: [birth year]  Age: 64 y.o. MRN: [id number]  CC:  Chief Complaint  Patient presents with  . Dysuria    pt reports urgency and frequency  x 1 month, back pain intermitent, denies burning or pain     HPI Crystal Bauer presents for   Bilateral lower back pain for 1 week then resolved then returned a month later She states that over the weekend she had a massage She no longer has low back soreness   She reports that she six months of urgency She reports that by the time she feels the urge to go she feels like she has to go and so when she starts to walk to the bathroom she already leaking urine. No constipation.  She had 3 pregnancies She had a 8 pounds 4 ounces, NSVD She had a forceps delivery with her first child who was 7 lbs 11 oz. With each pregnancy she had to get episiotomy repair.  She reports that she has no problem with leakage of urine with sneezing, coughing or laughing She denies bed wetting She does kegels She drinks 3 cups of coffee daily  Wt Readings from Last 3 Encounters:  07/21/19 206 lb (93.4 kg)  01/07/19 202 lb (91.6 kg)  01/05/18 187 lb (84.8 kg)     Past Medical History:  Diagnosis Date  . Atrial fibrillation ([place]) 2000   from stress/increased caffeine use  . Cataract   . 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 History of chicken pox     Past Surgical History:  Procedure Laterality Date  . CATARACT EXTRACTION    . TONSILLECTOMY AND ADENOIDECTOMY  1962    Family History  Problem Relation Age of Onset  . Cancer Mother        breast, colon  . Colon cancer Mother 80  . Stroke Mother 47       10/18/2017  . Diabetes Maternal Grandmother   . Diabetes Brother        Type 2 Diabetes    Social History   Socioeconomic History  . Marital status: Divorced    Spouse name: Not on file  . Number of children: Not on file  . Years of education: 12+  .  Highest education level: Not on file  Occupational History  . Occupation: Programmer, multimedia: [employer]   Tobacco Use  . Smoking status: Former Research scientist (life sciences)  . Smokeless tobacco: Never Used  Substance and Sexual Activity  . Alcohol use: No  . Drug use: No  . Sexual activity: Never  Other Topics Concern  . Not on file  Social History Narrative   Regular exercise-no   Caffeine Use-yes   Social Determinants of Health   Financial Resource Strain:   . Difficulty of Paying Living Expenses: Not on file  Food Insecurity:   . Worried About Charity fundraiser in the Last Year: Not on file  . Ran Out of Food in the Last Year: Not on file  Transportation Needs:   . Lack of Transportation (Medical): Not on file  . Lack of Transportation (Non-Medical): Not on file  Physical Activity:   . Days of Exercise per Week: Not on file  . Minutes of Exercise per Session: Not on file  Stress:   . Feeling of Stress : Not on file  Social Connections:   . Frequency  of Communication with Friends and Family: Not on file  . Frequency of Social Gatherings with Friends and Family: Not on file  . Attends Religious Services: Not on file  . Active Member of Clubs or Organizations: Not on file  . Attends Archivist Meetings: Not on file  . Marital Status: Not on file  Intimate Partner Violence:   . Fear of Current or Ex-Partner: Not on file  . Emotionally Abused: Not on file  . Physically Abused: Not on file  . Sexually Abused: Not on file    Outpatient Medications Prior to Visit  Medication Sig Dispense Refill  . cabergoline (DOSTINEX) 0.5 MG tablet Take 0.5 tablets (0.25 mg total) by mouth 2 (two) times a week. 24 tablet 2  . cholecalciferol (VITAMIN D3) 25 MCG (1000 UT) tablet Take 2,000 Units by mouth daily.     No facility-administered medications prior to visit.    Allergies  Allergen Reactions  . Erythromycin Shortness Of Breath  . Quinolones Other (See Comments)     Insomnia     ROS Review of Systems See hpi Review of Systems  Constitutional: Negative for activity change, appetite change, chills and fever.  HENT: Negative for congestion, nosebleeds, trouble swallowing and voice change.   Respiratory: Negative for cough, shortness of breath and wheezing.   Gastrointestinal: Negative for diarrhea, nausea and vomiting.  Genitourinary: see hppi Musculoskeletal: see hpi Neurological: Negative for dizziness, speech difficulty, light-headedness and numbness.  See HPI. All other review of systems negative.     Objective:    Physical Exam  BP 106/69   Pulse 94   Temp 98.2 F (36.8 C) (Temporal)   Resp 15   Ht 5\' 8"  (1.727 m)   Wt 206 lb (93.4 kg)   SpO2 96%   BMI 31.32 kg/m  Wt Readings from Last 3 Encounters:  07/21/19 206 lb (93.4 kg)  01/07/19 202 lb (91.6 kg)  01/05/18 187 lb (84.8 kg)   Physical Exam  Constitutional: Oriented to person, place, and time. Appears well-developed and well-nourished.  HENT:  Head: Normocephalic and atraumatic.  Eyes: Conjunctivae and EOM are normal.  Cardiovascular: Normal rate, regular rhythm, normal heart sounds and intact distal pulses.  No murmur heard. Pulmonary/Chest: Effort normal and breath sounds normal. No stridor. No respiratory distress. Has no wheezes.  Abdomen: nondistended, normoactive bs, soft, nontender Back: low back without SI joint pain, no spasms, no deformity Neurological: Is alert and oriented to person, place, and time.  Skin: Skin is warm. Capillary refill takes less than 2 seconds.  Psychiatric: Has a normal mood and affect. Behavior is normal. Judgment and thought content normal.    There are no preventive care reminders to display for this patient.  There are no preventive care reminders to display for this patient.  Lab Results  Component Value Date   TSH 0.92 01/07/2019   Lab Results  Component Value Date   WBC 9.1 12/23/2015   HGB 14.4 12/23/2015   HCT 40.6  12/23/2015   MCV 92.1 12/23/2015   PLT 284.0 10/01/2012   Lab Results  Component Value Date   NA 144 11/17/2017   K 5.2 11/17/2017   CO2 26 11/17/2017   GLUCOSE 91 11/17/2017   BUN 14 11/17/2017   CREATININE 0.82 11/17/2017   BILITOT 0.3 11/17/2017   ALKPHOS 60 11/17/2017   AST 22 11/17/2017   ALT 29 11/17/2017   PROT 6.7 11/17/2017   ALBUMIN 4.4 11/17/2017   CALCIUM 9.3 11/17/2017  GFR 87.55 10/01/2012   Lab Results  Component Value Date   CHOL 199 11/17/2017   Lab Results  Component Value Date   HDL 59 11/17/2017   Lab Results  Component Value Date   LDLCALC 118 (H) 11/17/2017   Lab Results  Component Value Date   TRIG 111 11/17/2017   Lab Results  Component Value Date   CHOLHDL 3.4 11/17/2017   Lab Results  Component Value Date   HGBA1C 5.5 11/17/2017      Assessment & Plan:   Problem List Items Addressed This Visit    None    Visit Diagnoses    Urinary urgency    -  Primary   Relevant Orders   POCT Microscopic Urinalysis ([facility]) (Completed)   POCT urinalysis dipstick (Completed)   Urine Culture   Urge incontinence of urine       Relevant Orders   Urine Culture   Ambulatory referral to Urology    Advised pt to cut back on caffeine, continue kegels Gave instructions on next steps Discussed urodynamics Referral placed for Urology Discussed that pelvic floor rehab is often helpful in addition to meds  No orders of the defined types were placed in this encounter.   Follow-up: Return in about 2 months (around 09/20/2019) for fasting labs and physical exam .   A total of 15 minutes were spent face-to-face with the patient during this encounter and over half of that time was spent on counseling and coordination of care.  Forrest Moron, MD

## 2019-07-21 NOTE — Patient Instructions (Signed)
° ° ° °  If you have lab work done today you will be contacted with your lab results within the next 2 weeks.  If you have not heard from us then please contact us. The fastest way to get your results is to register for My Chart. ° ° °IF you received an x-ray today, you will receive an invoice from [facility]. Please contact [facility] at [phone] with questions or concerns regarding your invoice.  ° °IF you received labwork today, you will receive an invoice from [facility]. Please contact [facility] at [phone] with questions or concerns regarding your invoice.  ° °Our billing staff will not be able to assist you with questions regarding bills from these companies. ° °You will be contacted with the lab results as soon as they are available. The fastest way to get your results is to activate your My Chart account. Instructions are located on the last page of this paperwork. If you have not heard from us regarding the results in 2 weeks, please contact this office. °  ° ° ° °

## 2019-08-24 ENCOUNTER — Encounter: Payer: Self-pay | Admitting: Family Medicine

## 2019-09-22 ENCOUNTER — Encounter: Payer: 59 | Admitting: Family Medicine

## 2019-11-22 ENCOUNTER — Emergency Department (HOSPITAL_COMMUNITY): Payer: 59

## 2019-11-22 ENCOUNTER — Encounter (HOSPITAL_COMMUNITY): Payer: Self-pay | Admitting: Emergency Medicine

## 2019-11-22 ENCOUNTER — Other Ambulatory Visit: Payer: Self-pay

## 2019-11-22 ENCOUNTER — Emergency Department (HOSPITAL_COMMUNITY)
Admission: EM | Admit: 2019-11-22 | Discharge: 2019-11-22 | Disposition: A | Discharge status: Home / Self Care | Payer: 59 | Attending: Emergency Medicine | Admitting: Emergency Medicine

## 2019-11-22 DIAGNOSIS — R079 Chest pain, unspecified: Secondary | ICD-10-CM

## 2019-11-22 DIAGNOSIS — Z87891 Personal history of nicotine dependence: Secondary | ICD-10-CM | POA: Insufficient documentation

## 2019-11-22 LAB — CBC
HCT: 44.3 % (ref 36.0–46.0)
Hemoglobin: 14.7 g/dL (ref 12.0–15.0)
MCH: 32.7 pg (ref 26.0–34.0)
MCHC: 33.2 g/dL (ref 30.0–36.0)
MCV: 98.4 fL (ref 80.0–100.0)
Platelets: 299 10*3/uL (ref 150–400)
RBC: 4.5 MIL/uL (ref 3.87–5.11)
RDW: 13 % (ref 11.5–15.5)
WBC: 8.3 10*3/uL (ref 4.0–10.5)
nRBC: 0 % (ref 0.0–0.2)

## 2019-11-22 LAB — BASIC METABOLIC PANEL
Anion gap: 9 (ref 5–15)
BUN: 15 mg/dL (ref 8–23)
CO2: 26 mmol/L (ref 22–32)
Calcium: 9.3 mg/dL (ref 8.9–10.3)
Chloride: 107 mmol/L (ref 98–111)
Creatinine, Ser: 0.93 mg/dL (ref 0.44–1.00)
GFR calc Af Amer: >60 mL/min (ref >60)
GFR calc non Af Amer: >60 mL/min (ref >60)
Glucose, Bld: 96 mg/dL (ref 70–99)
Potassium: 4.3 mmol/L (ref 3.5–5.1)
Sodium: 142 mmol/L (ref 135–145)

## 2019-11-22 LAB — TROPONIN I (HIGH SENSITIVITY): Troponin I (High Sensitivity): 2 ng/L (ref <18)

## 2019-11-22 MED ORDER — SODIUM CHLORIDE 0.9% FLUSH: 3.0000 mL | Freq: Once | INTRAVENOUS | Status: DC

## 2019-11-22 MED ORDER — ALUM & MAG HYDROXIDE-SIMETH 200-200-20 MG/5ML PO SUSP
30.0000 mL | Freq: Once | ORAL | Status: AC
  Administered 2019-11-22: 30 mL via ORAL
  Filled 2019-11-22: qty 30

## 2019-11-22 NOTE — ED Provider Notes (Signed)
[facility] DEPT Provider Note   CSN: [id number] Arrival date & time: 11/22/19  1402     History Chief Complaint  Patient presents with   Chest Pain    Crystal Bauer is a 64 y.o. female w remote hx of a fib (resolved), presenting to the ED with complaint of left sided chest pain that woke her from sleep around 3am today. She states pain was under left breast, similar to heartburn but also like a dull knife. Pain lasted about 20 minutes, relieved without intervention. NO assoc SOB, diaphoresis, nausea, or radiation of pain. It has not recurred since. She states she drank some coke while waiting in the ED and feels some burning in her stomach now, so think this may be her stomach. She has family hx of her fathers dead due to MI at age 61. No personal hx of CAD. No hx of HTN or DM. Remote Hx of tobacco use and mildly elevated cholesterol.  The history is provided by the patient.       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     Family History  Problem Relation Age of Onset   Cancer Mother        breast, colon   Colon cancer Mother 68   Stroke Mother 30       10/18/2017   Diabetes Maternal Grandmother    Diabetes Brother        Type 2 Diabetes    Social  History   Tobacco Use   Smoking status: Former Smoker   Smokeless tobacco: Never Used  Substance Use Topics   Alcohol use: No   Drug use: No    Home Medications Prior to Admission medications   Medication Sig Start Date End Date Taking? Authorizing Provider  cabergoline (DOSTINEX) 0.5 MG tablet Take 0.5 tablets (0.25 mg total) by mouth 2 (two) times a week. 06/15/18   Philemon Kingdom, MD  cholecalciferol (VITAMIN D3) 25 MCG (1000 UT) tablet Take 2,000 Units by mouth daily.    [provider]    Allergies    Erythromycin and Quinolones  Review of Systems   Review of Systems  Cardiovascular: Positive for chest pain.  All other systems reviewed and are negative.   Physical Exam Updated Vital Signs BP 98/60    Pulse 63    Temp 98.3 F (36.8 C) (Oral)    Resp 14    Ht 5\' 8"  (1.727 m)    Wt 90.7 kg    SpO2 99%    BMI 30.41 kg/m   Physical Exam Vitals and nursing note reviewed.  Constitutional:      General: She is not in acute distress.    Appearance: She  is well-developed. She is not ill-appearing.  HENT:     Head: Normocephalic and atraumatic.  Eyes:     Conjunctiva/sclera: Conjunctivae normal.  Cardiovascular:     Rate and Rhythm: Normal rate and regular rhythm.     Heart sounds: Normal heart sounds.  Pulmonary:     Effort: Pulmonary effort is normal. No respiratory distress.     Breath sounds: Normal breath sounds.  Abdominal:     General: Bowel sounds are normal.     Palpations: Abdomen is soft.     Tenderness: There is no abdominal tenderness.  Musculoskeletal:     Right lower leg: No edema.     Left lower leg: No edema.  Skin:    General: Skin is warm.  Neurological:     Mental Status: She is alert.  Psychiatric:        Behavior: Behavior normal.     ED Results / Procedures / Treatments   Labs (all labs ordered are listed, but only abnormal results are displayed) Labs Reviewed  BASIC METABOLIC PANEL  CBC  TROPONIN I (HIGH  SENSITIVITY)    EKG None  Radiology DG Chest 2 View  Result Date: 11/22/2019 CLINICAL DATA:  64 year old female with chest pain. EXAM: CHEST - 2 VIEW COMPARISON:  None. FINDINGS: The heart size and mediastinal contours are within normal limits. Both lungs are clear. The visualized skeletal structures are unremarkable. IMPRESSION: No active cardiopulmonary disease. Electronically Signed   By: Anner Crete M.D.   On: 11/22/2019 15:08    Procedures Procedures (including critical care time)  Medications Ordered in ED Medications  sodium chloride flush (NS) 0.9 % injection 3 mL (3 mLs Intravenous Not Given 11/22/19 1620)  alum & mag hydroxide-simeth (MAALOX/MYLANTA) 200-200-20 MG/5ML suspension 30 mL (30 mLs Oral Given 11/22/19 1646)    ED Course  I have reviewed the triage vital signs and the nursing notes.  Pertinent labs & imaging results that were available during my care of the patient were reviewed by me and considered in my medical decision making (see chart for details).    MDM Rules/Calculators/A&P                         Pt presenting with 20 minutes of left sided CP that woke her from sleep at 3am, resolved without intervention, described like a heartburn vs dull pain. Chest pain is not likely of cardiac or pulmonary etiology d/t presentation, low risk wells, VSS, no tracheal deviation, no JVD or new murmur, RRR, breath sounds equal bilaterally, EKG without acute abnormalities - reviewed by myself and Dr. Darl Householder, negative troponin, and negative CXR. Suspect symptoms may be related to GERD, treated with maalox. Patient is to be discharged with recommendation to follow up with PCP in regards to today's hospital visit. Pt has been advised to return to the ED if CP becomes exertional, associated with diaphoresis or nausea, radiates to left jaw/arm, worsens or becomes concerning in any way. Pt appears reliable for follow up and is agreeable to discharge.   Final Clinical Impression(s) /  ED Diagnoses Final diagnoses:  Left-sided chest pain    Rx / DC Orders ED Discharge Orders    None       Abdulkadir Emmanuel, [place] N, PA-C 11/22/19 1740    Drenda Freeze, MD 11/23/19 0010

## 2019-11-22 NOTE — ED Triage Notes (Signed)
Pt reports when she woke up this morning she had left side chest pains that ran under her left breast with palpitations. Reports that lasted 20 minutes. Reports about to go out of town and her father died of MI at age 63 while out of town visiting family, so wanted to be checked out.

## 2019-11-22 NOTE — Discharge Instructions (Addendum)
Please read instructions below. Follow up with your primary care provider.  Return to the ER for new or worsening symptoms; including worsening chest pain, shortness of breath, pain that radiates to the arm or neck, pain or shortness of breath worsened with exertion.   

## 2020-01-06 ENCOUNTER — Encounter: Payer: Self-pay | Admitting: Internal Medicine

## 2020-01-06 ENCOUNTER — Other Ambulatory Visit: Payer: Self-pay

## 2020-01-06 ENCOUNTER — Ambulatory Visit (INDEPENDENT_AMBULATORY_CARE_PROVIDER_SITE_OTHER): Payer: 59 | Admitting: Internal Medicine

## 2020-01-06 VITALS — BP 118/70 | HR 74 | Ht 67.72 in | Wt 203.0 lb

## 2020-01-06 DIAGNOSIS — E559 Vitamin D deficiency, unspecified: Secondary | ICD-10-CM

## 2020-01-06 DIAGNOSIS — D352 Benign neoplasm of pituitary gland: Secondary | ICD-10-CM | POA: Diagnosis not present

## 2020-01-06 DIAGNOSIS — E042 Nontoxic multinodular goiter: Secondary | ICD-10-CM

## 2020-01-06 DIAGNOSIS — E538 Deficiency of other specified B group vitamins: Secondary | ICD-10-CM

## 2020-01-06 NOTE — Patient Instructions (Signed)
Please stop at the lab.  Please continue Cabergoline 0.25 mg 1x a week.  Continue vitamin D 4000 IU daily and vitamin B12 1000 mcg daily.  Please return in 1 year.

## 2020-01-06 NOTE — Progress Notes (Signed)
Crystal Bauer ID: Crystal Bauer, female   DOB: 05/03/1956, 64 y.o.   MRN: [id number]  This visit occurred during the SARS-CoV-2 public health emergency.  Safety protocols were in place, including screening questions prior to the visit, additional usage of staff PPE, and extensive cleaning of exam room while observing appropriate contact time as indicated for disinfecting solutions.   HPI  Crystal Bauer is a 64 y.o.-year-old female, returning for f/u for MNG, microprolactinoma, vitamin B12, and D insufficiencies. Last visit 1 year ago.  She was in the ED with AP and palpitations 11/22/2019. R/o for AMI by negative troponins. EKG showed atrial enlargement.  Multinodular goiter: Reviewed history: Crystal Bauer complained of an enlarging left lower neck mass which was palpated at a visit with her PCP.  Thyroid U/S on 02/04/2013 showed bilateral thyroid nodules, small, less than 5 mm in the right lobe, and 3 larger nodules in the left lobe. The largest lesion is a 1.7 x 1.0 x 1.3 cm cyst situated in the left upper lobe, with post-acoustic shadowing, and colloid particles on the periphery.  Reviewed her TFTs: Lab Results  Component Value Date   TSH 0.92 01/07/2019   TSH 1.42 01/05/2018   TSH 1.530 11/17/2017   TSH 0.92 12/26/2016   TSH 0.78 12/23/2015   TSH 0.63 03/06/2015   TSH 1.10 12/02/2014   TSH 0.39 03/04/2014   TSH 0.63 02/26/2013   TSH 0.86 10/01/2012   FREET4 0.88 01/05/2018   FREET4 0.78 03/06/2015   FREET4 0.87 03/04/2014   FREET4 0.85 02/26/2013    Pt denies: - feeling nodules in neck - hoarseness - dysphagia - choking - SOB with lying down  Micro-prolactinoma: - dx in 1992 She was initially on Cabergoline >> stopped in 2000 as her microprolactinoma shrank. PRL in ~2004 minimally elevated (Off Cabergoline).  We tried to stay off Cabergoline in 2016, but she started to have L breast milky discharge + breast pain (lump). We checked PRL  >> increased to 122 >> started  back on Cabergoline 0.25 mg bid >> levels improved >> decreased to once weekly in 06/2015 due to dizziness and palpitations.  However, afterwards, we tried again to increase it to twice a week but 1 mo ago >> reduced to 0.25 mg weekly 2/2 concerns of palpitations.  She denies headaches, visual abnormalities, galactorrhea, weight gain.  Reviewed her prolactin levels: Lab Results  Component Value Date   PROLACTIN 33.2 (H) 01/07/2019   PROLACTIN 35.7 (H) 01/05/2018   PROLACTIN 38.5 (H) 12/26/2016   PROLACTIN 24.1 12/27/2015   PROLACTIN 13.4 07/07/2015   PROLACTIN 57.1 (H) 03/06/2015   PROLACTIN 122.2 12/02/2014   PROLACTIN 99.8 03/04/2014   PROLACTIN 76.0 02/26/2013   Pituitary labs were normal in 02/2013: Somatomedin (IGF-I)     31 - 179 ng/mL 119  C206 ACTH     10 - 46 pg/mL 21  Cortisol, Plasma      12.6  FSH      83.9  LH      35.53   MRI (03/09/2014): Pituitary microadenoma measuring 6 mm.  She is now postmenopausal.   Latest vitamin D level was normal: Lab Results  Component Value Date   VD25OH 27.35 (L) 01/07/2019   VD25OH 31.67 01/05/2018   VD25OH 36.12 12/26/2016   VD25OH 39.05 02/26/2016   VD25OH 27.08 (L) 12/27/2015   She continues on 4000 units vitamin D daily, increased since last OV.  Vitamin B12 level improved after starting B12 supplement: Lab Results  Component  Value Date   VITAMINB12 380 01/07/2019   VITAMINB12 881 01/05/2018   VITAMINB12 >1500 (H) 12/26/2016   VITAMINB12 573 02/26/2016   VITAMINB12 254 12/27/2015   She was on 2000 mcg B12 every other day, then B complex daily at last OV >> now sublingual B12 1000 mcg daily.  ROS: Constitutional: no weight gain/no weight loss, no fatigue, no subjective hyperthermia, no subjective hypothermia Eyes: no blurry vision, no xerophthalmia ENT: no sore throat, + see HPI Cardiovascular: no CP/no SOB/+ palpitations/no leg swelling Respiratory: no cough/no SOB/no wheezing Gastrointestinal: no N/no V/no  D/no C/no acid reflux, + AP-resolved Musculoskeletal: no muscle aches/no joint aches Skin: no rashes, no hair loss Neurological: no tremors/no numbness/no tingling/no dizziness  I reviewed pt's medications, allergies, PMH, social hx, family hx, and changes were documented in the history of present illness. Otherwise, unchanged from my initial visit note.  Past Medical History:  Diagnosis Date  . Atrial fibrillation ([place]) 2000   from stress/increased caffeine use  . Cataract   . 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 History of chicken pox    Past Surgical History:  Procedure Laterality Date  . CATARACT EXTRACTION    . TONSILLECTOMY AND ADENOIDECTOMY  1962   Social History   Socioeconomic History  . Marital status: Divorced    Spouse name: Not on file  . Number of children: Not on file  . Years of education: 12+  . Highest education level: Not on file  Occupational History  . Occupation: Programmer, multimedia: [employer]   Tobacco Use  . Smoking status: Former Research scientist (life sciences)  . Smokeless tobacco: Never Used  Substance and Sexual Activity  . Alcohol use: No  . Drug use: No  . Sexual activity: Never  Other Topics Concern  . Not on file  Social History Narrative   Regular exercise-no   Caffeine Use-yes   Social Determinants of Health   Financial Resource Strain:   . Difficulty of Paying Living Expenses: Not on file  Food Insecurity:   . Worried About Charity fundraiser in the Last Year: Not on file  . Ran Out of Food in the Last Year: Not on file  Transportation Needs:   . Lack of Transportation (Medical): Not on file  . Lack of Transportation (Non-Medical): Not on file  Physical Activity:   . Days of Exercise per Week: Not on file  . Minutes of Exercise per Session: Not on file  Stress:   . Feeling of Stress : Not on file  Social Connections:   . Frequency of Communication with Friends and Family: Not on file  . Frequency of Social Gatherings with Friends and  Family: Not on file  . Attends Religious Services: Not on file  . Active Member of Clubs or Organizations: Not on file  . Attends Archivist Meetings: Not on file  . Marital Status: Not on file  Intimate Partner Violence:   . Fear of Current or Ex-Partner: Not on file  . Emotionally Abused: Not on file  . Physically Abused: Not on file  . Sexually Abused: Not on file   Current Outpatient Medications on File Prior to Visit  Medication Sig Dispense Refill  . cabergoline (DOSTINEX) 0.5 MG tablet Take 0.5 tablets (0.25 mg total) by mouth 2 (two) times a week. 24 tablet 2  . cholecalciferol (VITAMIN D3) 25 MCG (1000 UT) tablet Take 2,000 Units by mouth daily.     No  current facility-administered medications on file prior to visit.   Allergies  Allergen Reactions  . Erythromycin Shortness Of Breath  . Quinolones Other (See Comments)    Insomnia    Family History  Problem Relation Age of Onset  . Cancer Mother        breast, colon  . Colon cancer Mother 31  . Stroke Mother 2       10/18/2017  . Diabetes Maternal Grandmother   . Diabetes Brother        Type 2 Diabetes   PE: BP 118/70   Pulse 74   Ht 5' 7.72" (1.72 m)   Wt 203 lb (92.1 kg)   SpO2 96%   BMI 31.12 kg/m  Wt Readings from Last 3 Encounters:  01/06/20 203 lb (92.1 kg)  11/22/19 200 lb (90.7 kg)  07/21/19 206 lb (93.4 kg)   Constitutional: overweight, in NAD Eyes: PERRLA, EOMI, no exophthalmos ENT: moist mucous membranes, no thyromegaly, but palpable thyroid nodule in left lobe no cervical lymphadenopathy Cardiovascular: RRR, No RG, +1/6 SEM Respiratory: CTA B Gastrointestinal: abdomen soft, NT, ND, BS+ Musculoskeletal: no deformities, strength intact in all 4 Skin: moist, warm, no rashes Neurological: no tremor with outstretched hands, DTR normal in all 4  ASSESSMENT: 1. MNG - thyroid U/S 02/04/2013:  Right thyroid lobe - 4.2 x 1.5 x 1.1 cm. Only small right thyroid nodules are present of no  more than 5 mm in diameter.   Left thyroid lobe - 4.9 x 2.1 x 1.9 cm. Multiple left thyroid nodules are present. The largest is complex but primarily cystic in the upper pole of the left lobe measuring 1.7 x 1.0 x 1.3 cm. Peripheral calcifications are noted within this cystic lesion. (these do appear to be not calcifications but inspissated colloid due to presence of comet tail artifact. Nodules in the lower pole of the left lobe measure 1.2 x 0.7 x 1.1 cm, and 1.0 x 0.8 x 1.8 cm. One of them is a cyst.  Isthmus  -  Thickness: 3 mm in thickness. No nodules visualized.   Lymphadenopathy  - None visualized.  2. History of microprolactinoma - dx 1992 - previously on cabergoline until 2000, when she took herself off it due to palpitations.  We restarted these afterwards and she is currently on 25 mcg twice a week - prolactin check in ~2004 was mildly elevated - has 3 children - she is now postmenopausal - no osteoporosis - MRI (03/09/2014): 1. Findings suspicious for a 6 mm focus of hypo enhancing soft  tissue arising somewhat exophyticly from the caudal aspect of the  gland, suspicious for a microadenoma in this setting. 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  2. Otherwise normal pituitary imaging.  3. Otherwise normal for age MRI appearance of the brain.   3. Vit D insuff  4. Low Vit B12    PLAN: 1. MNG  -Reviewed the report of her latest thyroid ultrasound: She has a left-sided thyroid cyst with coagulated colloid -No neck compression symptoms -No repeat thyroid ultrasound needed for now -Latest TFTs were normal at last visit -We will repeat TSH today -I will see her back in a year  2. Microprolactinoma -She has a history of a 6 mm macroprolactinoma with normal pituitary hormones.  There is no need to repeat the MRI unless the prolactin is not improving on treatment -At last 2 visits visit, prolactin level was slightly high  but we  did not change her regimen -In the past, she could not tolerate the twice a week Cabergoline regimen because she had dizziness and palpitations.  However, since then, she was able to increase it back to twice a week without side effects.   -We discussed that since she is postmenopausal, it is unlikely that the slightly high level is clinically important especially since she has no breast tenderness, galactorrhea, or headaches. -We will recheck a prolactin level today  3. Vit D insuff -Continues on 2000 units daily -Level was normal at last visit -Recheck level today  4. Low Vit B12  -was on vitamin B12 2000 mcg every other day >> now on B complex -She feels much better after she started a supplement -Latest level was normal at the previous visit -Recheck level today  Component     Latest Ref Rng & Units 01/07/2019  Prolactin     ng/mL 33.2 (H)  TSH     0.35 - 4.50 uIU/mL 0.92  Vitamin B12     211 - 911 pg/mL 380  VITD     30.00 - 100.00 ng/mL 27.35 (L)   Prolactin is only very slightly high, but a little improved from previous level.  We will continue the current dose of cabergoline TSH is normal.   B12 is also normal.   Vitamin D is slightly low.  If missing doses, I would suggest to take this every day.  If not, we may need to increase the dose to 4000 units daily.  Philemon Kingdom, MD PhD  Endocrinology  Crystal Bauer ID: Crystal Bauer, female   DOB: 12-26-1955, 64 y.o.   MRN: [id number]  This visit occurred during the SARS-CoV-2 public health emergency.  Safety protocols were in place, including screening questions prior to the visit, additional usage of staff PPE, and extensive cleaning of exam room while observing appropriate contact time as indicated for disinfecting solutions.   HPI  Crystal Bauer is a 64 y.o.-year-old female, returning for f/u for MNG, microprolactinoma, vitamin B12, and D insufficiencies. Last visit 1 year ago.  Multinodular  goiter: Reviewed history: Crystal Bauer complained of an enlarging left lower neck mass which was palpated at a visit with her PCP.  Thyroid U/S on 02/04/2013 showed bilateral thyroid nodules, small, less than 5 mm in the right lobe, and 3 larger nodules in the left lobe. The largest lesion is a 1.7 x 1.0 x 1.3 cm cyst situated in the left upper lobe, with post-acoustic shadowing, and colloid particles on the periphery.  Reviewed her TFTs: Lab Results  Component Value Date   TSH 0.92 01/07/2019   TSH 1.42 01/05/2018   TSH 1.530 11/17/2017   TSH 0.92 12/26/2016   TSH 0.78 12/23/2015   TSH 0.63 03/06/2015   TSH 1.10 12/02/2014   TSH 0.39 03/04/2014   TSH 0.63 02/26/2013   TSH 0.86 10/01/2012   FREET4 0.88 01/05/2018   FREET4 0.78 03/06/2015   FREET4 0.87 03/04/2014   FREET4 0.85 02/26/2013    Pt denies: - feeling nodules in neck - hoarseness - dysphagia - choking - SOB with lying down  Micro-prolactinoma: - dx i in 1992 She was initially on Cabergoline >> stopped in 2000 as her microprolactinoma shrank. PRL in ~2004 minimally elevated (Off Cabergoline).  We tried to stay off Cabergoline in 2016, but she started to have L breast milky discharge + breast pain (lump). We checked PRL  >> increased to 122 >> started back on Cabergoline 0.25 mg bid >>  levels improved >> decreased to once weekly in 06/2015 due to dizziness and palpitations.  However, afterwards, we tried again to increase it to twice a week and she did well on this dose, until recently when she again started to have palpitations and had an episode of abdominal pain.  She decreased the dose of Cabergoline to 0.25 mg weekly since then.  She denies headaches, visual abnormalities, galactorrhea, weight gain.  Reviewed prolactin levels: Lab Results  Component Value Date   PROLACTIN 33.2 (H) 01/07/2019   PROLACTIN 35.7 (H) 01/05/2018   PROLACTIN 38.5 (H) 12/26/2016   PROLACTIN 24.1 12/27/2015   PROLACTIN 13.4 07/07/2015    PROLACTIN 57.1 (H) 03/06/2015   PROLACTIN 122.2 12/02/2014   PROLACTIN 99.8 03/04/2014   PROLACTIN 76.0 02/26/2013   Other pituitary labs were normal: Somatomedin (IGF-I)     31 - 179 ng/mL 119  C206 ACTH     10 - 46 pg/mL 21  Cortisol, Plasma      12.6  FSH      83.9  LH      35.53   MRI (03/09/2014): Pituitary microadenoma measuring 6 mm.  Reviewed vitamin D levels: Lab Results  Component Value Date   VD25OH 27.35 (L) 01/07/2019   VD25OH 31.67 01/05/2018   VD25OH 36.12 12/26/2016   VD25OH 39.05 02/26/2016   VD25OH 27.08 (L) 12/27/2015   She was on 2000 units vitamin D daily, which we increased to 4000 units daily at last visit.  Reviewed vitamin B12 levels: Lab Results  Component Value Date   [id number] 253 01/07/2019   VITAMINB12 881 01/05/2018   VITAMINB12 >1500 (H) 12/26/2016   VITAMINB12 573 02/26/2016   VITAMINB12 254 12/27/2015   She was on 2000 mcg B12 every other day, currently on B complex daily.  ROS: Constitutional: no weight gain/no weight loss, no fatigue, no subjective hyperthermia, no subjective hypothermia Eyes: no blurry vision, no xerophthalmia ENT: no sore throat, + see HPI Cardiovascular: no CP/no SOB/no palpitations/no leg swelling Respiratory: no cough/no SOB/no wheezing Gastrointestinal: no N/no V/no D/no C/no acid reflux Musculoskeletal: no muscle aches/no joint aches Skin: no rashes, no hair loss Neurological: no tremors/no numbness/no tingling/no dizziness  I reviewed pt's medications, allergies, PMH, social hx, family hx, and changes were documented in the history of present illness. Otherwise, unchanged from my initial visit note.  Past Medical History:  Diagnosis Date  . Atrial fibrillation ([place]) 2000   from stress/increased caffeine use  . Cataract   . 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 History of chicken pox    Past Surgical History:  Procedure Laterality Date  . CATARACT EXTRACTION    . TONSILLECTOMY AND ADENOIDECTOMY   1962   Social History   Socioeconomic History  . Marital status: Divorced    Spouse name: Not on file  . Number of children: Not on file  . Years of education: 12+  . Highest education level: Not on file  Occupational History  . Occupation: Programmer, multimedia: [employer]   Tobacco Use  . Smoking status: Former Research scientist (life sciences)  . Smokeless tobacco: Never Used  Substance and Sexual Activity  . Alcohol use: No  . Drug use: No  . Sexual activity: Never  Other Topics Concern  . Not on file  Social History Narrative   Regular exercise-no   Caffeine Use-yes   Social Determinants of Health   Financial Resource Strain:   . Difficulty of Paying Living Expenses: Not on  file  Food Insecurity:   . Worried About Charity fundraiser in the Last Year: Not on file  . Ran Out of Food in the Last Year: Not on file  Transportation Needs:   . Lack of Transportation (Medical): Not on file  . Lack of Transportation (Non-Medical): Not on file  Physical Activity:   . Days of Exercise per Week: Not on file  . Minutes of Exercise per Session: Not on file  Stress:   . Feeling of Stress : Not on file  Social Connections:   . Frequency of Communication with Friends and Family: Not on file  . Frequency of Social Gatherings with Friends and Family: Not on file  . Attends Religious Services: Not on file  . Active Member of Clubs or Organizations: Not on file  . Attends Archivist Meetings: Not on file  . Marital Status: Not on file  Intimate Partner Violence:   . Fear of Current or Ex-Partner: Not on file  . Emotionally Abused: Not on file  . Physically Abused: Not on file  . Sexually Abused: Not on file   Current Outpatient Medications on File Prior to Visit  Medication Sig Dispense Refill  . cabergoline (DOSTINEX) 0.5 MG tablet Take 0.5 tablets (0.25 mg total) by mouth 2 (two) times a week. 24 tablet 2  . cholecalciferol (VITAMIN D3) 25 MCG (1000 UT) tablet Take 2,000 Units  by mouth daily.     No current facility-administered medications on file prior to visit.   Allergies  Allergen Reactions  . Erythromycin Shortness Of Breath  . Quinolones Other (See Comments)    Insomnia    Family History  Problem Relation Age of Onset  . Cancer Mother        breast, colon  . Colon cancer Mother 76  . Stroke Mother 30       10/18/2017  . Diabetes Maternal Grandmother   . Diabetes Brother        Type 2 Diabetes   PE: BP 118/70   Pulse 74   Ht 5' 7.72" (1.72 m)   Wt 203 lb (92.1 kg)   SpO2 96%   BMI 31.12 kg/m  Wt Readings from Last 3 Encounters:  01/06/20 203 lb (92.1 kg)  11/22/19 200 lb (90.7 kg)  07/21/19 206 lb (93.4 kg)   Constitutional: overweight, in NAD Eyes: PERRLA, EOMI, no exophthalmos ENT: moist mucous membranes, no thyromegaly but palpable thyroid nodule in left lobe, no cervical lymphadenopathy Cardiovascular: RRR, No RG, +1/6 SEM Respiratory: CTA B Gastrointestinal: abdomen soft, NT, ND, BS+ Musculoskeletal: no deformities, strength intact in all 4 Skin: moist, warm, no rashes Neurological: no tremor with outstretched hands, DTR normal in all 4  ASSESSMENT: 1. MNG - thyroid U/S 02/04/2013:  Right thyroid lobe - 4.2 x 1.5 x 1.1 cm. Only small right thyroid nodules are present of no more than 5 mm in diameter.   Left thyroid lobe - 4.9 x 2.1 x 1.9 cm. Multiple left thyroid nodules are present. The largest is complex but primarily cystic in the upper pole of the left lobe measuring 1.7 x 1.0 x 1.3 cm. Peripheral calcifications are noted within this cystic lesion. (these do appear to be not calcifications but inspissated colloid due to presence of comet tail artifact). Nodules in the lower pole of the left lobe measure 1.2 x 0.7 x 1.1 cm, and 1.0 x 0.8 x 1.8 cm. One of them is a cyst.  Isthmus  -  Thickness: 3 mm in thickness. No nodules visualized.   Lymphadenopathy  - None visualized.  2. History of microprolactinoma - dx 1992 -  previously on cabergoline until 2000, when she took herself off it due to palpitations.  We restarted these afterwards and she is currently on 25 mcg twice a week - prolactin check in ~2004 was mildly elevated - has 3 children - she is now postmenopausal - no osteoporosis - MRI (03/09/2014): 1. Findings suspicious for a 6 mm focus of hypo enhancing soft  tissue arising somewhat exophyticly from the caudal aspect of the  gland, suspicious for a microadenoma in this setting. 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  2. Otherwise normal pituitary imaging.  3. Otherwise normal for age MRI appearance of the brain.   3. Vit D insuff  4. Low Vit B12   PLAN: 1. MNG  -Reviewed the results of her latest thyroid ultrasound: She has a left-sided thyroid cyst with coagulated colloid -She denies neck compression symptoms -TFTs were normal at last check and we will repeat a TSH today -No further follow-up is needed for this  2. Microprolactinoma -She has a history of a 6 mm macroprolactinoma with normal pituitary hormones.  There is no need to repeat the MRI unless the prolactin starts rising. -At last visit, prolactin was still slightly above normal, but improved from before -In the past, she could not tolerate twice a week Cabergoline because of dizziness and palpitations, however, she then tolerated 0.25 mg dose BIW well until recently when she had palpitations and an episode of abdominal pain.  She did decrease the dose to 0.25 mg weekly approximately 1.5 months ago.  She would be interested to stop it completely.  We discussed about checking prolactin level today and then see if we can stop the medication or at least take it every other week. -Since she is postmenopausal, we do not absolutely need to push the prolactin level to normal range unless she starts developing breast tenderness, diarrhea, or headaches.   -We we will recheck the  prolactin level today  3. Vit D insuff -She was on 2000 units daily at last visit -Latest level was reviewed from 12/2018 and this was slightly low at 27.35. -We increase her vitamin D dose to 4000 units daily after the above results returned.  For the last 3 to 4 months she has been taking this more consistently. -We will recheck her level today  4. Low Vit B12  -She was previously on 2000 mcg every other day but then on B complex switched before last visit.  As of now, she is on sublingual B12 1000 mcg daily. -She feels much better after she started supplementation -Latest level was reviewed from 12/2018 and this was normal, 380 -We will recheck her level today  Component     Latest Ref Rng & Units 01/07/2020  Prolactin     4.8 - 23.3 ng/mL 55.3 (H)  TSH     0.450 - 4.500 uIU/mL 1.090  Vitamin B12     232 - 1,245 pg/mL 645  Vitamin D, 25-Hydroxy     30.0 - 100.0 ng/mL 28.4 (L)  Prolactin level is higher but we can try to stop cabergoline per her preference and repeat the prolactin in 1.5-2 months. Vitamin D level is still low >> will increase the dose to 5000 units daily.  We can recheck this is for the prolactin check. B12 level is normal.  Philemon Kingdom, MD PhD [facility] Endocrin[facility]

## 2020-01-07 ENCOUNTER — Other Ambulatory Visit (INDEPENDENT_AMBULATORY_CARE_PROVIDER_SITE_OTHER): Payer: 59

## 2020-01-07 ENCOUNTER — Other Ambulatory Visit: Payer: Self-pay

## 2020-01-07 DIAGNOSIS — E042 Nontoxic multinodular goiter: Secondary | ICD-10-CM

## 2020-01-07 DIAGNOSIS — E559 Vitamin D deficiency, unspecified: Secondary | ICD-10-CM

## 2020-01-07 DIAGNOSIS — D352 Benign neoplasm of pituitary gland: Secondary | ICD-10-CM

## 2020-01-07 DIAGNOSIS — E538 Deficiency of other specified B group vitamins: Secondary | ICD-10-CM

## 2020-01-08 LAB — TSH: TSH: 1.09 u[IU]/mL (ref 0.450–4.500)

## 2020-01-08 LAB — VITAMIN D 25 HYDROXY (VIT D DEFICIENCY, FRACTURES): Vit D, 25-Hydroxy: 28.4 ng/mL — ABNORMAL LOW (ref 30.0–100.0)

## 2020-01-08 LAB — PROLACTIN: Prolactin: 55.3 ng/mL — ABNORMAL HIGH (ref 4.8–23.3)

## 2020-01-08 LAB — VITAMIN B12: Vitamin B-12: 645 pg/mL (ref 232–1245)

## 2021-01-11 ENCOUNTER — Other Ambulatory Visit: Payer: Self-pay

## 2021-01-11 ENCOUNTER — Ambulatory Visit (INDEPENDENT_AMBULATORY_CARE_PROVIDER_SITE_OTHER): Payer: 59 | Admitting: Internal Medicine

## 2021-01-11 ENCOUNTER — Encounter: Payer: Self-pay | Admitting: Internal Medicine

## 2021-01-11 VITALS — BP 106/60 | HR 70 | Ht 67.75 in | Wt 196.0 lb

## 2021-01-11 DIAGNOSIS — E042 Nontoxic multinodular goiter: Secondary | ICD-10-CM | POA: Diagnosis not present

## 2021-01-11 DIAGNOSIS — D352 Benign neoplasm of pituitary gland: Secondary | ICD-10-CM | POA: Diagnosis not present

## 2021-01-11 DIAGNOSIS — E559 Vitamin D deficiency, unspecified: Secondary | ICD-10-CM

## 2021-01-11 DIAGNOSIS — E538 Deficiency of other specified B group vitamins: Secondary | ICD-10-CM

## 2021-01-11 NOTE — Patient Instructions (Signed)
Please stop at the lab.  Please continue off Cabergoline.  Continue vitamin D 5000 IU daily and vitamin B12 1000 mcg daily.  Please return in 1 year.

## 2021-01-11 NOTE — Progress Notes (Signed)
Patient ID: Crystal Bauer, female   DOB: Sep 24, 1955, 65 y.o.   MRN: [id number]  This visit occurred during the SARS-CoV-2 public health emergency.  Safety protocols were in place, including screening questions prior to the visit, additional usage of staff PPE, and extensive cleaning of exam room while observing appropriate contact time as indicated for disinfecting solutions.   HPI  Crystal Bauer is a 65 y.o.-year-old female, returning for f/u for MNG, microprolactinoma, vitamin B12, and D insufficiencies. Last visit 1 year ago.  Interim history: She stopped Cabergoline after last OV >> feels better: weight loss, no more dizziness, no breast discharge or fulness, no HAs, no vision pbs.  Multinodular goiter: Reviewed history: Patient complained of an enlarging left lower neck mass which was palpated at a visit with her PCP.  Thyroid U/S on 02/04/2013 showed bilateral thyroid nodules, small, less than 5 mm in the right lobe, and 3 larger nodules in the left lobe. The largest lesion is a 1.7 x 1.0 x 1.3 cm cyst situated in the left upper lobe, with post-acoustic shadowing, and colloid particles on the periphery.  Reviewed her TFTs: Lab Results  Component Value Date   TSH 1.090 01/07/2020   TSH 0.92 01/07/2019   TSH 1.42 01/05/2018   TSH 1.530 11/17/2017   TSH 0.92 12/26/2016   TSH 0.78 12/23/2015   TSH 0.63 03/06/2015   TSH 1.10 12/02/2014   TSH 0.39 03/04/2014   TSH 0.63 02/26/2013   FREET4 0.88 01/05/2018   FREET4 0.78 03/06/2015   FREET4 0.87 03/04/2014   FREET4 0.85 02/26/2013    Pt denies: - feeling nodules in neck - hoarseness - dysphagia - choking - SOB with lying down  Micro-prolactinoma: - dx in 1992 - She was initially on Cabergoline >> stopped in 2000 as her microprolactinoma shrank. - PRL in ~2004 minimally elevated (Off Cabergoline). - We tried to stay off Cabergoline in 2016, but she started to have L breast milky discharge + breast pain (lump). We  checked PRL  >> increased to 122 >> started back on Cabergoline 0.25 mg bid >> levels improved >> decreased to once weekly in 06/2015 due to dizziness and palpitations.   - However, afterwards, we tried again to increase it to twice a week and she did well on this dose, until 2021 when she again started to have palpitations and had an episode of abdominal pain.  She decreased the dose of Cabergoline to 0.25 mg weekly s in 10/2019. - At last visit, we stopped Cabergoline completely and I advised him to come back for labs in 1.5 to 2 months but she did not return.  Reviewed prolactin levels: Lab Results  Component Value Date   PROLACTIN 55.3 (H) 01/07/2020   PROLACTIN 33.2 (H) 01/07/2019   PROLACTIN 35.7 (H) 01/05/2018   PROLACTIN 38.5 (H) 12/26/2016   PROLACTIN 24.1 12/27/2015   PROLACTIN 13.4 07/07/2015   PROLACTIN 57.1 (H) 03/06/2015   PROLACTIN 122.2 12/02/2014   PROLACTIN 99.8 03/04/2014   PROLACTIN 76.0 02/26/2013   Other pituitary labs were normal: Somatomedin (IGF-I)     31 - 179 ng/mL 119  C206 ACTH     10 - 46 pg/mL 21  Cortisol, Plasma      12.6  FSH      83.9  LH      35.53   MRI (03/09/2014): Pituitary microadenoma measuring 6 mm.  Reviewed vitamin D levels: Lab Results  Component Value Date   VD25OH 28.4 (L) 01/07/2020  VD25OH 27.35 (L) 01/07/2019   VD25OH 31.67 01/05/2018   VD25OH 36.12 12/26/2016   VD25OH 39.05 02/26/2016   VD25OH 27.08 (L) 12/27/2015   At last visit we increased the dose to 5000 units daily  Reviewed vitamin B12 levels: Lab Results  Component Value Date   VITAMINB12 645 01/07/2020   VITAMINB12 380 01/07/2019   VITAMINB12 881 01/05/2018   VITAMINB12 >1500 (H) 12/26/2016   VITAMINB12 573 02/26/2016   VITAMINB12 254 12/27/2015   On 1000 mcg sl  B12 daily.  ROS: Constitutional: no weight gain/no weight loss, no fatigue, no subjective hyperthermia, no subjective hypothermia Eyes: no blurry vision, no xerophthalmia ENT: no sore  throat, + see HPI Cardiovascular: no CP/no SOB/no palpitations/no leg swelling Respiratory: no cough/no SOB/no wheezing Gastrointestinal: no N/no V/no D/no C/no acid reflux Musculoskeletal: no muscle aches/no joint aches Skin: no rashes, no hair loss Neurological: no tremors/no numbness/no tingling/no dizziness  I reviewed pt's medications, allergies, PMH, social hx, family hx, and changes were documented in the history of present illness. Otherwise, unchanged from my initial visit note.  Past Medical History:  Diagnosis Date   Atrial fibrillation ([place]) 2000   from stress/increased caffeine use   Cataract    Hearing loss 2012   left ear   History of chicken pox    Past Surgical History:  Procedure Laterality Date   CATARACT EXTRACTION     TONSILLECTOMY AND ADENOIDECTOMY  1962   Social History   Socioeconomic History   Marital status: Divorced    Spouse name: Not on file   Number of children: Not on file   Years of education: 12+   Highest education level: Not on file  Occupational History   Occupation: Programmer, multimedia: [employer]   Tobacco Use   Smoking status: Former   Smokeless tobacco: Never  Substance and Sexual Activity   Alcohol use: No   Drug use: No   Sexual activity: Never  Other Topics Concern   Not on file  Social History Narrative   Regular exercise-no   Caffeine Use-yes   Social Determinants of Health   Financial Resource Strain: Not on file  Food Insecurity: Not on file  Transportation Needs: Not on file  Physical Activity: Not on file  Stress: Not on file  Social Connections: Not on file  Intimate Partner Violence: Not on file   Current Outpatient Medications on File Prior to Visit  Medication Sig Dispense Refill   cabergoline (DOSTINEX) 0.5 MG tablet Take 0.5 tablets (0.25 mg total) by mouth 2 (two) times a week. 24 tablet 2   cholecalciferol (VITAMIN D3) 25 MCG (1000 UT) tablet Take 2,000 Units by mouth daily.     No  current facility-administered medications on file prior to visit.   Allergies  Allergen Reactions   Erythromycin Shortness Of Breath   Quinolones Other (See Comments)    Insomnia    Family History  Problem Relation Age of Onset   Cancer Mother        breast, colon   Colon cancer Mother 34   Stroke Mother 18       10/18/2017   Diabetes Maternal Grandmother    Diabetes Brother        Type 2 Diabetes   PE: BP 106/60 (BP Location: Left Arm, Patient Position: Sitting, Cuff Size: Normal)   Pulse 70   Ht 5' 7.75" (1.721 m)   Wt 196 lb (88.9 kg)   SpO2 95%  BMI 30.02 kg/m  Wt Readings from Last 3 Encounters:  01/11/21 196 lb (88.9 kg)  01/06/20 203 lb (92.1 kg)  11/22/19 200 lb (90.7 kg)   Constitutional: overweight, in NAD Eyes: PERRLA, EOMI, no exophthalmos ENT: moist mucous membranes, no thyromegaly but palpable thyroid nodule in left lobe, no cervical lymphadenopathy Cardiovascular: RRR, No RG, +1/6 SEM Respiratory: CTA B Gastrointestinal: abdomen soft, NT, ND, BS+ Musculoskeletal: no deformities, strength intact in all 4 Skin: moist, warm, no rashes Neurological: no tremor with outstretched hands, DTR normal in all 4  ASSESSMENT: 1. MNG - thyroid U/S 02/04/2013: Right thyroid lobe - 4.2 x 1.5 x 1.1 cm. Only small right thyroid nodules are present of no more than 5 mm in diameter.  Left thyroid lobe - 4.9 x 2.1 x 1.9 cm. Multiple left thyroid nodules are present. The largest is complex but primarily cystic in the upper pole of the left lobe measuring 1.7 x 1.0 x 1.3 cm. Peripheral calcifications are noted within this cystic lesion. (these do appear to be not calcifications but inspissated colloid due to presence of comet tail artifact). Nodules in the lower pole of the left lobe measure 1.2 x 0.7 x 1.1 cm, and 1.0 x 0.8 x 1.8 cm. One of them is a cyst. Isthmus  -  Thickness: 3 mm in thickness. No nodules visualized.  Lymphadenopathy  - None visualized.  2. History of  microprolactinoma - dx 1992 - previously on cabergoline until 2000, when she took herself off it due to palpitations.  We restarted these afterwards and she is currently on 25 mcg twice a week - prolactin check in ~2004 was mildly elevated - has 3 children - she is now postmenopausal - no osteoporosis - MRI (03/09/2014): 1. Findings suspicious for a 6 mm focus of hypo enhancing soft  tissue arising somewhat exophyticly from the caudal aspect of the  gland, suspicious for a microadenoma in this setting. 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  2. Otherwise normal pituitary imaging.  3. Otherwise normal for age MRI appearance of the brain.   3. Vit D insuff  4. Low Vit B12   PLAN: 1. MNG  -Reviewed the results of her latest thyroid ultrasound: She has a left-sided thyroid cyst with coagulated colloid -No neck compression symptoms -TFTs were normal in the past, including at last visit -No further follow-up needed at this  2. Microprolactinoma -She has a history of a 6 mm micro prolactinoma with normal pituitary hormones.  There is no need to repeat the MRI unless the prolactin starts rising. -In the past, she could not tolerate twice a week Cabergoline because of dizziness and palpitations, however, afterwards, she started to do well with 0.25 mg dose twice a week until right before last visit when she had palpitations and an episode of abdominal pain.  She decreased the dose by herself 0.25 mg weekly approximately 1.5 months prior to the appointment and she was interested in stopping it completely. -At last visit, prolactin was slightly higher, at 55.3 -We discussed at that time that she had since she was postmenopausal, we did not absolutely need to present prolactin level to normal range unless she started to develop breast tenderness, or headaches.  However, I also advised her to return for labs in 1.5 to 2 months but she did not do  so.  At this visit, she tells me that she is actually feeling better after stopping Cabergoline, without dizziness  and also was able to lose some weight without much effort (only changing the ingredients in her smoothie in the morning) -We will recheck the prolactin level today  3. Vit D insuff -Denies muscle and joint pains -Previously on 2000 units daily and then increase to 4000 units daily.   However, at last visit, vitamin D was still low, at 28.4, so I advised her to increase to 5000 units daily -We will recheck her level today  4. Low Vit B12  -Denies fatigue, numbness -She was previously on 2000 mcg B12 every other day but then on B complex.  At last visit,  she was back on B12 1000 mcg sublingual. -She feels much better on the B12 supplement -Latest B12 level was at goal, at 645 a year ago -We will recheck her level today  Component     Latest Ref Rng & Units 01/11/2021  Vitamin B12     211 - 911 pg/mL 831  Prolactin     ng/mL 112.3 (H)  Vitamin D, 25-Hydroxy     30.0 - 100.0 ng/mL 31.9  Vitamin B-12 and D are normal. However, prolactin level is quite high.  I would suggest to start Cabergoline 0.25 mg weekly.  Plan to repeat prolactin level in 1-2 months.  Philemon Kingdom, MD PhD [facility] Endocrinology

## 2021-01-12 LAB — VITAMIN B12: Vitamin B-12: 831 pg/mL (ref 211–911)

## 2021-01-12 LAB — VITAMIN D 25 HYDROXY (VIT D DEFICIENCY, FRACTURES): Vit D, 25-Hydroxy: 31.9 ng/mL (ref 30.0–100.0)

## 2021-01-12 LAB — PROLACTIN: Prolactin: 112.3 ng/mL — ABNORMAL HIGH

## 2021-01-15 MED ORDER — CABERGOLINE 0.5 MG PO TABS: 0.2500 mg | ORAL_TABLET | ORAL | 3 refills | Status: DC

## 2021-01-19 ENCOUNTER — Encounter: Payer: Self-pay | Admitting: Internal Medicine

## 2021-04-23 ENCOUNTER — Other Ambulatory Visit: Payer: Self-pay | Admitting: Physician Assistant

## 2021-04-23 ENCOUNTER — Other Ambulatory Visit: Payer: Self-pay | Admitting: Family Medicine

## 2021-12-11 ENCOUNTER — Encounter: Payer: Self-pay | Admitting: Family Medicine

## 2021-12-11 ENCOUNTER — Ambulatory Visit (INDEPENDENT_AMBULATORY_CARE_PROVIDER_SITE_OTHER): Payer: Medicare HMO | Admitting: Family Medicine

## 2021-12-11 VITALS — BP 110/70 | HR 71 | Temp 98.3°F | Ht 68.0 in | Wt 194.4 lb

## 2021-12-11 DIAGNOSIS — K219 Gastro-esophageal reflux disease without esophagitis: Secondary | ICD-10-CM | POA: Diagnosis not present

## 2021-12-11 DIAGNOSIS — D352 Benign neoplasm of pituitary gland: Secondary | ICD-10-CM | POA: Diagnosis not present

## 2021-12-11 DIAGNOSIS — E7800 Pure hypercholesterolemia, unspecified: Secondary | ICD-10-CM | POA: Diagnosis not present

## 2021-12-11 DIAGNOSIS — R1310 Dysphagia, unspecified: Secondary | ICD-10-CM | POA: Diagnosis not present

## 2021-12-11 NOTE — Patient Instructions (Signed)
Welcome to [name]! It was a pleasure meeting you today.  As discussed, Please schedule a 4 month follow up visit today.  Try red yeast rice for cholesterol.  Consider cholestoff.  Remember the pepcid twice/day.   PLEASE NOTE:  If you had any LAB tests please let us know if you have not heard back within a few days. You may see your results on MyChart before we have a chance to review them but we will give you a call once they are reviewed by [place]. If we ordered any REFERRALS today, please let us know if you have not heard from their office within the next week.  Let us know through MyChart if you are needing REFILLS, or have your pharmacy send [place] the request. You can also use MyChart to communicate with me or any office staff.  Please try these tips to maintain a healthy lifestyle:  Eat most of your calories during the day when you are active. Eliminate processed foods including packaged sweets (pies, cakes, cookies), reduce intake of potatoes, white bread, white pasta, and white rice. Look for whole grain options, oat flour or almond flour.  Each meal should contain half fruits/vegetables, one quarter protein, and one quarter carbs (no bigger than a computer mouse).  Cut down on sweet beverages. This includes juice, soda, and sweet tea. Also watch fruit intake, though this is a healthier sweet option, it still contains natural sugar! Limit to 3 servings daily.  Drink at least 1 glass of water with each meal and aim for at least 8 glasses per day  Exercise at least 150 minutes every week.

## 2021-12-11 NOTE — Progress Notes (Signed)
New Patient Office Visit  Subjective:  Patient ID: Crystal Bauer, female    DOB: November 16, 1955  Age: 66 y.o. MRN: [id number]  CC:  Chief Complaint  Patient presents with   Establish Care    Need new pcp Stomach burning     HPI Crystal Bauer presents for new pt-wants physician  Pit microadenoma-when on meds, bp too low so off.  Asymptomatic.  Seeing endo. Heartburn-woke her up 2 yrs ago and referred to Card-suggested pravastatin-got constipated/depressed/no energy. Chol elevated for >65yr.  Pt working on diet.  Some exercise.+FH CAD(dad dec 65).  Mom CHF.  Takes pepcid once-twice/day.  Can wake her up.  Took prevacid in past for 1 mo.   Cscope 2014-normal.  Mom colon ca-80's   occ dysphagia.   Can get pain high LUQ.  No dark stools.      Dr. PHilarie Fredricksondid scope in 2014.   Past Medical History:  Diagnosis Date   [place]   from stress/increased caffeine use   Cataract    GERD (gastroesophageal reflux disease)    Hearing loss 05/20/2010   left ear   History of chicken pox    Pituitary adenoma ([place]    micro    Past Surgical History:  Procedure Laterality Date   CATARACT EXTRACTION     TONSILLECTOMY AND ADENOIDECTOMY  1965    Family History  Problem Relation Age of Onset   Cancer Mother        breast, colon   Colon cancer Mother 849  Stroke Mother 826      10/18/2017   Hyperlipidemia Mother    Diabetes Maternal Grandmother    Hearing loss Maternal Grandmother    Diabetes Brother        Type 2 Diabetes   Early death Father    Arthritis Sister     Social History   Socioeconomic History   Marital status: Divorced    Spouse name: Not on file   Number of children: 3   Years of education: 12+   Highest education level: Not on file  Occupational History   Occupation: RProgrammer, 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 [employer]   Tobacco Use   Smoking status: Former    Types: E-cigarettes   Smokeless tobacco: Never  VScientific laboratory technicianUse:  Never used  Substance and Sexual Activity   Alcohol use: No   Drug use: No   Sexual activity: Not Currently    Birth control/protection: Abstinence  Other Topics Concern   Not on file  Social History Narrative   Regular exercise-noCaffeine Use-yes      6 grands   retired   SInvestment banker, operationalof HRadio broadcast assistantStrain: Not on file  Food Insecurity: Not on file  Transportation Needs: Not on file  Physical Activity: Not on file  Stress: Not on file  Social Connections: Not on file  Intimate Partner Violence: Not on file    ROS   Objective:   Today's Vitals: BP 110/70   Pulse 71   Temp 98.3 F (36.8 C) (Temporal)   Wt 194 lb 6 oz (88.2 kg)   SpO2 97%   BMI 29.77 kg/m   Physical Exam  Gen: WDWN NAD HEENT: NCAT, conjunctiva not injected, sclera nonicteric TM WNL B, OP moist, no exudates  NECK:  supple, no thyromegaly, no nodes, no carotid bruits CARDIAC: RRR, S1S2+, no murmur. DP 2+B LUNGS: CTAB. No wheezes ABDOMEN:  BS+, soft, sl tender mid epi and lower abd, No HSM, no masses EXT:  no edema MSK: no gross abnormalities.  NEURO: A&O x3.  CN II-XII intact.  PSYCH: normal mood. Good eye contact  Recent blood tests from last month, October 2022- Na 142, K 5.7 and 5.1, Creatinine 0.73, GFR 92, LFTs normal, LDL 158, HDL 52, TG 156, TC 238, HgbA1c 5.6%, glucose 92, TSH 1.79, WBC 7.9, Hgb 15.8, MCV 95, platelets 322   Reviewed card note  Assessment & Plan:   Problem List Items Addressed This Visit       Endocrine   Microprolactinoma ([place])   Other Visit Diagnoses     Gastroesophageal reflux disease without esophagitis    -  Primary   Relevant Medications   famotidine (PEPCID) 20 MG tablet   Calcium Carbonate Antacid (TUMS PO)   Other Relevant Orders   H. pylori breath test   Ambulatory referral to Gastroenterology   Pure hypercholesterolemia       Dysphagia, unspecified type       Relevant Orders   H. pylori breath test   Ambulatory  referral to Gastroenterology      GERD and some dysphagia-can wake her up at night-pt declines PPI and will continue BID pepcid as seems to help.  Check H pylori.  Refer GI HLD-chronic.  Pt prefers no meds esp since SE to pravachol.  Try RYR, work on TLC.  F/u 4-23mofor annual and reck.  Microproactinoma-intol meds.  Asymptomatic.  Managed by endo.   Outpatient Encounter Medications as of 12/11/2021  Medication Sig   Calcium Carbonate Antacid (TUMS PO) Take 1,000 mg by mouth as needed.   Cholecalciferol (D3 PO) Take 1,000 mg by mouth daily.   Cyanocobalamin (B-12) 1000 MCG CAPS Take 1,000 Units by mouth daily.   famotidine (PEPCID) 20 MG tablet Take 20 mg by mouth. 1-2 times daily   Magnesium 400 MG CAPS Take 400 mg by mouth daily.   [DISCONTINUED] cabergoline (DOSTINEX) 0.5 MG tablet Take 0.5 tablets (0.25 mg total) by mouth once a week.   [DISCONTINUED] Cholecalciferol 125 MCG (5000 UT) TABS Vitamin D3 125 mcg (5,000 unit) tablet  Take by oral route.   [DISCONTINUED] famotidine (PEPCID) 10 MG tablet Take 10 mg by mouth daily.   [DISCONTINUED] Magnesium 250 MG TABS Take 250 mg by mouth daily.   No facility-administered encounter medications on file as of 12/11/2021.    Follow-up: Return in about 4 months (around 04/13/2022) for annual, chol.   AWellington Hampshire MD

## 2021-12-17 LAB — H. PYLORI BREATH TEST

## 2021-12-19 ENCOUNTER — Telehealth: Payer: Self-pay | Admitting: Family Medicine

## 2021-12-19 NOTE — Telephone Encounter (Signed)
FYI--Patient called back after VM was left in regards to her H.Pylori breath test. I informed patient she will be called back by PCP's CMA as soon as possible.

## 2021-12-19 NOTE — Telephone Encounter (Signed)
Spoke with patient. Patient scheduled for 12/25/2021 to retake H. pylori breath test.

## 2021-12-25 ENCOUNTER — Other Ambulatory Visit (INDEPENDENT_AMBULATORY_CARE_PROVIDER_SITE_OTHER): Payer: Medicare HMO

## 2021-12-25 DIAGNOSIS — R1310 Dysphagia, unspecified: Secondary | ICD-10-CM | POA: Diagnosis not present

## 2021-12-25 DIAGNOSIS — K219 Gastro-esophageal reflux disease without esophagitis: Secondary | ICD-10-CM | POA: Diagnosis not present

## 2021-12-25 LAB — H. PYLORI ANTIBODY, IGG: H Pylori IgG: NEGATIVE

## 2021-12-28 ENCOUNTER — Encounter: Payer: Self-pay | Admitting: Internal Medicine

## 2022-01-11 ENCOUNTER — Encounter: Payer: Self-pay | Admitting: Internal Medicine

## 2022-01-11 ENCOUNTER — Ambulatory Visit (INDEPENDENT_AMBULATORY_CARE_PROVIDER_SITE_OTHER): Payer: Medicare HMO | Admitting: Internal Medicine

## 2022-01-11 VITALS — BP 128/72 | HR 66 | Ht 68.0 in | Wt 195.4 lb

## 2022-01-11 DIAGNOSIS — D352 Benign neoplasm of pituitary gland: Secondary | ICD-10-CM | POA: Diagnosis not present

## 2022-01-11 DIAGNOSIS — E559 Vitamin D deficiency, unspecified: Secondary | ICD-10-CM

## 2022-01-11 DIAGNOSIS — E042 Nontoxic multinodular goiter: Secondary | ICD-10-CM

## 2022-01-11 DIAGNOSIS — E538 Deficiency of other specified B group vitamins: Secondary | ICD-10-CM | POA: Diagnosis not present

## 2022-01-11 LAB — VITAMIN D 25 HYDROXY (VIT D DEFICIENCY, FRACTURES): VITD: 31.35 ng/mL (ref 30.00–100.00)

## 2022-01-11 LAB — VITAMIN B12: Vitamin B-12: 896 pg/mL (ref 211–911)

## 2022-01-11 NOTE — Patient Instructions (Signed)
  Please stop at the lab.  Please continue off Cabergoline.  Continue vitamin D 1800 IU daily and vitamin B12 1000 mcg daily.  Please return in 1 year.

## 2022-01-11 NOTE — Progress Notes (Unsigned)
Patient ID: Tomasa Rand, female   DOB: 1956/03/06, 66 y.o.   MRN: [id number]  HPI  CHESTER ROMERO is a 66 y.o.-year-old female, returning for f/u for MNG, microprolactinoma, vitamin B12, and D insufficiencies. Last visit 1 year ago.  Interim history: Patient started to feel better after stopping Cabergoline in 2021.  She had weight loss, no more dizziness, no breast discharge or fulness, no HAs, no vision pbs.  At this visit, weight is approximately stable, she has no headaches or breast tenderness, and she is asymptomatic.  She did not feel well well on Pravastatin >> now on Red Yeast Rice.  Multinodular goiter: Reviewed history: Patient complained of an enlarging left lower neck mass which was palpated at a visit with her PCP.  Thyroid U/S on 02/04/2013 showed bilateral thyroid nodules, small, less than 5 mm in the right lobe, and 3 larger nodules in the left lobe. The largest lesion is a 1.7 x 1.0 x 1.3 cm cyst situated in the left upper lobe, with post-acoustic shadowing, and colloid particles on the periphery.  Reviewed her TFTs: Lab Results  Component Value Date   TSH 1.090 01/07/2020   TSH 0.92 01/07/2019   TSH 1.42 01/05/2018   TSH 1.530 11/17/2017   TSH 0.92 12/26/2016   TSH 0.78 12/23/2015   TSH 0.63 03/06/2015   TSH 1.10 12/02/2014   TSH 0.39 03/04/2014   TSH 0.63 02/26/2013   FREET4 0.88 01/05/2018   FREET4 0.78 03/06/2015   FREET4 0.87 03/04/2014   FREET4 0.85 02/26/2013    Pt denies: - feeling nodules in neck - hoarseness - dysphagia - choking  Micro-prolactinoma: - dx in 1992 - She was initially on Cabergoline >> stopped in 2000 as her microprolactinoma shrank. - PRL in ~2004 minimally elevated (Off Cabergoline). - We tried to stay off Cabergoline in 2016, but she started to have L breast milky discharge + breast pain (lump). We checked PRL  >> increased to 122 >> started back on Cabergoline 0.25 mg bid >> levels improved >> decreased to once  weekly in 06/2015 due to dizziness and palpitations.   - However, afterwards, we tried again to increase it to twice a week and she did well on this dose, until 2021 when she again started to have palpitations and had an episode of abdominal pain.  She decreased the dose of Cabergoline to 0.25 mg weekly s in 10/2019. -In 2021, we stopped Cabergoline completely  -She declined restarting Cabergoline in 2022 -prolactin level was higher at that time  Reviewed prolactin levels: Lab Results  Component Value Date   PROLACTIN 112.3 (H) 01/11/2021   PROLACTIN 55.3 (H) 01/07/2020   PROLACTIN 33.2 (H) 01/07/2019   PROLACTIN 35.7 (H) 01/05/2018   PROLACTIN 38.5 (H) 12/26/2016   PROLACTIN 24.1 12/27/2015   PROLACTIN 13.4 07/07/2015   PROLACTIN 57.1 (H) 03/06/2015   PROLACTIN 122.2 12/02/2014   PROLACTIN 99.8 03/04/2014   PROLACTIN 76.0 02/26/2013   Other pituitary labs were normal: Somatomedin (IGF-I)     31 - 179 ng/mL 119  C206 ACTH     10 - 46 pg/mL 21  Cortisol, Plasma      12.6  FSH      83.9  LH      35.53   MRI (03/09/2014): Pituitary microadenoma measuring 6 mm.  Reviewed vitamin D levels: Lab Results  Component Value Date   VD25OH 31.9 01/11/2021   VD25OH 28.4 (L) 01/07/2020   VD25OH 27.35 (L) 01/07/2019   VD25OH  31.67 01/05/2018   VD25OH 36.12 12/26/2016   VD25OH 39.05 02/26/2016   VD25OH 27.08 (L) 12/27/2015   She continues on vitamin D, but she decreased the dose from 5000 to 1000 units daily.  She gets another 800 units a day from protein shakes.  Reviewed vitamin B12 levels: Lab Results  Component Value Date   [id number] 532 01/11/2021   VITAMINB12 645 01/07/2020   VITAMINB12 380 01/07/2019   VITAMINB12 881 01/05/2018   VITAMINB12 >1500 (H) 12/26/2016   VITAMINB12 573 02/26/2016   VITAMINB12 254 12/27/2015   She continues on 1000 mcg sl  B12 daily.  ROS: + See HPI I reviewed pt's medications, allergies, PMH, social hx, family hx, and changes were  documented in the history of present illness. Otherwise, unchanged from my initial visit note.  Past Medical History:  Diagnosis Date   Atrial fibrillation ([place]) 05/20/1998   from stress/increased caffeine use   Cataract    GERD (gastroesophageal reflux disease)    Hearing loss 05/20/2010   left ear   History of chicken pox    Pituitary adenoma ([place])    micro   Past Surgical History:  Procedure Laterality Date   CATARACT EXTRACTION     TONSILLECTOMY AND ADENOIDECTOMY  1965   Social History   Socioeconomic History   Marital status: Divorced    Spouse name: Not on file   Number of children: 3   Years of education: 12+   Highest education level: Not on file  Occupational History   Occupation: Programmer, multimedia: [employer]   Tobacco Use   Smoking status: Former    Types: E-cigarettes   Smokeless tobacco: Never  Scientific laboratory technician Use: Never used  Substance and Sexual Activity   Alcohol use: No   Drug use: No   Sexual activity: Not Currently    Birth control/protection: Abstinence  Other Topics Concern   Not on file  Social History Narrative   Regular exercise-noCaffeine Use-yes      6 grands   retired   Investment banker, operational of Radio broadcast assistant Strain: Not on file  Food Insecurity: Not on file  Transportation Needs: Not on file  Physical Activity: Not on file  Stress: Not on file  Social Connections: Not on file  Intimate Partner Violence: Not on file   Current Outpatient Medications on File Prior to Visit  Medication Sig Dispense Refill   Calcium Carbonate Antacid (TUMS PO) Take 1,000 mg by mouth as needed.     Cholecalciferol (D3 PO) Take 1,000 mg by mouth daily.     Cyanocobalamin (B-12) 1000 MCG CAPS Take 1,000 Units by mouth daily.     famotidine (PEPCID) 20 MG tablet Take 20 mg by mouth. 1-2 times daily     Magnesium 400 MG CAPS Take 400 mg by mouth daily.     No current facility-administered medications on file prior to  visit.   Allergies  Allergen Reactions   Erythromycin Shortness Of Breath and Anaphylaxis   Procaine     Other reaction(s): Chest Pain palpitations    Quinolones Other (See Comments)    Insomnia  Other reaction(s): Other Insomnia Stay awake all night   Family History  Problem Relation Age of Onset   Cancer Mother        breast, colon   Colon cancer Mother 63   Stroke Mother 62       10/18/2017   Hyperlipidemia Mother  Diabetes Maternal Grandmother    Hearing loss Maternal Grandmother    Diabetes Brother        Type 2 Diabetes   Early death Father    Arthritis Sister    PE: BP 128/72 (BP Location: Right Arm, Patient Position: Sitting, Cuff Size: Normal)   Pulse 66   Ht '5\' 8"'$  (1.727 m)   Wt 195 lb 6.4 oz (88.6 kg)   SpO2 98%   BMI 29.71 kg/m  Wt Readings from Last 3 Encounters:  01/11/22 195 lb 6.4 oz (88.6 kg)  12/11/21 194 lb 6 oz (88.2 kg)  01/11/21 196 lb (88.9 kg)   Constitutional: overweight, in NAD Eyes: no exophthalmos ENT: moist mucous membranes, no thyromegaly but palpable thyroid nodule in left lobe, no cervical lymphadenopathy Cardiovascular: RRR, No RG, +1/6 SEM Respiratory: CTA B Musculoskeletal: no deformities Skin: moist, warm, no rashes Neurological: no tremor with outstretched hands  ASSESSMENT: 1. MNG - thyroid U/S 02/04/2013: Right thyroid lobe - 4.2 x 1.5 x 1.1 cm. Only small right thyroid nodules are present of no more than 5 mm in diameter.  Left thyroid lobe - 4.9 x 2.1 x 1.9 cm. Multiple left thyroid nodules are present. The largest is complex but primarily cystic in the upper pole of the left lobe measuring 1.7 x 1.0 x 1.3 cm. Peripheral calcifications are noted within this cystic lesion. (these do appear to be not calcifications but inspissated colloid due to presence of comet tail artifact). Nodules in the lower pole of the left lobe measure 1.2 x 0.7 x 1.1 cm, and 1.0 x 0.8 x 1.8 cm. One of them is a cyst. Isthmus  -  Thickness: 3 mm  in thickness. No nodules visualized.  Lymphadenopathy  - None visualized.  2. History of microprolactinoma - dx 1992 - previously on cabergoline until 2000, when she took herself off it due to palpitations.  We restarted these afterwards and she is currently on 25 mcg twice a week - prolactin check in ~2004 was mildly elevated - has 3 children - she is now postmenopausal - no osteoporosis - MRI (03/09/2014): 1. Findings suspicious for a 6 mm focus of hypo enhancing soft  tissue arising somewhat exophyticly from the caudal aspect of the  gland, suspicious for a microadenoma in this setting. 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  2. Otherwise normal pituitary imaging.  3. Otherwise normal for age MRI appearance of the brain.   3. Vit D insuff  4. Low Vit B12   PLAN: 1. MNG  -Reviewed the results of her latest thyroid ultrasound: She has a left-sided thyroid cyst with coagulated colloid -No neck compression symptoms -TFTs were normal at last check: Lab Results  Component Value Date   TSH 1.090 01/07/2020  -No further follow-up is needed for this  2. Microprolactinoma -She has a history of a 6 mm microprolactinoma with normal pituitary hormones -In the past, she could not tolerate twice a week Cabergoline because of dizziness and palpitations, however, afterwards, she started to do well with 0.25 mg dose twice a week until right before last visit when she had palpitations and an episode of abdominal pain.  She decreased the dose by herself 0.25 mg weekly approximately 1.5 months prior to the appointment and afterwards, we stopped it completely -Her prolactin levels were increasing before, at last visit being 112.  At that time, we discussed about possibly adding back Cabergoline but she declined.  She  felt that stopping Cabergoline her dizziness resolved and she also was able to lose some weight.  Since she is postmenopausal, we  discussed about just following her prolactin level without treatment, and only restart Cabergoline if she starts to get breast tenderness or headaches. -We will recheck the prolactin level today >> if still elevated, will check an MRI  3. Vit D insuff -No muscle and joint aches -At last visit, vitamin D was normal, at 31.9 so we continued the same supplement, 5000 units daily - but not very consistent with this - at today's visit, she is in 1000 units daily as she is getting this from there smoothies: 800 units a day -We will recheck the level today  4. Low Vit B12  -No fatigue, numbness, disequilibrium -She is on 1000 mcg B12 sublingual -At last visit her B12 level was normal, at 831 -We will recheck the level today  Philemon Kingdom, MD PhD [facility] Endocrinology

## 2022-01-12 LAB — PROLACTIN: Prolactin: 75.6 ng/mL — ABNORMAL HIGH

## 2022-01-29 DIAGNOSIS — H26493 Other secondary cataract, bilateral: Secondary | ICD-10-CM | POA: Diagnosis not present

## 2022-01-29 DIAGNOSIS — Z961 Presence of intraocular lens: Secondary | ICD-10-CM | POA: Diagnosis not present

## 2022-02-11 ENCOUNTER — Encounter: Payer: Self-pay | Admitting: *Deleted

## 2022-02-18 ENCOUNTER — Encounter: Payer: Self-pay | Admitting: *Deleted

## 2022-03-05 ENCOUNTER — Ambulatory Visit: Payer: Self-pay | Admitting: Internal Medicine

## 2022-03-05 ENCOUNTER — Encounter: Payer: Self-pay | Admitting: Internal Medicine

## 2022-03-05 VITALS — BP 98/76 | HR 68 | Ht 68.0 in | Wt 194.0 lb

## 2022-03-05 DIAGNOSIS — R1310 Dysphagia, unspecified: Secondary | ICD-10-CM

## 2022-03-05 DIAGNOSIS — R1013 Epigastric pain: Secondary | ICD-10-CM | POA: Diagnosis not present

## 2022-03-05 NOTE — Patient Instructions (Signed)
_______________________________________________________  If you are age 65 or older, your body mass index should be between 23-30. Your Body mass index is 29.5 kg/m. If this is out of the aforementioned range listed, please consider follow up with your Primary Care Provider.  If you are age 58 or younger, your body mass index should be between 19-25. Your Body mass index is 29.5 kg/m. If this is out of the aformentioned range listed, please consider follow up with your Primary Care Provider.   ________________________________________________________  The [facility] providers would like to encourage you to use [facility] to communicate with providers for non-urgent requests or questions.  Due to long hold times on the telephone, sending your provider a message by [facility] may be a faster and more efficient way to get a response.  Please allow 48 business hours for a response.  Please remember that this is for non-urgent requests.  _______________________________________________________  Take Famotidine twice a day as needed  You have been scheduled for an endoscopy. Please follow written instructions given to you at your visit today. If you use inhalers (even only as needed), please bring them with you on the day of your procedure.

## 2022-03-05 NOTE — Progress Notes (Signed)
Patient ID: Crystal Bauer, female   DOB: 1956-01-25, 66 y.o.   MRN: [id number] HPI: Crystal Bauer is a 66 year old female with a history of pituitary microadenoma with prolactin secretion, remote atrial fibrillation who is seen to evaluate epigastric abdominal pain and dysphagia symptom.  She is here alone today.  She is known to me from her screening colonoscopy which I performed in July 2014.  This revealed a diminutive hyperplastic rectal polyp.  There was diverticulosis in the descending colon.  She reports that over the last "couple of years" she has had on and off epigastric and left upper quadrant burning abdominal pain.  This would come and go but over the last 2 to 3 months has been more problematic in this spurred her to discuss this with primary care and be referred to me.  She reports an intermittent burning epigastric and left upper quadrant pain.  At times this would wake her up at night.  Food does not seem to make a difference in the symptoms and does not necessarily trigger her symptoms.  She can notice it if she bends over and then stands up.  The pain would seem to be random, was never constant but could last hours.  Definitively better with Tums and famotidine.  She has been using famotidine 20 mg at bedtime over the last several months and this has led to resolution of the nocturnal symptoms.  She will occasionally use this during the daytime.  She was having some mild solid food dysphagia but this has resolved.  She has not had traditional heartburn.  She tried to change her coffee to a less acidic product which she feels may be helping somewhat.  A breath test ordered by primary care for H. pylori failed but she had a negative H. pylori blood test.  Bowel movements are mostly regular though she does tend to be constipated and uses magnesium with success.  She has no blood in stool or melena.  She takes daily B12 having previously had low B12.  She is off of her pituitary  microadenoma medication and prolactin levels are being monitored by Dr. Cruzita Lederer  Past Medical History:  Diagnosis Date   Atrial fibrillation ([place]) 05/20/1998   from stress/increased caffeine use   Cataract    GERD (gastroesophageal reflux disease)    Hearing loss 05/20/2010   left ear   History of chicken pox    Hyperplastic colon polyp    Macroprolactinemia    Pituitary adenoma ([place])    micro    Past Surgical History:  Procedure Laterality Date   CATARACT EXTRACTION     TONSILLECTOMY AND ADENOIDECTOMY  1965    Outpatient Medications Prior to Visit  Medication Sig Dispense Refill   Cholecalciferol (D3 PO) Take 1,000 mg by mouth daily.     Cyanocobalamin (B-12) 1000 MCG CAPS Take 1,000 Units by mouth daily.     famotidine (PEPCID) 20 MG tablet Take 20 mg by mouth. 1-2 times daily     Magnesium 400 MG CAPS Take 400 mg by mouth daily.     Red Yeast Rice Extract (RED YEAST RICE PO) Take by mouth.     Calcium Carbonate Antacid (TUMS PO) Take 1,000 mg by mouth as needed.     No facility-administered medications prior to visit.    Allergies  Allergen Reactions   Erythromycin Shortness Of Breath and Anaphylaxis   Procaine     Other reaction(s): Chest Pain palpitations    Quinolones Other (See Comments)  Insomnia  Other reaction(s): Other Insomnia Stay awake all night    Family History  Problem Relation Age of Onset   Cancer Mother        breast, colon   Colon cancer Mother 69   Stroke Mother 70       10/18/2017   Hyperlipidemia Mother    Early death Father    Arthritis Sister    Diabetes Brother        Type 2 Diabetes   Diabetes Maternal Grandmother    Hearing loss Maternal Grandmother    Stomach cancer Neg Hx    Esophageal cancer Neg Hx     Social History   Tobacco Use   Smoking status: Former    Types: E-cigarettes   Smokeless tobacco: Never  Vaping Use   Vaping Use: Never used  Substance Use Topics   Alcohol use: No   Drug use: No    ROS: As  per history of present illness, otherwise negative  BP 98/76   Pulse 68   Ht '5\' 8"'$  (1.727 m)   Wt 194 lb (88 kg)   BMI 29.50 kg/m  Constitutional: Well-developed and well-nourished. No distress. HEENT: Normocephalic and atraumatic.   No scleral icterus. Cardiovascular: Normal rate, regular rhythm and intact distal pulses. No M/R/G Pulmonary/chest: Effort normal and breath sounds normal. No wheezing, rales or rhonchi. Abdominal: Soft, mild tenderness in the epigastrium without rebound or guarding nondistended. Bowel sounds active throughout. There are no masses palpable. No hepatosplenomegaly. Extremities: no clubbing, cyanosis, or edema Neurological: Alert and oriented to person place and time. Skin: Skin is warm and dry.  Psychiatric: Normal mood and affect. Behavior is normal.  RELEVANT LABS AND IMAGING: CBC    Component Value Date/Time   WBC 8.3 11/22/2019 1514   RBC 4.50 11/22/2019 1514   HGB 14.7 11/22/2019 1514   HCT 44.3 11/22/2019 1514   PLT 299 11/22/2019 1514   MCV 98.4 11/22/2019 1514   MCV 92.1 12/23/2015 1525   MCH 32.7 11/22/2019 1514   MCHC 33.2 11/22/2019 1514   RDW 13.0 11/22/2019 1514    CMP     Component Value Date/Time   NA 142 11/22/2019 1514   NA 144 11/17/2017 1343   K 4.3 11/22/2019 1514   CL 107 11/22/2019 1514   CO2 26 11/22/2019 1514   GLUCOSE 96 11/22/2019 1514   BUN 15 11/22/2019 1514   BUN 14 11/17/2017 1343   CREATININE 0.93 11/22/2019 1514   CREATININE 0.84 12/23/2015 1518   CALCIUM 9.3 11/22/2019 1514   PROT 6.7 11/17/2017 1343   ALBUMIN 4.4 11/17/2017 1343   AST 22 11/17/2017 1343   ALT 29 11/17/2017 1343   ALKPHOS 60 11/17/2017 1343   BILITOT 0.3 11/17/2017 1343   GFRNONAA >60 11/22/2019 1514   GFRAA >60 11/22/2019 1514    ASSESSMENT/PLAN: 66 year old female with a history of pituitary microadenoma with prolactin secretion, remote atrial fibrillation who is seen to evaluate epigastric abdominal pain and dysphagia symptom.     Epigastric pain/left upper quadrant pain/dyspepsia/resolved dysphagia --her symptoms are suspicious for atypical GERD if she does not have true heartburn though she did have some solid food dysphagia and clearly has dyspepsia.  Seems to improve with H2 blocker/famotidine.  This warrants direct visualization -- I recommended upper endoscopy in the [place], we reviewed the risk, benefits and alternatives and she is agreeable and wishes to proceed -- For now she can continue famotidine 20 mg at bedtime but also use a dose  in the day if needed for symptom control -- GERD diet and lifestyle modifications discussed, handout given  2.  Colon cancer screening --average risk with normal colonoscopy in 2014.  Will recommend repeat screening colonoscopy around July 2024       XM:IWOEH, Catalina Lunger, [address] [place],  [place] [address]

## 2022-04-09 ENCOUNTER — Ambulatory Visit (INDEPENDENT_AMBULATORY_CARE_PROVIDER_SITE_OTHER): Payer: Medicare HMO | Admitting: Family Medicine

## 2022-04-09 ENCOUNTER — Encounter: Payer: Self-pay | Admitting: Family Medicine

## 2022-04-09 VITALS — BP 106/68 | HR 70 | Temp 98.7°F | Ht 68.0 in | Wt 195.6 lb

## 2022-04-09 DIAGNOSIS — R82998 Other abnormal findings in urine: Secondary | ICD-10-CM | POA: Diagnosis not present

## 2022-04-09 DIAGNOSIS — Z13220 Encounter for screening for lipoid disorders: Secondary | ICD-10-CM

## 2022-04-09 DIAGNOSIS — R319 Hematuria, unspecified: Secondary | ICD-10-CM

## 2022-04-09 DIAGNOSIS — R3911 Hesitancy of micturition: Secondary | ICD-10-CM

## 2022-04-09 DIAGNOSIS — R7303 Prediabetes: Secondary | ICD-10-CM | POA: Diagnosis not present

## 2022-04-09 DIAGNOSIS — Z136 Encounter for screening for cardiovascular disorders: Secondary | ICD-10-CM | POA: Diagnosis not present

## 2022-04-09 DIAGNOSIS — R809 Proteinuria, unspecified: Secondary | ICD-10-CM | POA: Diagnosis not present

## 2022-04-09 DIAGNOSIS — Z0000 Encounter for general adult medical examination without abnormal findings: Secondary | ICD-10-CM

## 2022-04-09 DIAGNOSIS — Z131 Encounter for screening for diabetes mellitus: Secondary | ICD-10-CM

## 2022-04-09 LAB — HEMOGLOBIN A1C: Hgb A1c MFr Bld: 5.8 % (ref 4.6–6.5)

## 2022-04-09 LAB — POCT URINALYSIS DIPSTICK
Bilirubin, UA: NEGATIVE
Blood, UA: POSITIVE
Glucose, UA: NEGATIVE
Ketones, UA: NEGATIVE
Nitrite, UA: NEGATIVE
Protein, UA: POSITIVE — AB
Spec Grav, UA: 1.015 (ref 1.010–1.025)
Urobilinogen, UA: 0.2 E.U./dL
pH, UA: 6 (ref 5.0–8.0)

## 2022-04-09 LAB — LIPID PANEL
Cholesterol: 210 mg/dL — ABNORMAL HIGH (ref 0–200)
HDL: 58.9 mg/dL (ref >39.00)
LDL Cholesterol: 130 mg/dL — ABNORMAL HIGH (ref 0–99)
NonHDL: 151.56
Total CHOL/HDL Ratio: 4
Triglycerides: 110 mg/dL (ref 0.0–149.0)
VLDL: 22 mg/dL (ref 0.0–40.0)

## 2022-04-09 LAB — GLUCOSE, RANDOM: Glucose, Bld: 91 mg/dL (ref 70–99)

## 2022-04-09 MED ORDER — SULFAMETHOXAZOLE-TRIMETHOPRIM 800-160 MG PO TABS: 1.0000 | ORAL_TABLET | Freq: Two times a day (BID) | ORAL | 0 refills | Status: DC

## 2022-04-09 NOTE — Progress Notes (Signed)
Phone: [phone]   Subjective:  Patient presents today for their Welcome to Medicare Exam    Preventive Screening-Counseling & Management  Urine for 1 wk-feels like has to go and can't.  Urine looks clear. No odor, no dysuria. No nocturia.   preDM-working on diet. 12/5-getting EGD. Pepcid bid.  Vision screen: dne.  Sees ophth regularly Vision Screening   Right eye Left eye Both eyes  Without correction '20/20 20/20 20/20 '$  With correction      Lives with sister  Advanced directives: none-pt will print  Modifiable Risk Factors/behavioral risk assessment/psychosocial risk assessment Regular exercise: some Diet: +  Wt Readings from Last 3 Encounters:  04/09/22 195 lb 9.6 oz (88.7 kg)  03/05/22 194 lb (88 kg)  01/11/22 195 lb 6.4 oz (88.6 kg)   Smoking Status: prev Never Smoker Second Hand Smoking status: noneNo smokers in home Alcohol intake: rare per week Other substance abuse/illicit drugs: no  Cardiac risk factors:  advanced age (older than 19 for men, 24 for women) Dad dec at 35 + Hyperlipidemia working on it no Hypertension no No diabetes. no Lab Results  Component Value Date   HGBA1C 5.5 11/17/2017   Family History: below  Family History  Problem Relation Age of Onset   Cancer Mother        breast, colon   Colon cancer Mother 23   Stroke Mother 87       10/18/2017   Hyperlipidemia Mother    Early death Father    Arthritis Sister    Diabetes Brother        Type 2 Diabetes   Diabetes Maternal Grandmother    Hearing loss Maternal Grandmother    Stomach cancer Neg Hx    Esophageal cancer Neg Hx     Depression Screen/risk evaluation Risk factors: none.Marland Kitchen PHQ2 0 normal    12/11/2021    3:00 PM 12/11/2021    2:59 PM 07/21/2019    3:24 PM 12/03/2017    3:20 PM 11/17/2017   11:31 AM  Depression screen PHQ 2/9  Decreased Interest 0 0 0 0 0  Down, Depressed, Hopeless 0 0 0 0 0  PHQ - 2 Score 0 0 0 0 0  Altered sleeping 0      Tired, decreased energy 0       Change in appetite 0      Feeling bad or failure about yourself  0      Trouble concentrating 0      Moving slowly or fidgety/restless 0      Suicidal thoughts 0      PHQ-9 Score 0      Difficult doing work/chores Not difficult at all        Functional ability and level of safety Mobility assessment: normal timed get up and go <12 seconds Activities of Daily Living- Independent in ADLs (toileting, bathing, dressing, transferring, eating) and in IADLs (shopping, housekeeping, managing own medications, and handling finances)independent Home Safety: Loose rugs (none), smoke detectors (+), small pets (+), grab bars (none), stairs (none), life-alert system (no) Hearing Difficulties: L ear-patient declines Fall Risk: noneNone     04/09/2022    1:01 PM 12/11/2021    2:51 PM 07/21/2019    3:23 PM 12/03/2017    3:20 PM 11/17/2017   11:31 AM  [place] in the past year? 0 0 1 Yes No  Number falls in past yr: 0 0 0 1   Injury with Fall? 0 0 0  Yes   Comment   wrist bruising    Risk for fall due to : No Fall Risks No Fall Risks     Follow up Falls evaluation completed Falls prevention discussed Falls evaluation completed     Opioid use history: no no long term opioids use Self assessment of health status: good  ROS: Gen: no fever, chills  Skin: no rash, itching ENT: no ear pain, ear drainage, nasal congestion, rhinorrhea, sinus pressure, sore throat Eyes: no blurry vision, double vision Resp: no cough, wheeze,SOB CV: no CP, palpitations, LE edema,  GI: no heartburn, n/v/d/c, abd pain GU:HPI MSK: no joint pain, myalgias, back pain Neuro: no dizziness, headache, weakness, vertigo Psych: no depression, anxiety, insomnia, SI   Required Immunizations needed today:  +declines for now. Immunization History  Administered Date(s) Administered   PFIZER Comirnaty(Gray Top)Covid-19 Tri-Sucrose Vaccine 08/01/2019, 08/24/2019, 03/24/2020, 09/26/2020   Pfizer Covid-19 Vaccine Bivalent  Booster 55yr & up 05/18/2021   Health Maintenance  Topic Date Due   COVID-19 Vaccine (6 - 2023-24 season) 04/25/2022*   Zoster (Shingles) Vaccine (1 of 2) 07/10/2022*   Flu Shot  08/19/2022*   Pneumonia Vaccine (1 - PCV) 04/10/2023*   DEXA scan (bone density measurement)  04/10/2023*   Colon Cancer Screening  12/15/2022   Medicare Annual Wellness Visit  04/10/2023   Mammogram  11/15/2023   Pap Smear  04/27/2024   Hepatitis C Screening: USPSTF Recommendation to screen - Ages 18-79 yo.  Completed   HIV Screening  Completed   HPV Vaccine  Aged Out  *Topic was postponed. The date shown is not the original due date.    Screening tests-  There are no preventive care reminders to display for this patient.  Colon cancer screening- due next yr-seeing GI Lung Cancer screening- quit 242yrago  intermitt prev.   Skin cancer screening- rec Prostate cancer screening No results found for: "PSA"  4. Cervical cancer screening- pap Dec 2022-and told last.  5. Breast cancer screening- July.  Sch 12/28  The following were reviewed and entered/updated in epic: Past Medical History:  Diagnosis Date   [place]rial [place] ([facility]05[place]   from stress/increased caffeine use   Cataract    GERD (gastroesophageal reflux disease)    Hearing loss 05/20/2010   left ear   History of chicken pox    Hyperplastic colon polyp    Macroprolactinemia    Pituitary adenoma ([facility]   micro   Patient Active Problem List   Diagnosis Date Noted   Fibrocystic disease of left breast in female 11/17/2017   Family history of colon cancer in mother 11/17/2017   Family history of breast cancer in mother 11/17/2017   Vitamin D insufficiency 12/26/2016   Low serum vitamin B12 12/26/2016   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   Multinodular goiter 02/26/2013   Past Surgical History:  Procedure Laterality Date   CATARACT EXTRACTION     TONSILLECTOMY AND ADENOIDECTOMY  1965    Family History  Problem Relation  Age of Onset   Cancer Mother        breast, colon   Colon cancer Mother [id number] Stroke Mother [id number]     10/18/2017   Hyperlipidemia Mother    Early death Father    Arthritis Sister    Diabetes Brother        Type 2 Diabetes   Diabetes Maternal Grandmother    Hearing loss Maternal Grandmother    Stomach cancer Neg Hx  Esophageal cancer Neg Hx     Medications- reviewed and updated Current Outpatient Medications  Medication Sig Dispense Refill   Calcium Carbonate Antacid (TUMS PO) Take 1,000 mg by mouth as needed.     Cholecalciferol (D3 PO) Take 1,000 mg by mouth daily.     Cyanocobalamin (B-12) 1000 MCG CAPS Take 1,000 Units by mouth daily.     famotidine (PEPCID) 20 MG tablet Take 20 mg by mouth. 1-2 times daily     Magnesium 400 MG CAPS Take 400 mg by mouth daily.     Red Yeast Rice Extract (RED YEAST RICE PO) Take by mouth.     No current facility-administered medications for this visit.    Allergies-reviewed and updated Allergies  Allergen Reactions   Erythromycin Shortness Of Breath and Anaphylaxis   Procaine     Other reaction(s): Chest Pain palpitations    Quinolones Other (See Comments)    Insomnia  Other reaction(s): Other Insomnia Stay awake all night    Social History   Socioeconomic History   Marital status: Divorced    Spouse name: Not on file   Number of children: 3   Years of education: 12+   Highest education level: Not on file  Occupational History   Occupation: Programmer, multimedia: [employer]    Occupation: retird  Tobacco Use   Smoking status: Former    Types: E-cigarettes   Smokeless tobacco: Never  Scientific laboratory technician Use: Never used  Substance and Sexual Activity   Alcohol use: No   Drug use: No   Sexual activity: Not Currently    Birth control/protection: Abstinence  Other Topics Concern   Not on file  Social History Narrative   Regular exercise-noCaffeine Use-yes      6 grands   retired   Investment banker, operational  of Radio broadcast assistant Strain: Not on file  Food Insecurity: Not on file  Transportation Needs: Not on file  Physical Activity: Not on file  Stress: Not on file  Social Connections: Not on file   Objective  Objective:  BP 106/68 (BP Location: Right Arm, Patient Position: Sitting)   Pulse 70   Temp 98.7 F (37.1 C) (Temporal)   Ht '5\' 8"'$  (1.727 m)   Wt 195 lb 9.6 oz (88.7 kg)   SpO2 96%   BMI 29.74 kg/m  Gen: WDWN NAD HEENT: NCAT, conjunctiva not injected, sclera nonicteric NECK:  supple, no thyromegaly, no nodes, no carotid bruits CARDIAC: RRR, S1S2+, no murmur. DP 2+B LUNGS: CTAB. No wheezes ABDOMEN:  BS+, soft, NTND, No HSM, no masses EXT:  no edema MSK: no gross abnormalities.  NEURO: A&O x3.  CN II-XII intact. Can name 15 animals PSYCH: normal mood. Good eye contact   EKG: NSR. No st changes.    Results for orders placed or performed in visit on 04/09/22  Hemoglobin A1c  Result Value Ref Range   Hgb A1c MFr Bld 5.8 4.6 - 6.5 %  Lipid panel  Result Value Ref Range   Cholesterol 210 (H) 0 - 200 mg/dL   Triglycerides 110.0 0.0 - 149.0 mg/dL   HDL 58.90 >39.00 mg/dL   VLDL 22.0 0.0 - 40.0 mg/dL   LDL Cholesterol 130 (H) 0 - 99 mg/dL   Total CHOL/HDL Ratio 4    NonHDL 151.56   Glucose, random  Result Value Ref Range   Glucose, Bld 91 70 - 99 mg/dL  POCT urinalysis dipstick  Result Value  Ref Range   Color, UA yellow    Clarity, UA clear    Glucose, UA Negative Negative   Bilirubin, UA negative    Ketones, UA negative    Spec Grav, UA 1.015 1.010 - 1.025   Blood, UA positive    pH, UA 6.0 5.0 - 8.0   Protein, UA Positive (A) Negative   Urobilinogen, UA 0.2 0.2 or 1.0 E.U./dL   Nitrite, UA negative    Leukocytes, UA Trace (A) Negative   Appearance     Odor        Assessment and Plan:   Welcome to Medicare exam completed- discussed advanced directives-pt will print out and do.  Educated, counseled and referred based on above elements Educated,  counseled and referred as appropriate for preventative needs Discussed and documented a written plan for preventiative services and screenings with personalized health advice- After Visit Summary was given to patient which included this plan cone 4. EKG offered [id number] ordered  5.  Annual physical-antic guidance. 6.  preDM-check A1C 7.  Urinary hesitancy-check UA.-UA some blood and leuks-send for cx.  Will tx w/Bactrim ds bid x 7d.      Status of chronic or acute concerns  stable No specialty comments available.  No problem-specific Assessment & Plan notes found for this encounter.   Recommended follow up: annual Future Appointments  Date Time Provider [place]  04/23/2022  3:00 PM [name], Lajuan Lines, MD [facility]  01/13/2023  1:20 PM Philemon Kingdom, MD [facility] None     Lab/Order associations:   ICD-10-CM   1. Welcome to Medicare preventive visit  Z00.00 EKG 12-Lead    2. Screening cholesterol level  Z13.220 Lipid panel    3. Screening for diabetes mellitus  Z13.1 Glucose, random    4. Prediabetes  R73.03 Hemoglobin A1c    5. Urinary hesitancy  R39.11 POCT urinalysis dipstick      No orders of the defined types were placed in this encounter.  Patient Instructions  It was very nice to see you today!  Shingles immunizations-do at pharmacy Covid 19 at the pharmacy Prevnar 20-pharm or here RSV Tdap at pharm-not sure if insurance covers   PLEASE NOTE:  If you had any lab tests please let us know if you have not heard back within a few days. You may see your results on MyChart before we have a chance to review them but we will give you a call once they are reviewed by [place]. If we ordered any referrals today, please let us know if you have not heard from their office within the next week.   Please try these tips to maintain a healthy lifestyle:  Eat most of your calories during the day when you are active. Eliminate processed foods including  packaged sweets (pies, cakes, cookies), reduce intake of potatoes, white bread, white pasta, and white rice. Look for whole grain options, oat flour or almond flour.  Each meal should contain half fruits/vegetables, one quarter protein, and one quarter carbs (no bigger than a computer mouse).  Cut down on sweet beverages. This includes juice, soda, and sweet tea. Also watch fruit intake, though this is a healthier sweet option, it still contains natural sugar! Limit to 3 servings daily.  Drink at least 1 glass of water with each meal and aim for at least 8 glasses per day  Exercise at least 150 minutes every week.      Crystal Bauer , Thank you for taking time to  come for your Medicare Wellness Visit. I appreciate your ongoing commitment to your health goals. Please review the following plan we discussed and let me know if I can assist you in the future.   These are the goals we discussed: Exercise 150 minutes per week   This is a list of the screening recommended for you and due dates:  Health Maintenance  Topic Date Due   COVID-19 Vaccine (6 - 2023-24 season) 04/25/2022*   Zoster (Shingles) Vaccine (1 of 2) 07/10/2022*   Flu Shot  08/19/2022*   Pneumonia Vaccine (1 - PCV) 04/10/2023*   DEXA scan (bone density measurement)  04/10/2023*   Colon Cancer Screening  12/15/2022   Medicare Annual Wellness Visit  04/10/2023   Mammogram  11/15/2023   Pap Smear  04/27/2024   Hepatitis C Screening: USPSTF Recommendation to screen - Ages 18-79 yo.  Completed   HIV Screening  Completed   HPV Vaccine  Aged Out  *Topic was postponed. The date shown is not the original due date.     Return precautions advised. Wellington Hampshire, MD

## 2022-04-09 NOTE — Patient Instructions (Addendum)
It was very nice to see you today!  Shingles immunizations-do at pharmacy Covid 19 at the pharmacy Prevnar 20-pharm or here RSV Tdap at pharm-not sure if insurance covers   PLEASE NOTE:  If you had any lab tests please let us know if you have not heard back within a few days. You may see your results on MyChart before we have a chance to review them but we will give you a call once they are reviewed by [place]. If we ordered any referrals today, please let us know if you have not heard from their office within the next week.   Please try these tips to maintain a healthy lifestyle:  Eat most of your calories during the day when you are active. Eliminate processed foods including packaged sweets (pies, cakes, cookies), reduce intake of potatoes, white bread, white pasta, and white rice. Look for whole grain options, oat flour or almond flour.  Each meal should contain half fruits/vegetables, one quarter protein, and one quarter carbs (no bigger than a computer mouse).  Cut down on sweet beverages. This includes juice, soda, and sweet tea. Also watch fruit intake, though this is a healthier sweet option, it still contains natural sugar! Limit to 3 servings daily.  Drink at least 1 glass of water with each meal and aim for at least 8 glasses per day  Exercise at least 150 minutes every week.      Crystal Bauer , Thank you for taking time to come for your Medicare Wellness Visit. I appreciate your ongoing commitment to your health goals. Please review the following plan we discussed and let me know if I can assist you in the future.   These are the goals we discussed: Exercise 150 minutes per week   This is a list of the screening recommended for you and due dates:  Health Maintenance  Topic Date Due   COVID-19 Vaccine (6 - 2023-24 season) 04/25/2022*   Zoster (Shingles) Vaccine (1 of 2) 07/10/2022*   Flu Shot  08/19/2022*   Pneumonia Vaccine (1 - PCV) 04/10/2023*   DEXA scan (bone density  measurement)  04/10/2023*   Colon Cancer Screening  12/15/2022   Medicare Annual Wellness Visit  04/10/2023   Mammogram  11/15/2023   Pap Smear  04/27/2024   Hepatitis C Screening: USPSTF Recommendation to screen - Ages 18-79 yo.  Completed   HIV Screening  Completed   HPV Vaccine  Aged Out  *Topic was postponed. The date shown is not the original due date.

## 2022-04-09 NOTE — Progress Notes (Signed)
D/w pt.  Continue working on diet/exercise.  Take the red yeast rice more consistently

## 2022-04-10 LAB — URINE CULTURE
MICRO NUMBER:: 14218939
SPECIMEN QUALITY:: ADEQUATE

## 2022-04-18 ENCOUNTER — Encounter: Payer: Self-pay | Admitting: Family Medicine

## 2022-04-19 ENCOUNTER — Other Ambulatory Visit: Payer: Self-pay | Admitting: *Deleted

## 2022-04-19 DIAGNOSIS — R3911 Hesitancy of micturition: Secondary | ICD-10-CM

## 2022-04-19 DIAGNOSIS — R319 Hematuria, unspecified: Secondary | ICD-10-CM

## 2022-04-19 DIAGNOSIS — R809 Proteinuria, unspecified: Secondary | ICD-10-CM

## 2022-04-23 ENCOUNTER — Ambulatory Visit (AMBULATORY_SURGERY_CENTER): Payer: Medicare HMO | Admitting: Internal Medicine

## 2022-04-23 ENCOUNTER — Encounter: Payer: Self-pay | Admitting: Internal Medicine

## 2022-04-23 VITALS — BP 110/66 | HR 55 | Temp 97.7°F | Resp 12 | Ht 68.0 in | Wt 194.0 lb

## 2022-04-23 DIAGNOSIS — K222 Esophageal obstruction: Secondary | ICD-10-CM

## 2022-04-23 DIAGNOSIS — R1012 Left upper quadrant pain: Secondary | ICD-10-CM | POA: Diagnosis not present

## 2022-04-23 DIAGNOSIS — K219 Gastro-esophageal reflux disease without esophagitis: Secondary | ICD-10-CM

## 2022-04-23 DIAGNOSIS — I4891 Unspecified atrial fibrillation: Secondary | ICD-10-CM | POA: Diagnosis not present

## 2022-04-23 DIAGNOSIS — K449 Diaphragmatic hernia without obstruction or gangrene: Secondary | ICD-10-CM

## 2022-04-23 DIAGNOSIS — R1013 Epigastric pain: Secondary | ICD-10-CM | POA: Diagnosis not present

## 2022-04-23 DIAGNOSIS — K2210 Ulcer of esophagus without bleeding: Secondary | ICD-10-CM | POA: Diagnosis not present

## 2022-04-23 MED ORDER — PANTOPRAZOLE SODIUM 40 MG PO TBEC: 40.0000 mg | DELAYED_RELEASE_TABLET | Freq: Every day | ORAL | 3 refills | Status: DC

## 2022-04-23 MED ORDER — SODIUM CHLORIDE 0.9 % IV SOLN: 500.0000 mL | Freq: Once | INTRAVENOUS | Status: DC

## 2022-04-23 NOTE — Progress Notes (Signed)
Pt's states no medical or surgical changes since previsit or office visit. 

## 2022-04-23 NOTE — Progress Notes (Signed)
GASTROENTEROLOGY PROCEDURE H&P NOTE   Primary Care Physician: Tawnya Crook, MD    Reason for Procedure:  Epigastric pain, left upper quadrant pain and dyspepsia  Plan:    Upper endoscopy  Patient is appropriate for endoscopic procedure(s) in the ambulatory ([place]) setting.  The nature of the procedure, as well as the risks, benefits, and alternatives were carefully and thoroughly reviewed with the patient. Ample time for discussion and questions allowed. The patient understood, was satisfied, and agreed to proceed.     HPI: Crystal Bauer is a 66 y.o. female who presents for EGD.  Medical history as below.  No recent chest pain or shortness of breath.  No abdominal pain today.  Past Medical History:  Diagnosis Date   Atrial fibrillation ([place]) 05/20/1998   from stress/increased caffeine use   Cataract    GERD (gastroesophageal reflux disease)    Hearing loss 05/20/2010   left ear   History of chicken pox    Hyperplastic colon polyp    Macroprolactinemia    Pituitary adenoma ([place])    micro    Past Surgical History:  Procedure Laterality Date   CATARACT EXTRACTION     TONSILLECTOMY AND ADENOIDECTOMY  1965    Prior to Admission medications   Medication Sig Start Date End Date Taking? Authorizing Provider  Cholecalciferol (D3 PO) Take 1,000 mg by mouth daily.   Yes [provider]  Cyanocobalamin (B-12) 1000 MCG CAPS Take 1,000 Units by mouth daily.   Yes [provider]  famotidine (PEPCID) 20 MG tablet Take 20 mg by mouth. 1-2 times daily   Yes [provider]  Magnesium 400 MG CAPS Take 400 mg by mouth daily.   Yes [provider]  Calcium Carbonate Antacid (TUMS PO) Take 1,000 mg by mouth as needed.    [provider]  Red Yeast Rice Extract (RED YEAST RICE PO) Take by mouth.    [provider]  sulfamethoxazole-trimethoprim (BACTRIM DS) 800-160 MG tablet Take 1 tablet by mouth 2 (two) times daily.  04/09/22   Tawnya Crook, MD    Current Outpatient Medications  Medication Sig Dispense Refill   Cholecalciferol (D3 PO) Take 1,000 mg by mouth daily.     Cyanocobalamin (B-12) 1000 MCG CAPS Take 1,000 Units by mouth daily.     famotidine (PEPCID) 20 MG tablet Take 20 mg by mouth. 1-2 times daily     Magnesium 400 MG CAPS Take 400 mg by mouth daily.     Calcium Carbonate Antacid (TUMS PO) Take 1,000 mg by mouth as needed.     Red Yeast Rice Extract (RED YEAST RICE PO) Take by mouth.     sulfamethoxazole-trimethoprim (BACTRIM DS) 800-160 MG tablet Take 1 tablet by mouth 2 (two) times daily. 14 tablet 0   Current Facility-Administered Medications  Medication Dose Route Frequency Provider Last Rate Last Admin   0.9 %  sodium chloride infusion  500 mL Intravenous Once Michaelene Dutan, Lajuan Lines, MD        Allergies as of 04/23/2022 - Review Complete 04/23/2022  Allergen Reaction Noted   Erythromycin Shortness Of Breath and Anaphylaxis 07/23/2011   Procaine  02/21/2021   Quinolones Other (See Comments) 07/23/2011    Family History  Problem Relation Age of Onset   Cancer Mother        breast, colon   Colon cancer Mother 52   Stroke Mother 56       10/18/2017   Hyperlipidemia Mother  Early death Father    Arthritis Sister    Diabetes Brother        Type 2 Diabetes   Diabetes Maternal Grandmother    Hearing loss Maternal Grandmother    Stomach cancer Neg Hx    Esophageal cancer Neg Hx     Social History   Socioeconomic History   Marital status: Divorced    Spouse name: Not on file   Number of children: 3   Years of education: 12+   Highest education level: Not on file  Occupational History   Occupation: Programmer, multimedia: [employer]    Occupation: retird  Tobacco Use   Smoking status: Former    Types: E-cigarettes   Smokeless tobacco: Never  Scientific laboratory technician Use: Never used  Substance and Sexual Activity   Alcohol use: No   Drug use: No   Sexual  activity: Not Currently    Birth control/protection: Abstinence  Other Topics Concern   Not on file  Social History Narrative   Regular exercise-noCaffeine Use-yes      6 grands   retired   Investment banker, operational of Radio broadcast assistant Strain: Not on file  Food Insecurity: Not on file  Transportation Needs: Not on file  Physical Activity: Not on file  Stress: Not on file  Social Connections: Not on file  Intimate Partner Violence: Not on file    Physical Exam: Vital signs in last 24 hours: '@BP'$  (!) 104/58   Pulse 78   Temp 97.7 F (36.5 C)   Ht '5\' 8"'$  (1.727 m)   Wt 194 lb (88 kg)   SpO2 97%   BMI 29.50 kg/m  GEN: NAD EYE: Sclerae anicteric ENT: MMM CV: Non-tachycardic Pulm: CTA b/l GI: Soft, NT/ND NEURO:  Alert & Oriented x 3   Zenovia Jarred, MD [facility]  04/23/2022 3:19 PM

## 2022-04-23 NOTE — Progress Notes (Signed)
A and O x3. Report to RN. Tolerated MAC anesthesia well. 

## 2022-04-23 NOTE — Patient Instructions (Addendum)
-   Resume previous diet. - Continue present medications. - Begin pantoprazole 40 mg once daily. Famotidine 20 mg can still be used in the evening if needed for breakthrough heartburn. - Await pathology results. - Repeat EGD in 8 weeks to ensure healing of esophagitis.  YOU HAD AN ENDOSCOPIC PROCEDURE TODAY AT [facility]:   Refer to the procedure report that was given to you for any specific questions about what was found during the examination.  If the procedure report does not answer your questions, please call your gastroenterologist to clarify.  If you requested that your care partner not be given the details of your procedure findings, then the procedure report has been included in a sealed envelope for you to review at your convenience later.  YOU SHOULD EXPECT: Some feelings of bloating in the abdomen. Passage of more gas than usual.  Walking can help get rid of the air that was put into your GI tract during the procedure and reduce the bloating. If you had a lower endoscopy (such as a colonoscopy or flexible sigmoidoscopy) you may notice spotting of blood in your stool or on the toilet paper. If you underwent a bowel prep for your procedure, you may not have a normal bowel movement for a few days.  Please Note:  You might notice some irritation and congestion in your nose or some drainage.  This is from the oxygen used during your procedure.  There is no need for concern and it should clear up in a day or so.  SYMPTOMS TO REPORT IMMEDIATELY:   Following upper endoscopy (EGD)  Vomiting of blood or coffee ground material  New chest pain or pain under the shoulder blades  Painful or persistently difficult swallowing  New shortness of breath  Fever of 100F or higher  Black, tarry-looking stools  For urgent or emergent issues, a gastroenterologist can be reached at any hour by calling [phone]. Do not use MyChart messaging for urgent concerns.    DIET:  We do  recommend a small meal at first, but then you may proceed to your regular diet.  Drink plenty of fluids but you should avoid alcoholic beverages for 24 hours.  ACTIVITY:  You should plan to take it easy for the rest of today and you should NOT DRIVE or use heavy machinery until tomorrow (because of the sedation medicines used during the test).    FOLLOW UP: Our staff will call the number listed on your records the next business day following your procedure.  We will call around 7:15- 8:00 am to check on you and address any questions or concerns that you may have regarding the information given to you following your procedure. If we do not reach you, we will leave a message.     If any biopsies were taken you will be contacted by phone or by letter within the next 1-3 weeks.  Please call us at [phone] if you have not heard about the biopsies in 3 weeks.    SIGNATURES/CONFIDENTIALITY: You and/or your care partner have signed paperwork which will be entered into your electronic medical record.  These signatures attest to the fact that that the information above on your After Visit Summary has been reviewed and is understood.  Full responsibility of the confidentiality of this discharge information lies with you and/or your care-partner.

## 2022-04-23 NOTE — Progress Notes (Signed)
Called to room to assist during endoscopic procedure.  Patient ID and intended procedure confirmed with present staff. Received instructions for my participation in the procedure from the performing physician.  

## 2022-04-23 NOTE — Op Note (Signed)
[place] Patient Name: Mailin Coglianese Procedure Date: 04/23/2022 3:11 PM MRN: [id number] Endoscopist: Jerene Bears , MD, [id number] Age: 66 Referring MD:  Date of Birth: [birth year] Gender: Female Account #: [id number] Procedure:                Upper GI endoscopy Indications:              Abdominal pain in the left upper quadrant,                            Dyspepsia, resolved dysphagia with BID famotidine Medicines:                Monitored Anesthesia Care Procedure:                Pre-Anesthesia Assessment:                           - Prior to the procedure, a History and Physical                            was performed, and patient medications and                            allergies were reviewed. The patient's tolerance of                            previous anesthesia was also reviewed. The risks                            and benefits of the procedure and the sedation                            options and risks were discussed with the patient.                            All questions were answered, and informed consent                            was obtained. Prior Anticoagulants: The patient has                            taken no anticoagulant or antiplatelet agents. ASA                            Grade Assessment: II - A patient with mild systemic                            disease. After reviewing the risks and benefits,                            the patient was deemed in satisfactory condition to                            undergo the procedure.  After obtaining informed consent, the endoscope was                            passed under direct vision. Throughout the                            procedure, the patient's blood pressure, pulse, and                            oxygen saturations were monitored continuously. The                            Endoscope was introduced through the mouth, and                            advanced  to the second part of duodenum. The upper                            GI endoscopy was accomplished without difficulty.                            The patient tolerated the procedure well. Scope In: Scope Out: Findings:                 LA Grade D (one or more mucosal breaks involving at                            least 75% of esophageal circumference) esophagitis                            was found 35 to 36 cm from the incisors. Biopsies                            were taken with a cold forceps for histology.                           A 4 cm hiatal hernia was present.                           The entire examined stomach was normal. Biopsies                            were taken with a cold forceps for histology and                            Helicobacter pylori testing.                           The examined duodenum was normal. Complications:            No immediate complications. Estimated Blood Loss:     Estimated blood loss was minimal. Impression:               - LA Grade D reflux esophagitis. Biopsied.                           -  4 cm hiatal hernia.                           - Normal stomach. Biopsied.                           - Normal examined duodenum. Recommendation:           - Patient has a contact number available for                            emergencies. The signs and symptoms of potential                            delayed complications were discussed with the                            patient. Return to normal activities tomorrow.                            Written discharge instructions were provided to the                            patient.                           - Resume previous diet.                           - Continue present medications.                           - Begin pantoprazole 40 mg once daily. Famotidine                            20 mg can still be used in the evening if needed                            for breakthrough heartburn.                            - Await pathology results.                           - Repeat EGD in 8 weeks to ensure healing of                            esophagitis. Jerene Bears, MD 04/23/2022 3:44:25 PM This report has been signed electronically.

## 2022-04-24 ENCOUNTER — Telehealth: Payer: Self-pay | Admitting: *Deleted

## 2022-04-24 NOTE — Telephone Encounter (Signed)
  Follow up Call-     04/23/2022    2:40 PM  Call back number  Post procedure Call Back phone  # [phone]  Permission to leave phone message Yes     Patient questions:  Do you have a fever, pain , or abdominal swelling? No. Pain Score  0 *  Have you tolerated food without any problems? Yes.    Have you been able to return to your normal activities? Yes.    Do you have any questions about your discharge instructions: Diet   No. Medications  No. Follow up visit  No.  Do you have questions or concerns about your Care? No.  Actions: * If pain score is 4 or above: No action needed, pain <4.

## 2022-04-26 ENCOUNTER — Encounter: Payer: Self-pay | Admitting: Internal Medicine

## 2022-05-16 DIAGNOSIS — R92323 Mammographic fibroglandular density, bilateral breasts: Secondary | ICD-10-CM | POA: Diagnosis not present

## 2022-05-16 DIAGNOSIS — Z1231 Encounter for screening mammogram for malignant neoplasm of breast: Secondary | ICD-10-CM | POA: Diagnosis not present

## 2022-06-17 ENCOUNTER — Encounter: Payer: Self-pay | Admitting: Internal Medicine

## 2022-06-25 ENCOUNTER — Ambulatory Visit: Payer: Medicare PPO | Admitting: Internal Medicine

## 2022-06-25 ENCOUNTER — Encounter: Payer: Self-pay | Admitting: Internal Medicine

## 2022-06-25 ENCOUNTER — Telehealth: Payer: Self-pay | Admitting: *Deleted

## 2022-06-25 VITALS — BP 119/70 | HR 57 | Temp 98.0°F | Resp 12 | Ht 68.0 in | Wt 194.0 lb

## 2022-06-25 DIAGNOSIS — K209 Esophagitis, unspecified without bleeding: Secondary | ICD-10-CM

## 2022-06-25 DIAGNOSIS — I4891 Unspecified atrial fibrillation: Secondary | ICD-10-CM | POA: Diagnosis not present

## 2022-06-25 MED ORDER — SODIUM CHLORIDE 0.9 % IV SOLN: 500.0000 mL | Freq: Once | INTRAVENOUS | Status: DC

## 2022-06-25 MED ORDER — PANTOPRAZOLE SODIUM 40 MG PO TBEC: DELAYED_RELEASE_TABLET | ORAL | 3 refills | Status: DC

## 2022-06-25 NOTE — Progress Notes (Signed)
Called to room to assist during endoscopic procedure.  Patient ID and intended procedure confirmed with present staff. Received instructions for my participation in the procedure from the performing physician.  

## 2022-06-25 NOTE — Op Note (Signed)
[place] Patient Name: Crystal Bauer Procedure Date: 06/25/2022 1:43 PM MRN: [id number] Endoscopist: Jerene Bears , MD, [id number] Age: 67 Referring MD:  Date of Birth: 1956/01/14 Gender: Female Account #: [id number] Procedure:                Upper GI endoscopy Indications:              Follow-up of reflux esophagitis (Grade D; EGD Apr 23, 2022), pantoprazole 40 mg once daily started at                            that time Medicines:                Monitored Anesthesia Care Procedure:                Pre-Anesthesia Assessment:                           - Prior to the procedure, a History and Physical                            was performed, and patient medications and                            allergies were reviewed. The patient's tolerance of                            previous anesthesia was also reviewed. The risks                            and benefits of the procedure and the sedation                            options and risks were discussed with the patient.                            All questions were answered, and informed consent                            was obtained. Prior Anticoagulants: The patient has                            taken no anticoagulant or antiplatelet agents. ASA                            Grade Assessment: II - A patient with mild systemic                            disease. After reviewing the risks and benefits,                            the patient was deemed in satisfactory condition to  undergo the procedure.                           After obtaining informed consent, the endoscope was                            passed under direct vision. Throughout the                            procedure, the patient's blood pressure, pulse, and                            oxygen saturations were monitored continuously. The                            Olympus Scope [id number] was introduced  through the                            mouth, and advanced to the second part of duodenum.                            The upper GI endoscopy was accomplished without                            difficulty. The patient tolerated the procedure                            well. Scope In: Scope Out: Findings:                 LA Grade B (one or more mucosal breaks greater than                            5 mm, not extending between the tops of two mucosal                            folds) esophagitis with no bleeding was found 35 to                            36 cm from the incisors. Biopsies were taken with a                            cold forceps for histology.                           A 4 cm hiatal hernia was found.                           The entire examined stomach was normal.                           The examined duodenum was normal. Complications:            No immediate complications. Estimated Blood Loss:     Estimated blood loss was minimal. Impression:               -  Improved, but persistent LA Grade B reflux                            esophagitis with no bleeding. Biopsied.                           - 4 cm hiatal hernia.                           - Normal stomach.                           - Normal examined duodenum. Recommendation:           - Patient has a contact number available for                            emergencies. The signs and symptoms of potential                            delayed complications were discussed with the                            patient. Return to normal activities tomorrow.                            Written discharge instructions were provided to the                            patient.                           - Resume previous diet.                           - Continue present medications. Increase                            pantoprazole to 40 mg BID-AC.                           - Await pathology results.                           -  Consider repeat upper endoscopy in 2 months for                            surveillance. Jerene Bears, MD 06/25/2022 1:57:03 PM This report has been signed electronically.

## 2022-06-25 NOTE — Progress Notes (Signed)
Pt's states no medical or surgical changes since previsit or office visit. 

## 2022-06-25 NOTE — Patient Instructions (Signed)
   Handout on esophagitis given to you today  Await esophageal biopsy results   Repeat upper endoscopy in 2 months -     YOU HAD AN ENDOSCOPIC PROCEDURE TODAY AT [facility]:   Refer to the procedure report that was given to you for any specific questions about what was found during the examination.  If the procedure report does not answer your questions, please call your gastroenterologist to clarify.  If you requested that your care partner not be given the details of your procedure findings, then the procedure report has been included in a sealed envelope for you to review at your convenience later.  YOU SHOULD EXPECT: Some feelings of bloating in the abdomen. Passage of more gas than usual.  Walking can help get rid of the air that was put into your GI tract during the procedure and reduce the bloating. If you had a lower endoscopy (such as a colonoscopy or flexible sigmoidoscopy) you may notice spotting of blood in your stool or on the toilet paper. If you underwent a bowel prep for your procedure, you may not have a normal bowel movement for a few days.  Please Note:  You might notice some irritation and congestion in your nose or some drainage.  This is from the oxygen used during your procedure.  There is no need for concern and it should clear up in a day or so.  SYMPTOMS TO REPORT IMMEDIATELY:   Following upper endoscopy (EGD)  Vomiting of blood or coffee ground material  New chest pain or pain under the shoulder blades  Painful or persistently difficult swallowing  New shortness of breath  Fever of 100F or higher  Black, tarry-looking stools  For urgent or emergent issues, a gastroenterologist can be reached at any hour by calling [phone]. Do not use MyChart messaging for urgent concerns.    DIET:  We do recommend a small meal at first, but then you may proceed to your regular diet.  Drink plenty of fluids but you should avoid alcoholic beverages for  24 hours.  ACTIVITY:  You should plan to take it easy for the rest of today and you should NOT DRIVE or use heavy machinery until tomorrow (because of the sedation medicines used during the test).    FOLLOW UP: Our staff will call the number listed on your records the next business day following your procedure.  We will call around 7:15- 8:00 am to check on you and address any questions or concerns that you may have regarding the information given to you following your procedure. If we do not reach you, we will leave a message.     If any biopsies were taken you will be contacted by phone or by letter within the next 1-3 weeks.  Please call us at [phone] if you have not heard about the biopsies in 3 weeks.    SIGNATURES/CONFIDENTIALITY: You and/or your care partner have signed paperwork which will be entered into your electronic medical record.  These signatures attest to the fact that that the information above on your After Visit Summary has been reviewed and is understood.  Full responsibility of the confidentiality of this discharge information lies with you and/or your care-partner.

## 2022-06-25 NOTE — Progress Notes (Signed)
GASTROENTEROLOGY PROCEDURE H&P NOTE   Primary Care Physician: Tawnya Crook, MD    Reason for Procedure:  Surveillance for esophagitis  Plan:    EGD  Patient is appropriate for endoscopic procedure(s) in the ambulatory ([facility]) setting.  The nature of the procedure, as well as the risks, benefits, and alternatives were carefully and thoroughly reviewed with the patient. Ample time for discussion and questions allowed. The patient understood, was satisfied, and agreed to proceed.     HPI: Crystal Bauer is a 67 y.o. female who presents for EGD.  Medical history as below.  No recent chest pain or shortness of breath.  No abdominal pain today.  Past Medical History:  Diagnosis Date   Atrial fibrillation ([place]) 05/20/1998   from stress/increased caffeine use   Cataract    GERD (gastroesophageal reflux disease)    Hearing loss 05/20/2010   left ear   History of chicken pox    Hyperplastic colon polyp    Macroprolactinemia    Pituitary adenoma ([facility])    micro    Past Surgical History:  Procedure Laterality Date   CATARACT EXTRACTION     TONSILLECTOMY AND ADENOIDECTOMY  1965    Prior to Admission medications   Medication Sig Start Date End Date Taking? Authorizing Provider  famotidine (PEPCID) 20 MG tablet Take 20 mg by mouth. 1-2 times daily   Yes [provider]  pantoprazole (PROTONIX) 40 MG tablet Take 1 tablet (40 mg total) by mouth daily. 04/23/22  Yes Palyn Scrima, Lajuan Lines, MD  Calcium Carbonate Antacid (TUMS PO) Take 1,000 mg by mouth as needed.    [provider]  Cholecalciferol (D3 PO) Take 1,000 mg by mouth daily.    [provider]  Cyanocobalamin (B-12) 1000 MCG CAPS Take 1,000 Units by mouth daily.    [provider]  Magnesium 400 MG CAPS Take 400 mg by mouth daily.    [provider]  Red Yeast Rice Extract (RED YEAST RICE PO) Take by mouth. Patient not taking: Reported on 06/25/2022    [provider]   sulfamethoxazole-trimethoprim (BACTRIM DS) 800-160 MG tablet Take 1 tablet by mouth 2 (two) times daily. 04/09/22   Tawnya Crook, MD    Current Outpatient Medications  Medication Sig Dispense Refill   famotidine (PEPCID) 20 MG tablet Take 20 mg by mouth. 1-2 times daily     pantoprazole (PROTONIX) 40 MG tablet Take 1 tablet (40 mg total) by mouth daily. 30 tablet 3   Calcium Carbonate Antacid (TUMS PO) Take 1,000 mg by mouth as needed.     Cholecalciferol (D3 PO) Take 1,000 mg by mouth daily.     Cyanocobalamin (B-12) 1000 MCG CAPS Take 1,000 Units by mouth daily.     Magnesium 400 MG CAPS Take 400 mg by mouth daily.     Red Yeast Rice Extract (RED YEAST RICE PO) Take by mouth. (Patient not taking: Reported on 06/25/2022)     sulfamethoxazole-trimethoprim (BACTRIM DS) 800-160 MG tablet Take 1 tablet by mouth 2 (two) times daily. 14 tablet 0   Current Facility-Administered Medications  Medication Dose Route Frequency Provider Last Rate Last Admin   0.9 %  sodium chloride infusion  500 mL Intravenous Once [name], Lajuan Lines, MD        Allergies as of 06/25/2022 - Review Complete 06/25/2022  Allergen Reaction Noted   Erythromycin Shortness Of Breath and Anaphylaxis 07/23/2011   Procaine  02/21/2021   Quinolones Other (See Comments) 07/23/2011  Family History  Problem Relation Age of Onset   Cancer Mother        breast, colon   Colon cancer Mother 57   Stroke Mother 49       10/18/2017   Hyperlipidemia Mother    Early death Father    Arthritis Sister    Diabetes Brother        Type 2 Diabetes   Diabetes Maternal Grandmother    Hearing loss Maternal Grandmother    Stomach cancer Neg Hx    Esophageal cancer Neg Hx     Social History   Socioeconomic History   Marital status: Divorced    Spouse name: Not on file   Number of children: 3   Years of education: 12+   Highest education level: Not on file  Occupational History   Occupation: Programmer, multimedia: [employer]    Occupation: retird  Tobacco Use   Smoking status: Former    Types: E-cigarettes   Smokeless tobacco: Never  Scientific laboratory technician Use: Never used  Substance and Sexual Activity   Alcohol use: No   Drug use: No   Sexual activity: Not Currently    Birth control/protection: Abstinence  Other Topics Concern   Not on file  Social History Narrative   Regular exercise-noCaffeine Use-yes      6 grands   retired   Investment banker, operational of Radio broadcast assistant Strain: Not on file  Food Insecurity: Not on file  Transportation Needs: Not on file  Physical Activity: Not on file  Stress: Not on file  Social Connections: Not on file  Intimate Partner Violence: Not on file    Physical Exam: Vital signs in last 24 hours: '@BP'$  (!) 95/47   Pulse (!) 59   Temp 98 F (36.7 C) (Temporal)   Ht '5\' 8"'$  (1.727 m)   Wt 194 lb (88 kg)   SpO2 97%   BMI 29.50 kg/m  GEN: NAD EYE: Sclerae anicteric ENT: MMM CV: Non-tachycardic Pulm: CTA b/l GI: Soft, NT/ND NEURO:  Alert & Oriented x 3   Zenovia Jarred, MD [facility]  06/25/2022 1:45 PM

## 2022-06-25 NOTE — Telephone Encounter (Signed)
PT scheduled for repeat EGD in 8 weeks. Procedure instructions reviewed with patient.

## 2022-06-25 NOTE — Progress Notes (Signed)
To pacu, VSS. Report to rn.tb °

## 2022-06-26 ENCOUNTER — Telehealth: Payer: Self-pay

## 2022-06-26 NOTE — Telephone Encounter (Signed)
  Follow up Call-     06/25/2022    1:07 PM 04/23/2022    2:40 PM  Call back number  Post procedure Call Back phone  # [phone]  Permission to leave phone message Yes Yes     Patient questions:  Do you have a fever, pain , or abdominal swelling? No. Pain Score  0 *  Have you tolerated food without any problems? Yes.    Have you been able to return to your normal activities? Yes.    Do you have any questions about your discharge instructions: Diet   No. Medications  No. Follow up visit  No.  Do you have questions or concerns about your Care? No.  Actions: * If pain score is 4 or above: No action needed, pain <4.

## 2022-07-02 ENCOUNTER — Encounter: Payer: Self-pay | Admitting: Internal Medicine

## 2022-07-24 ENCOUNTER — Encounter: Payer: Self-pay | Admitting: Internal Medicine

## 2022-08-20 ENCOUNTER — Encounter: Payer: Self-pay | Admitting: Internal Medicine

## 2022-08-20 ENCOUNTER — Ambulatory Visit (AMBULATORY_SURGERY_CENTER): Payer: Medicare PPO | Admitting: Internal Medicine

## 2022-08-20 VITALS — BP 118/71 | HR 60 | Temp 96.8°F | Resp 11 | Ht 68.0 in | Wt 194.0 lb

## 2022-08-20 DIAGNOSIS — K2100 Gastro-esophageal reflux disease with esophagitis, without bleeding: Secondary | ICD-10-CM | POA: Diagnosis not present

## 2022-08-20 DIAGNOSIS — I4891 Unspecified atrial fibrillation: Secondary | ICD-10-CM | POA: Diagnosis not present

## 2022-08-20 DIAGNOSIS — K2270 Barrett's esophagus without dysplasia: Secondary | ICD-10-CM | POA: Diagnosis not present

## 2022-08-20 DIAGNOSIS — K209 Esophagitis, unspecified without bleeding: Secondary | ICD-10-CM

## 2022-08-20 MED ORDER — SODIUM CHLORIDE 0.9 % IV SOLN: 500.0000 mL | Freq: Once | INTRAVENOUS | Status: DC

## 2022-08-20 NOTE — Op Note (Signed)
[place] Patient Name: Crystal Bauer Procedure Date: 08/20/2022 3:09 PM MRN: [id number] Endoscopist: Jerene Bears , MD, [id number] Age: 67 Referring MD:  Date of Birth: [birth year] Gender: Female Account #: [id number] Procedure:                Upper GI endoscopy Indications:              Follow-up of reflux esophagitis (LA Grade D in Dec                            2023, LA Grade B in Feb 2022 on once daily PPI),                            now on BID PPI (pantoprazole 40 mg BID); symptoms                            now significantly improved Medicines:                Monitored Anesthesia Care Procedure:                Pre-Anesthesia Assessment:                           - Prior to the procedure, a History and Physical                            was performed, and patient medications and                            allergies were reviewed. The patient's tolerance of                            previous anesthesia was also reviewed. The risks                            and benefits of the procedure and the sedation                            options and risks were discussed with the patient.                            All questions were answered, and informed consent                            was obtained. Prior Anticoagulants: The patient has                            taken no anticoagulant or antiplatelet agents. ASA                            Grade Assessment: II - A patient with mild systemic                            disease. After reviewing the risks and benefits,  the patient was deemed in satisfactory condition to                            undergo the procedure.                           After obtaining informed consent, the endoscope was                            passed under direct vision. Throughout the                            procedure, the patient's blood pressure, pulse, and                            oxygen saturations were  monitored continuously. The                            GIF [id number] was introduced through the                            mouth, and advanced to the second part of duodenum.                            The upper GI endoscopy was accomplished without                            difficulty. The patient tolerated the procedure                            well. Scope In: Scope Out: Findings:                 The Z-line was irregular and was found 35 to 36 cm                            from the incisors. Biopsies were taken with a cold                            forceps for histology to exclude Barrett's                            metaplasia. The esophagitis has healed.                           A 4 cm hiatal hernia was present.                           The entire examined stomach was normal.                           The examined duodenum was normal. Complications:            No immediate complications. Estimated Blood Loss:     Estimated blood loss was minimal. Impression:               -  Z-line irregular, 35 to 36 cm from the incisors.                            Biopsied to exclude Barrett's.                           - Esophagitis has healed on BID PPI.                           - 4 cm hiatal hernia.                           - Normal stomach.                           - Normal examined duodenum. Recommendation:           - Patient has a contact number available for                            emergencies. The signs and symptoms of potential                            delayed complications were discussed with the                            patient. Return to normal activities tomorrow.                            Written discharge instructions were provided to the                            patient.                           - Resume previous diet.                           - Continue present medications including                            pantoprazole 40 mg BID-AC.                            - Await pathology results.                           - Depending on patient preference BID PPI is                            recommended, but HH repair with fundoplication                            would be an option. Jerene Bears, MD 08/20/2022 3:27:46 PM This report has been signed electronically.

## 2022-08-20 NOTE — Patient Instructions (Addendum)
-   Resume previous diet. - Continue present medications including pantoprazole 40 mg BID-AC. - Await pathology results.   YOU HAD AN ENDOSCOPIC PROCEDURE TODAY AT [facility]:   Refer to the procedure report that was given to you for any specific questions about what was found during the examination.  If the procedure report does not answer your questions, please call your gastroenterologist to clarify.  If you requested that your care partner not be given the details of your procedure findings, then the procedure report has been included in a sealed envelope for you to review at your convenience later.  YOU SHOULD EXPECT: Some feelings of bloating in the abdomen. Passage of more gas than usual.  Walking can help get rid of the air that was put into your GI tract during the procedure and reduce the bloating. If you had a lower endoscopy (such as a colonoscopy or flexible sigmoidoscopy) you may notice spotting of blood in your stool or on the toilet paper. If you underwent a bowel prep for your procedure, you may not have a normal bowel movement for a few days.  Please Note:  You might notice some irritation and congestion in your nose or some drainage.  This is from the oxygen used during your procedure.  There is no need for concern and it should clear up in a day or so.  SYMPTOMS TO REPORT IMMEDIATELY:   Following upper endoscopy (EGD)  Vomiting of blood or coffee ground material  New chest pain or pain under the shoulder blades  Painful or persistently difficult swallowing  New shortness of breath  Fever of 100F or higher  Black, tarry-looking stools  For urgent or emergent issues, a gastroenterologist can be reached at any hour by calling [phone]. Do not use MyChart messaging for urgent concerns.    DIET:  We do recommend a small meal at first, but then you may proceed to your regular diet.  Drink plenty of fluids but you should avoid alcoholic beverages for 24  hours.  ACTIVITY:  You should plan to take it easy for the rest of today and you should NOT DRIVE or use heavy machinery until tomorrow (because of the sedation medicines used during the test).    FOLLOW UP: Our staff will call the number listed on your records the next business day following your procedure.  We will call around 7:15- 8:00 am to check on you and address any questions or concerns that you may have regarding the information given to you following your procedure. If we do not reach you, we will leave a message.     If any biopsies were taken you will be contacted by phone or by letter within the next 1-3 weeks.  Please call us at [phone] if you have not heard about the biopsies in 3 weeks.    SIGNATURES/CONFIDENTIALITY: You and/or your care partner have signed paperwork which will be entered into your electronic medical record.  These signatures attest to the fact that that the information above on your After Visit Summary has been reviewed and is understood.  Full responsibility of the confidentiality of this discharge information lies with you and/or your care-partner.

## 2022-08-20 NOTE — Progress Notes (Signed)
Called to room to assist during endoscopic procedure.  Patient ID and intended procedure confirmed with present staff. Received instructions for my participation in the procedure from the performing physician.  

## 2022-08-20 NOTE — Progress Notes (Signed)
GASTROENTEROLOGY PROCEDURE H&P NOTE   Primary Care Physician: Tawnya Crook, MD    Reason for Procedure:  GERD with esophagitis  Plan:    EGD  Patient is appropriate for endoscopic procedure(s) in the ambulatory ([place]) setting.  The nature of the procedure, as well as the risks, benefits, and alternatives were carefully and thoroughly reviewed with the patient. Ample time for discussion and questions allowed. The patient understood, was satisfied, and agreed to proceed.     HPI: Crystal Bauer is a 67 y.o. female who presents for EGD.  Medical history as below.  No recent chest pain or shortness of breath.  No abdominal pain today.  Past Medical History:  Diagnosis Date   Atrial fibrillation 05/20/1998   from stress/increased caffeine use   Cataract    GERD (gastroesophageal reflux disease)    Hearing loss 05/20/2010   left ear   History of chicken pox    Hyperplastic colon polyp    Macroprolactinemia    Pituitary adenoma    micro    Past Surgical History:  Procedure Laterality Date   CATARACT EXTRACTION     TONSILLECTOMY AND ADENOIDECTOMY  1965    Prior to Admission medications   Medication Sig Start Date End Date Taking? Authorizing Provider  Cyanocobalamin (B-12) 1000 MCG CAPS Take 1,000 Units by mouth daily.   Yes [provider]  Magnesium 400 MG CAPS Take 400 mg by mouth daily.   Yes [provider]  pantoprazole (PROTONIX) 40 MG tablet Increase Pantoprazole to 40 mg BID-AC 06/25/22  Yes Aldridge Krzyzanowski, Lajuan Lines, MD  Calcium Carbonate Antacid (TUMS PO) Take 1,000 mg by mouth as needed.    [provider]  Cholecalciferol (D3 PO) Take 1,000 mg by mouth daily.    [provider]  famotidine (PEPCID) 20 MG tablet Take 20 mg by mouth. 1-2 times daily    [provider]    Current Outpatient Medications  Medication Sig Dispense Refill   Cyanocobalamin (B-12) 1000 MCG CAPS Take 1,000 Units by mouth daily.     Magnesium  400 MG CAPS Take 400 mg by mouth daily.     pantoprazole (PROTONIX) 40 MG tablet Increase Pantoprazole to 40 mg BID-AC 60 tablet 3   Calcium Carbonate Antacid (TUMS PO) Take 1,000 mg by mouth as needed.     Cholecalciferol (D3 PO) Take 1,000 mg by mouth daily.     famotidine (PEPCID) 20 MG tablet Take 20 mg by mouth. 1-2 times daily     Current Facility-Administered Medications  Medication Dose Route Frequency Provider Last Rate Last Admin   0.9 %  sodium chloride infusion  500 mL Intravenous Once Benjerman Molinelli, Lajuan Lines, MD        Allergies as of 08/20/2022 - Review Complete 08/20/2022  Allergen Reaction Noted   Erythromycin Shortness Of Breath and Anaphylaxis 07/23/2011   Procaine  02/21/2021   Quinolones Other (See Comments) 07/23/2011    Family History  Problem Relation Age of Onset   Cancer Mother        breast, colon   Colon cancer Mother 59   Stroke Mother 14       10/18/2017   Hyperlipidemia Mother    Early death Father    Arthritis Sister    Diabetes Brother        Type 2 Diabetes   Esophageal cancer Maternal Uncle    Diabetes Maternal Grandmother    Hearing loss Maternal Grandmother    Stomach cancer  Neg Hx     Social History   Socioeconomic History   Marital status: Divorced    Spouse name: Not on file   Number of children: 3   Years of education: 12+   Highest education level: Not on file  Occupational History   Occupation: Programmer, multimedia: [employer]    Occupation: retird  Tobacco Use   Smoking status: Former    Types: E-cigarettes   Smokeless tobacco: Never  Scientific laboratory technician Use: Never used  Substance and Sexual Activity   Alcohol use: No   Drug use: No   Sexual activity: Not Currently    Birth control/protection: Abstinence  Other Topics Concern   Not on file  Social History Narrative   Regular exercise-noCaffeine Use-yes      6 grands   retired   Investment banker, operational of Radio broadcast assistant Strain: Not on file  Food  Insecurity: Not on file  Transportation Needs: Not on file  Physical Activity: Not on file  Stress: Not on file  Social Connections: Not on file  Intimate Partner Violence: Not on file    Physical Exam: Vital signs in last 24 hours: @BP  109/76   Pulse 68   Temp (!) 96.8 F (36 C)   Ht 5\' 8"  (1.727 m)   Wt 194 lb (88 kg)   SpO2 96%   BMI 29.50 kg/m  GEN: NAD EYE: Sclerae anicteric ENT: MMM CV: Non-tachycardic Pulm: CTA b/l GI: Soft, NT/ND NEURO:  Alert & Oriented x 3   Zenovia Jarred, MD [facility]  08/20/2022 2:56 PM

## 2022-08-20 NOTE — Progress Notes (Signed)
Pt's states no medical or surgical changes since previsit or office visit. 

## 2022-08-20 NOTE — Progress Notes (Signed)
Pt in recovery with monitors in place, VSS. Report given to receiving RN. Bite guard was placed with pt awake to ensure comfort. No dental or soft tissue damage noted. 

## 2022-08-21 ENCOUNTER — Telehealth: Payer: Self-pay

## 2022-08-21 DIAGNOSIS — M545 Low back pain, unspecified: Secondary | ICD-10-CM | POA: Diagnosis not present

## 2022-08-21 NOTE — Telephone Encounter (Signed)
  Follow up Call-     08/20/2022    2:08 PM 06/25/2022    1:07 PM 04/23/2022    2:40 PM  Call back number  Post procedure Call Back phone  # [phone]  Permission to leave phone message Yes Yes Yes     Patient questions:  Do you have a fever, pain , or abdominal swelling? No. Pain Score  0 *  Have you tolerated food without any problems? Yes.    Have you been able to return to your normal activities? Yes.    Do you have any questions about your discharge instructions: Diet   No. Medications  No. Follow up visit  No.  Do you have questions or concerns about your Care? No.  Actions: * If pain score is 4 or above: No action needed, pain <4.

## 2022-08-23 ENCOUNTER — Encounter: Payer: Self-pay | Admitting: Internal Medicine

## 2022-08-23 DIAGNOSIS — K2270 Barrett's esophagus without dysplasia: Secondary | ICD-10-CM | POA: Insufficient documentation

## 2022-08-27 DIAGNOSIS — M545 Low back pain, unspecified: Secondary | ICD-10-CM | POA: Diagnosis not present

## 2022-08-29 DIAGNOSIS — M545 Low back pain, unspecified: Secondary | ICD-10-CM | POA: Diagnosis not present

## 2022-09-13 DIAGNOSIS — M545 Low back pain, unspecified: Secondary | ICD-10-CM | POA: Diagnosis not present

## 2022-09-17 DIAGNOSIS — M545 Low back pain, unspecified: Secondary | ICD-10-CM | POA: Diagnosis not present

## 2022-09-20 DIAGNOSIS — M545 Low back pain, unspecified: Secondary | ICD-10-CM | POA: Diagnosis not present

## 2022-09-21 DIAGNOSIS — M545 Low back pain, unspecified: Secondary | ICD-10-CM | POA: Diagnosis not present

## 2022-09-24 DIAGNOSIS — M545 Low back pain, unspecified: Secondary | ICD-10-CM | POA: Diagnosis not present

## 2022-10-01 DIAGNOSIS — M545 Low back pain, unspecified: Secondary | ICD-10-CM | POA: Diagnosis not present

## 2022-10-08 DIAGNOSIS — M545 Low back pain, unspecified: Secondary | ICD-10-CM | POA: Diagnosis not present

## 2022-10-15 DIAGNOSIS — M545 Low back pain, unspecified: Secondary | ICD-10-CM | POA: Diagnosis not present

## 2022-10-21 ENCOUNTER — Other Ambulatory Visit: Payer: Self-pay

## 2022-10-21 MED ORDER — PANTOPRAZOLE SODIUM 40 MG PO TBEC: DELAYED_RELEASE_TABLET | ORAL | 3 refills | Status: DC

## 2022-10-22 DIAGNOSIS — M545 Low back pain, unspecified: Secondary | ICD-10-CM | POA: Diagnosis not present

## 2022-10-28 DIAGNOSIS — R310 Gross hematuria: Secondary | ICD-10-CM | POA: Diagnosis not present

## 2022-10-28 DIAGNOSIS — R3915 Urgency of urination: Secondary | ICD-10-CM | POA: Diagnosis not present

## 2022-10-28 DIAGNOSIS — N952 Postmenopausal atrophic vaginitis: Secondary | ICD-10-CM | POA: Diagnosis not present

## 2022-11-26 DIAGNOSIS — N281 Cyst of kidney, acquired: Secondary | ICD-10-CM | POA: Diagnosis not present

## 2022-11-26 DIAGNOSIS — R310 Gross hematuria: Secondary | ICD-10-CM | POA: Diagnosis not present

## 2022-12-02 DIAGNOSIS — R310 Gross hematuria: Secondary | ICD-10-CM | POA: Diagnosis not present

## 2022-12-11 DIAGNOSIS — N950 Postmenopausal bleeding: Secondary | ICD-10-CM | POA: Diagnosis not present

## 2022-12-11 DIAGNOSIS — Z1151 Encounter for screening for human papillomavirus (HPV): Secondary | ICD-10-CM | POA: Diagnosis not present

## 2022-12-11 DIAGNOSIS — Z124 Encounter for screening for malignant neoplasm of cervix: Secondary | ICD-10-CM | POA: Diagnosis not present

## 2022-12-11 DIAGNOSIS — Z6830 Body mass index (BMI) 30.0-30.9, adult: Secondary | ICD-10-CM | POA: Diagnosis not present

## 2023-01-08 DIAGNOSIS — N950 Postmenopausal bleeding: Secondary | ICD-10-CM | POA: Diagnosis not present

## 2023-01-08 DIAGNOSIS — C541 Malignant neoplasm of endometrium: Secondary | ICD-10-CM | POA: Diagnosis not present

## 2023-01-13 ENCOUNTER — Encounter: Payer: Self-pay | Admitting: Internal Medicine

## 2023-01-13 ENCOUNTER — Ambulatory Visit: Payer: Medicare PPO | Admitting: Internal Medicine

## 2023-01-13 VITALS — BP 100/70 | HR 68 | Ht 68.0 in | Wt 199.6 lb

## 2023-01-13 DIAGNOSIS — D352 Benign neoplasm of pituitary gland: Secondary | ICD-10-CM | POA: Diagnosis not present

## 2023-01-13 DIAGNOSIS — E559 Vitamin D deficiency, unspecified: Secondary | ICD-10-CM | POA: Diagnosis not present

## 2023-01-13 DIAGNOSIS — E042 Nontoxic multinodular goiter: Secondary | ICD-10-CM | POA: Diagnosis not present

## 2023-01-13 DIAGNOSIS — E538 Deficiency of other specified B group vitamins: Secondary | ICD-10-CM | POA: Diagnosis not present

## 2023-01-13 NOTE — Patient Instructions (Signed)
  Please stop at the lab.  Please continue off Cabergoline.  Continue vitamin D 2000 IU daily and vitamin B12 1000 mcg daily.  Please return in 1 year.

## 2023-01-13 NOTE — Progress Notes (Unsigned)
Patient ID: Crystal Bauer, female   DOB: 23-May-1955, 67 y.o.   MRN: [id number]  HPI  Crystal Bauer is a 67 y.o.-year-old female, returning for f/u for MNG, microprolactinoma, vitamin B12, and D insufficiencies. Last visit 1 year ago.  Interim history: Patient started to feel better after stopping Cabergoline in 2021.  She had weight loss, no more dizziness, no breast discharge or fulness, no HAs, no vision pbs.   At this visit, she gained approximately 4 pounds, but she has no headaches, blurry vision, no breast tenderness. She had epigastric abdominal pain and had an EGD recently >> GERD with esophagitis. Also hiatal hernia. She also has constipation. Last fall she was found to have blood and proteins in her urine (negative uroculture) >> finally saw a urologist but investigation was negative. A CT scan showed an induration of the uterine wall. She had a D and C >> results pending.  Multinodular goiter: Reviewed history: Patient complained of an enlarging left lower neck mass which was palpated at a visit with her PCP.  Thyroid U/S on 02/04/2013 showed bilateral thyroid nodules, small, less than 5 mm in the right lobe, and 3 larger nodules in the left lobe. The largest lesion is a 1.7 x 1.0 x 1.3 cm cyst situated in the left upper lobe, with post-acoustic shadowing, and colloid particles on the periphery.  Reviewed her TFTs: Lab Results  Component Value Date   TSH 1.090 01/07/2020   TSH 0.92 01/07/2019   TSH 1.42 01/05/2018   TSH 1.530 11/17/2017   TSH 0.92 12/26/2016   TSH 0.78 12/23/2015   TSH 0.63 03/06/2015   TSH 1.10 12/02/2014   TSH 0.39 03/04/2014   TSH 0.63 02/26/2013   FREET4 0.88 01/05/2018   FREET4 0.78 03/06/2015   FREET4 0.87 03/04/2014   FREET4 0.85 02/26/2013    Pt denies: - feeling nodules in neck - hoarseness - dysphagia - choking  Micro-prolactinoma: - dx in 1992 - She was initially on Cabergoline >> stopped in 2000 as her microprolactinoma  shrank. - PRL in ~2004 minimally elevated (Off Cabergoline). - We tried to stay off Cabergoline in 2016, but she started to have L breast milky discharge + breast pain (lump). We checked PRL  >> increased to 122 >> started back on Cabergoline 0.25 mg bid >> levels improved >> decreased to once weekly in 06/2015 due to dizziness and palpitations.   - However, afterwards, we tried again to increase it to twice a week and she did well on this dose, until 2021 when she again started to have palpitations and had an episode of abdominal pain.  She decreased the dose of Cabergoline to 0.25 mg weekly s in 10/2019. -In 2021, we stopped Cabergoline completely  -She declined restarting Cabergoline in 2022 -prolactin level was higher at that time  Reviewed prolactin levels: Lab Results  Component Value Date   PROLACTIN 75.6 (H) 01/11/2022   PROLACTIN 112.3 (H) 01/11/2021   PROLACTIN 55.3 (H) 01/07/2020   PROLACTIN 33.2 (H) 01/07/2019   PROLACTIN 35.7 (H) 01/05/2018   PROLACTIN 38.5 (H) 12/26/2016   PROLACTIN 24.1 12/27/2015   PROLACTIN 13.4 07/07/2015   PROLACTIN 57.1 (H) 03/06/2015   PROLACTIN 122.2 12/02/2014   PROLACTIN 99.8 03/04/2014   PROLACTIN 76.0 02/26/2013   Other pituitary labs were normal: Somatomedin (IGF-I)     31 - 179 ng/mL 119  C206 ACTH     10 - 46 pg/mL 21  Cortisol, Plasma      12.6  FSH      83.9  LH      35.53   MRI (03/09/2014): Pituitary microadenoma measuring 6 mm.  Reviewed vitamin D levels: Lab Results  Component Value Date   VD25OH 31.35 01/11/2022   VD25OH 31.9 01/11/2021   VD25OH 28.4 (L) 01/07/2020   VD25OH 27.35 (L) 01/07/2019   VD25OH 31.67 01/05/2018   VD25OH 36.12 12/26/2016   VD25OH 39.05 02/26/2016   VD25OH 27.08 (L) 12/27/2015   She continues on vitamin D, but she decreased the dose from 5000 to 1000 units daily.  She was on another 800 units a day from protein shakes, but no taking another 1000 units tab daily.  Reviewed vitamin B12  levels: Lab Results  Component Value Date   VITAMINB12 896 01/11/2022   VITAMINB12 831 01/11/2021   VITAMINB12 645 01/07/2020   VITAMINB12 380 01/07/2019   VITAMINB12 881 01/05/2018   VITAMINB12 >1500 (H) 12/26/2016   VITAMINB12 573 02/26/2016   VITAMINB12 254 12/27/2015   She continues on 1000 mcg sl  B12 daily.  She has HL. She did not feel well well on Pravastatin >>  on Red Yeast Rice.  ROS: + See HPI I reviewed pt's medications, allergies, PMH, social hx, family hx, and changes were documented in the history of present illness. Otherwise, unchanged from my initial visit note.  Past Medical History:  Diagnosis Date   Atrial fibrillation (HCC) 05/20/1998   from stress/increased caffeine use   Cataract    GERD (gastroesophageal reflux disease)    Hearing loss 05/20/2010   left ear   History of chicken pox    Hyperplastic colon polyp    Macroprolactinemia    Pituitary adenoma (HCC)    micro   Past Surgical History:  Procedure Laterality Date   CATARACT EXTRACTION     TONSILLECTOMY AND ADENOIDECTOMY  1965   Social History   Socioeconomic History   Marital status: Divorced    Spouse name: Not on file   Number of children: 3   Years of education: 12+   Highest education level: Not on file  Occupational History   Occupation: Teacher, 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  Tobacco Use   Smoking status: Former    Types: E-cigarettes   Smokeless tobacco: Never  Vaping Use   Vaping status: Never Used  Substance and Sexual Activity   Alcohol use: No   Drug use: No   Sexual activity: Not Currently    Birth control/protection: Abstinence  Other Topics Concern   Not on file  Social History Narrative   Regular exercise-noCaffeine Use-yes      6 grands   retired   International aid/development worker of Corporate investment banker Strain: Low Risk  (03/24/2021)   Received from [facility], [facility]   Overall Financial Resource Strain (CARDIA)     Difficulty of Paying Living Expenses: Not hard at all  Food Insecurity: No Food Insecurity (03/24/2021)   Received from [facility], [facility]   Hunger Vital Sign    Worried About Running Out of Food in the Last Year: Never true    Ran Out of Food in the Last Year: Never true  Transportation Needs: No Transportation Needs (03/24/2021)   Received from [facility], [facility]   PRAPARE - Transportation    Lack of Transportation (Medical): No    Lack of Transportation (Non-Medical): No  Physical Activity: Inactive (03/24/2021)   Received from [facility], [facility]  Exercise Vital Sign    Days of Exercise per Week: 0 days    Minutes of Exercise per Session: 0 min  Stress: No Stress Concern Present (03/24/2021)   Received from [facility], [facility] of Occupational Health - Occupational Stress Questionnaire    Feeling of Stress : Not at all  Social Connections: Unknown (09/20/2021)   Received from [facility], [facility]   Social Network    Social Network: Not on file  Intimate Partner Violence: Unknown (08/24/2021)   Received from [facility], [facility]   HITS    Physically Hurt: Not on file    Insult or Talk Down To: Not on file    Threaten Physical Harm: Not on file    Scream or Curse: Not on file   Current Outpatient Medications on File Prior to Visit  Medication Sig Dispense Refill   Calcium Carbonate Antacid (TUMS PO) Take 1,000 mg by mouth as needed.     Cholecalciferol (D3 PO) Take 1,000 mg by mouth daily.     Cyanocobalamin (B-12) 1000 MCG CAPS Take 1,000 Units by mouth daily.     famotidine (PEPCID) 20 MG tablet Take 20 mg by mouth. 1-2 times daily     Magnesium 400 MG CAPS Take 400 mg by mouth daily.     pantoprazole (PROTONIX) 40 MG tablet Increase Pantoprazole to 40 mg twice daily before meals 60 tablet 3   Current Facility-Administered Medications on File Prior to Visit  Medication Dose Route Frequency Provider Last  Rate Last Admin   0.9 %  sodium chloride infusion  500 mL Intravenous Once [name], [name], MD       Allergies  Allergen Reactions   Erythromycin Shortness Of Breath and Anaphylaxis   Procaine     Other reaction(s): Chest Pain palpitations    Quinolones Other (See Comments)    Insomnia  Other reaction(s): Other Insomnia Stay awake all night   Family History  Problem Relation Age of Onset   Cancer Mother        breast, colon   Colon cancer Mother 46   Stroke Mother 74       10/18/2017   Hyperlipidemia Mother    Early death Father    Arthritis Sister    Diabetes Brother        Type 2 Diabetes   Esophageal cancer Maternal Uncle    Diabetes Maternal Grandmother    Hearing loss Maternal Grandmother    Stomach cancer Neg Hx    PE: BP 100/70   Pulse 68   Ht 5\' 8"  (1.727 m)   Wt 199 lb 9.6 oz (90.5 kg)   SpO2 97%   BMI 30.35 kg/m  Wt Readings from Last 3 Encounters:  01/13/23 199 lb 9.6 oz (90.5 kg)  08/20/22 194 lb (88 kg)  06/25/22 194 lb (88 kg)   Constitutional: overweight, in NAD Eyes: no exophthalmos ENT: no thyromegaly but palpable thyroid nodule in left lobe, no cervical lymphadenopathy Cardiovascular: RRR, No RG, +1/6 SEM Respiratory: CTA B Musculoskeletal: no deformities Skin:  no rashes Neurological: no tremor with outstretched hands  ASSESSMENT: 1. MNG - thyroid U/S 02/04/2013: Right thyroid lobe - 4.2 x 1.5 x 1.1 cm. Only small right thyroid nodules are present of no more than 5 mm in diameter.  Left thyroid lobe - 4.9 x 2.1 x 1.9 cm. Multiple left thyroid nodules are present. The largest is complex but primarily cystic in the upper pole of  the left lobe measuring 1.7 x 1.0 x 1.3 cm. Peripheral calcifications are noted within this cystic lesion. (these do appear to be not calcifications but inspissated colloid due to presence of comet tail artifact). Nodules in the lower pole of the left lobe measure 1.2 x 0.7 x 1.1 cm, and 1.0 x 0.8 x 1.8 cm. One of them  is a cyst. Isthmus  -  Thickness: 3 mm in thickness. No nodules visualized.  Lymphadenopathy  - None visualized.  2. History of microprolactinoma - dx 1992 - previously on cabergoline until 2000, when she took herself off it due to palpitations.  We restarted these afterwards and she is currently on 25 mcg twice a week - prolactin check in ~2004 was mildly elevated - has 3 children - she is now postmenopausal - no osteoporosis - MRI (03/09/2014): 1. Findings suspicious for a 6 mm focus of hypo enhancing soft  tissue arising somewhat exophyticly from the caudal aspect of the  gland, suspicious for a microadenoma in this setting. 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  2. Otherwise normal pituitary imaging.  3. Otherwise normal for age MRI appearance of the brain.   3. Vit D insuff  4. Low Vit B12   PLAN: 1. MNG  -Reviewed the results of her latest ultrasound: Left-sided thyroid cyst with coagulated colloid. -She no neck compression symptoms -TFTs were normal at last check Lab Results  Component Value Date   TSH 1.090 01/07/2020  -No further imaging is needed for this, but we will continue to follow her clinically and we will also check a TSH today  2. Microprolactinoma -She has a history of a 6 mm micro prolactinoma with normal pituitary hormones except for high prolactin -In the past, she could not tolerate twice a week Cabergoline because of dizziness and palpitations, however, afterwards, she started to do well with 0.25 mg dose twice a week until right before last visit when she had palpitations and an episode of abdominal pain.  She decreased the dose by herself to 0.25 mg weekly but then stopped it completely, by herself -Her prolactin levels were increasing before, up to 112.  At that time, we discussed about possibly adding back Cabergoline, but she declined.  She felt that after stopping Cabergoline, her dizziness  resolved and she was also able to lose some weight.  Since she is postmenopausal, it was not mandatory to restart Cabergoline but we discussed about doing so if she started to get breast tenderness or headaches.  Since she does not have any of these, we are following her conservatively. -At last visit, vitamin D level was 75.6, elevated, but improved from the previous check -We will recheck the prolactin level today.  If increasing, may need a pituitary MRI.  3. Vit D insuff -No muscle and joint aches -At last visit her vitamin D level was normal, at 31.35 -Currently on vitamin D supplement 2000 units daily -She declined another check today  4. Low Vit B12  -No fatigue, numbness, disequilibrium -She continues 1000 mcg sublingual B12 -At last visit, vitamin B12 level was excellent, at 896 -She declined another check today  Carlus Pavlov, MD PhD [facility]

## 2023-01-14 ENCOUNTER — Encounter: Payer: Self-pay | Admitting: Internal Medicine

## 2023-01-14 LAB — PROLACTIN: Prolactin: 84.3 ng/mL — ABNORMAL HIGH

## 2023-01-14 LAB — TSH: TSH: 0.97 u[IU]/mL (ref 0.35–5.50)

## 2023-01-17 ENCOUNTER — Telehealth: Payer: Self-pay

## 2023-01-17 NOTE — Telephone Encounter (Signed)
Spoke with the patient regarding the referral to GYN oncology. Patient scheduled as new patient with Dr Pricilla Holm on 02/13/2023. Patient given an arrival time of 10:00am  Explained to the patient the the doctor will perform a pelvic exam at this visit. Patient given the policy that only one visitor allowed and that visitor must be over 16 yrs are allowed in the Cancer Center. Patient given the address/phone number for the clinic and that the center offers free valet service. Patient aware that masks are option.

## 2023-02-06 DIAGNOSIS — H26492 Other secondary cataract, left eye: Secondary | ICD-10-CM | POA: Diagnosis not present

## 2023-02-11 ENCOUNTER — Other Ambulatory Visit: Payer: Self-pay

## 2023-02-11 ENCOUNTER — Encounter: Payer: Self-pay | Admitting: Gynecologic Oncology

## 2023-02-11 MED ORDER — PANTOPRAZOLE SODIUM 40 MG PO TBEC: DELAYED_RELEASE_TABLET | ORAL | 3 refills | Status: DC

## 2023-02-12 ENCOUNTER — Encounter: Payer: Self-pay | Admitting: Gynecologic Oncology

## 2023-02-12 NOTE — Progress Notes (Unsigned)
GYNECOLOGIC ONCOLOGY NEW PATIENT CONSULTATION   Patient Name: Crystal Bauer  Patient Age: 67 y.o. 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  We reviewed the nature of endometrial cancer and its recommended surgical staging, including total hysterectomy, bilateral salpingo-oophorectomy, and lymph node assessment. The patient is a suitable candidate for staging via a minimally invasive approach to surgery.  We reviewed that robotic assistance would be used to complete the surgery.   We discussed that most endometrial cancer is detected early and that decisions regarding adjuvant therapy will be made based on her final pathology.   We reviewed the sentinel lymph node technique. Risks and benefits of sentinel lymph node biopsy was reviewed. We reviewed the technique and ICG dye. The patient DOES NOT have an iodine allergy or known liver dysfunction. We reviewed the false negative rate (0.4%), and that 3% of patients with metastatic disease will not have it detected by SLN biopsy in endometrial cancer. A low risk of allergic reaction to the dye, <0.2% for ICG, has been reported. We also discussed that in the case of failed mapping, which occurs 40% of the time, a bilateral or unilateral lymphadenectomy will be performed at the surgeon's discretion.   Potential benefits of sentinel nodes including a higher detection rate for metastasis due to ultrastaging and potential reduction in operative morbidity. However, there remains uncertainty as to the role for treatment of micrometastatic disease. Further, the benefit of operative morbidity associated with the SLN technique in endometrial cancer is not yet completely known. In other patient populations (e.g. the cervical cancer  population) there has been observed reductions in morbidity with SLN biopsy compared to pelvic lymphadenectomy. Lymphedema, nerve dysfunction and lymphocysts are all potential risks with the SLN technique as with complete lymphadenectomy. Additional risks to the patient include the risk of damage to an internal organ while operating in an altered view (e.g. the black and white image of the robotic fluorescence imaging mode).   We discussed the plan for a robotic assisted hysterectomy, bilateral salpingo-oophorectomy, sentinel lymph node evaluation, possible lymph node dissection, possible laparotomy. The risks of surgery were discussed in detail and she understands these to include infection; wound separation; hernia; vaginal cuff separation, injury to adjacent organs such as bowel, bladder, blood vessels, ureters and nerves; bleeding which may require blood transfusion; anesthesia risk; thromboembolic events; possible death; unforeseen complications; possible need for re-exploration; medical complications such as heart attack, stroke, pleural effusion and pneumonia; and, if full lymphadenectomy is performed the risk of lymphedema and lymphocyst. The patient will receive DVT and antibiotic prophylaxis as indicated. She voiced a clear understanding. She had the opportunity to ask questions. Perioperative instructions were reviewed with her. Prescriptions for post-op medications were sent to her pharmacy of choice.  Given her history of pituitary adenoma, will reach out to her primary care provider for preoperative clearance.  Although she denies any systemic symptoms, she has fairly significant cervical motion tenderness.  In the setting of her recent biopsy, discussed modified treatment for pelvic inflammatory disease.  Will plan on 1 week of doxycycline and Flagyl.  A copy of this note was sent to the patient's referring provider.   70 minutes of total time was spent for this patient encounter, including  preparation, face-to-face counseling with the patient and coordination of care, and documentation of the encounter.  Eugene Garnet, MD  Division of Gynecologic Oncology  Department of Obstetrics and Gynecology  [facility]  ___________________________________________  Chief Complaint: Chief Complaint  Patient presents with   Endometrial carcinoma [facility])    History of Present Illness:  Crystal Bauer is a 67 y.o. y.o. female who is seen in consultation at the request of Dr. Elon Spanner for an evaluation of endometrial cancer.  She was seen initially in July with PMB. She was undergoing work-up with Urology for hematuria, CT showed endometrial thickening.  She underwent saline infusion sonogram on 8/21 confirming thickened endometrium.   Biopsy was performed on 01/08/23 and pathology revealed endometrioid adenocarcinoma, FIGO grade 1.  The patient comes in with her daughter today.  She notes initially seeing urology in November of last year after starting to feel like she was retaining urine or having trouble emptying her bladder fully.  She ultimately underwent cystoscopy and a CT scan in July.  CT scan was notable for expansile, hypodense appearance of the endometrial cavity measuring up to 4.3 cm.  No adenopathy or evidence of metastatic disease.  A follow-up saline infused sonogram was performed with her OB/GYN and notable for a possible 4 cm polypoid mass within the endometrial cavity.  The patient notes that her bleeding started in June while working with physical therapy.  She initially felt a pain in her groin and then when she got home past blood clot.  She has had periodic spotting since, most of the time not requiring a pad.  She denies any recent cramping.  She endorses periodic constipation that has developed within the last 6 months.  Uses MiraLAX as needed.  Denies any recent weight changes.  PAST MEDICAL HISTORY:  Past Medical History:  Diagnosis  Date   Atrial fibrillation (HCC) 05/20/1998   from stress/increased caffeine use, resolved   Cataract    GERD (gastroesophageal reflux disease)    Hearing loss 05/20/2010   left ear   History of chicken pox    Hyperplastic colon polyp    Macroprolactinemia    Orthostatic hypotension    Pituitary adenoma (HCC)    micro     PAST SURGICAL HISTORY:  Past Surgical History:  Procedure Laterality Date   CATARACT EXTRACTION     TONSILLECTOMY AND ADENOIDECTOMY  1965    OB/GYN HISTORY:  OB History  Gravida Para Term Preterm AB Living  3 3          SAB IAB Ectopic Multiple Live Births               # Outcome Date GA Lbr Len/2nd Weight Sex Type Anes PTL Lv  3 Para           2 Para           1 Para             No LMP recorded. Patient is postmenopausal.  Age at menarche: 50  Age at menopause: 63 Hx of HRT: denies Hx of STDs: denies Last pap: 11/2022 History of abnormal pap smears: no  SCREENING STUDIES:  Last mammogram: 2024  Last colonoscopy: 2014  MEDICATIONS: Outpatient Encounter Medications as of 02/13/2023  Medication Sig   Cholecalciferol (D3 PO) Take 1,000 mg by mouth daily.   Cyanocobalamin (B-12) 1000 MCG CAPS Take 1,000 Units by mouth daily.   doxycycline (VIBRA-TABS) 100 MG tablet Take 1 tablet (100 mg total) by mouth 2 (two) times daily for 7 days.   Magnesium 400 MG  CAPS Take 400 mg by mouth daily.   metroNIDAZOLE (FLAGYL) 500 MG tablet Take 1 tablet (500 mg total) by mouth 2 (two) times daily for 7 days.   senna-docusate (SENOKOT-S) 8.6-50 MG tablet Take 2 tablets by mouth at bedtime. For AFTER surgery, do not take if having diarrhea   traMADol (ULTRAM) 50 MG tablet Take 1 tablet (50 mg total) by mouth every 6 (six) hours as needed for severe pain. For AFTER surgery only, do not take and drive   [DISCONTINUED] pantoprazole (PROTONIX) 40 MG tablet Increase Pantoprazole to 40 mg twice daily before meals   [DISCONTINUED] Calcium Carbonate Antacid (TUMS PO) Take  1,000 mg by mouth as needed.   [DISCONTINUED] famotidine (PEPCID) 20 MG tablet Take 20 mg by mouth. 1-2 times daily   Facility-Administered Encounter Medications as of 02/13/2023  Medication   0.9 %  sodium chloride infusion    ALLERGIES:  Allergies  Allergen Reactions   Erythromycin Shortness Of Breath and Anaphylaxis   Procaine     Other reaction(s): Chest Pain palpitations    Quinolones Other (See Comments)    Insomnia  Other reaction(s): Other Insomnia Stay awake all night     FAMILY HISTORY:  Family History  Problem Relation Age of Onset   Cancer Mother        breast, colon   Colon cancer Mother 50   Stroke Mother 53       10/18/2017   Hyperlipidemia Mother    Breast cancer Mother    Early death Father    Arthritis Sister    Diabetes Brother        Type 2 Diabetes   Esophageal cancer Maternal Uncle    Diabetes Maternal Grandmother    Hearing loss Maternal Grandmother    Uterine cancer Maternal Grandmother    Stomach cancer Neg Hx      SOCIAL HISTORY:  Social Connections: Unknown (09/20/2021)   Received from [facility], [facility]   Social Network    Social Network: Not on file    REVIEW OF SYSTEMS:  Denies appetite changes, fevers, chills, fatigue, unexplained weight changes. Denies hearing loss, neck lumps or masses, mouth sores, ringing in ears or voice changes. Denies cough or wheezing.  Denies shortness of breath. Denies chest pain or palpitations. Denies leg swelling. Denies abdominal distention, pain, blood in stools, constipation, diarrhea, nausea, vomiting, or early satiety. Denies pain with intercourse, dysuria, frequency, hematuria or incontinence. Denies hot flashes, pelvic pain, vaginal bleeding or vaginal discharge.   Denies joint pain, back pain or muscle pain/cramps. Denies itching, rash, or wounds. Denies dizziness, headaches, numbness or seizures. Denies swollen lymph nodes or glands, denies easy bruising or bleeding. Denies  anxiety, depression, confusion, or decreased concentration.  Physical Exam:  Vital Signs for this encounter:  Blood pressure 106/69, pulse 60, temperature 98.4 F (36.9 C), temperature source Oral, resp. rate 16, height 5\' 8"  (1.727 m), weight 197 lb 12.8 oz (89.7 kg), SpO2 98%. Body mass index is 30.08 kg/m. General: Alert, oriented, no acute distress.  HEENT: Normocephalic, atraumatic. Sclera anicteric.  Chest: Clear to auscultation bilaterally. No wheezes, rhonchi, or rales. Cardiovascular: Regular rate and rhythm, no murmurs, rubs, or gallops.  Abdomen: Normoactive bowel sounds. Soft, nondistended, nontender to palpation. No masses or hepatosplenomegaly appreciated. No palpable fluid wave.  Extremities: Grossly normal range of motion. Warm, well perfused. No edema bilaterally.  Skin: No rashes or lesions.  Lymphatics: No cervical, supraclavicular, or inguinal adenopathy.  GU:  Normal external female  genitalia. No lesions. No discharge or bleeding.             Bladder/urethra:  No lesions or masses, well supported bladder             Vagina: Mildly atrophic, no lesions noted.             Cervix: Normal appearing, no lesions.  No erythema.             Uterus: Small, mobile, no parametrial involvement or nodularity.  Patient has at least moderate tenderness with palpation of her cervix concerning for cervical motion tenderness.             Adnexa: No masses palpated.  Rectal: Deferred.  LABORATORY AND RADIOLOGIC DATA:  Outside medical records were reviewed to synthesize the above history, along with the history and physical obtained during the visit.   Lab Results  Component Value Date   WBC 8.3 11/22/2019   HGB 14.7 11/22/2019   HCT 44.3 11/22/2019   PLT 299 11/22/2019   GLUCOSE 91 04/09/2022   CHOL 210 (H) 04/09/2022   TRIG 110.0 04/09/2022   HDL 58.90 04/09/2022   LDLDIRECT 142.5 10/01/2012   LDLCALC 130 (H) 04/09/2022   ALT 29 11/17/2017   AST 22 11/17/2017   NA 142  11/22/2019   K 4.3 11/22/2019   CL 107 11/22/2019   CREATININE 0.93 11/22/2019   BUN 15 11/22/2019   CO2 26 11/22/2019   TSH 0.97 01/13/2023   HGBA1C 5.8 04/09/2022

## 2023-02-12 NOTE — H&P (View-Only) (Signed)
GYNECOLOGIC ONCOLOGY NEW PATIENT CONSULTATION   Patient Name: Crystal Bauer  Patient Age: 67 y.o. 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  We reviewed the nature of endometrial cancer and its recommended surgical staging, including total hysterectomy, bilateral salpingo-oophorectomy, and lymph node assessment. The patient is a suitable candidate for staging via a minimally invasive approach to surgery.  We reviewed that robotic assistance would be used to complete the surgery.   We discussed that most endometrial cancer is detected early and that decisions regarding adjuvant therapy will be made based on her final pathology.   We reviewed the sentinel lymph node technique. Risks and benefits of sentinel lymph node biopsy was reviewed. We reviewed the technique and ICG dye. The patient DOES NOT have an iodine allergy or known liver dysfunction. We reviewed the false negative rate (0.4%), and that 3% of patients with metastatic disease will not have it detected by SLN biopsy in endometrial cancer. A low risk of allergic reaction to the dye, <0.2% for ICG, has been reported. We also discussed that in the case of failed mapping, which occurs 40% of the time, a bilateral or unilateral lymphadenectomy will be performed at the surgeon's discretion.   Potential benefits of sentinel nodes including a higher detection rate for metastasis due to ultrastaging and potential reduction in operative morbidity. However, there remains uncertainty as to the role for treatment of micrometastatic disease. Further, the benefit of operative morbidity associated with the SLN technique in endometrial cancer is not yet completely known. In other patient populations (e.g. the cervical cancer  population) there has been observed reductions in morbidity with SLN biopsy compared to pelvic lymphadenectomy. Lymphedema, nerve dysfunction and lymphocysts are all potential risks with the SLN technique as with complete lymphadenectomy. Additional risks to the patient include the risk of damage to an internal organ while operating in an altered view (e.g. the black and white image of the robotic fluorescence imaging mode).   We discussed the plan for a robotic assisted hysterectomy, bilateral salpingo-oophorectomy, sentinel lymph node evaluation, possible lymph node dissection, possible laparotomy. The risks of surgery were discussed in detail and she understands these to include infection; wound separation; hernia; vaginal cuff separation, injury to adjacent organs such as bowel, bladder, blood vessels, ureters and nerves; bleeding which may require blood transfusion; anesthesia risk; thromboembolic events; possible death; unforeseen complications; possible need for re-exploration; medical complications such as heart attack, stroke, pleural effusion and pneumonia; and, if full lymphadenectomy is performed the risk of lymphedema and lymphocyst. The patient will receive DVT and antibiotic prophylaxis as indicated. She voiced a clear understanding. She had the opportunity to ask questions. Perioperative instructions were reviewed with her. Prescriptions for post-op medications were sent to her pharmacy of choice.  Given her history of pituitary adenoma, will reach out to her primary care provider for preoperative clearance.  Although she denies any systemic symptoms, she has fairly significant cervical motion tenderness.  In the setting of her recent biopsy, discussed modified treatment for pelvic inflammatory disease.  Will plan on 1 week of doxycycline and Flagyl.  A copy of this note was sent to the patient's referring provider.   70 minutes of total time was spent for this patient encounter, including  preparation, face-to-face counseling with the patient and coordination of care, and documentation of the encounter.  Eugene Garnet, MD  Division of Gynecologic Oncology  Department of Obstetrics and Gynecology  [facility]  ___________________________________________  Chief Complaint: Chief Complaint  Patient presents with   Endometrial carcinoma [facility])    History of Present Illness:  Crystal Bauer is a 67 y.o. y.o. female who is seen in consultation at the request of Dr. Elon Spanner for an evaluation of endometrial cancer.  She was seen initially in July with PMB. She was undergoing work-up with Urology for hematuria, CT showed endometrial thickening.  She underwent saline infusion sonogram on 8/21 confirming thickened endometrium.   Biopsy was performed on 01/08/23 and pathology revealed endometrioid adenocarcinoma, FIGO grade 1.  The patient comes in with her daughter today.  She notes initially seeing urology in November of last year after starting to feel like she was retaining urine or having trouble emptying her bladder fully.  She ultimately underwent cystoscopy and a CT scan in July.  CT scan was notable for expansile, hypodense appearance of the endometrial cavity measuring up to 4.3 cm.  No adenopathy or evidence of metastatic disease.  A follow-up saline infused sonogram was performed with her OB/GYN and notable for a possible 4 cm polypoid mass within the endometrial cavity.  The patient notes that her bleeding started in June while working with physical therapy.  She initially felt a pain in her groin and then when she got home past blood clot.  She has had periodic spotting since, most of the time not requiring a pad.  She denies any recent cramping.  She endorses periodic constipation that has developed within the last 6 months.  Uses MiraLAX as needed.  Denies any recent weight changes.  PAST MEDICAL HISTORY:  Past Medical History:  Diagnosis  Date   Atrial fibrillation (HCC) 05/20/1998   from stress/increased caffeine use, resolved   Cataract    GERD (gastroesophageal reflux disease)    Hearing loss 05/20/2010   left ear   History of chicken pox    Hyperplastic colon polyp    Macroprolactinemia    Orthostatic hypotension    Pituitary adenoma (HCC)    micro     PAST SURGICAL HISTORY:  Past Surgical History:  Procedure Laterality Date   CATARACT EXTRACTION     TONSILLECTOMY AND ADENOIDECTOMY  1965    OB/GYN HISTORY:  OB History  Gravida Para Term Preterm AB Living  3 3          SAB IAB Ectopic Multiple Live Births               # Outcome Date GA Lbr Len/2nd Weight Sex Type Anes PTL Lv  3 Para           2 Para           1 Para             No LMP recorded. Patient is postmenopausal.  Age at menarche: 24  Age at menopause: 5 Hx of HRT: denies Hx of STDs: denies Last pap: 11/2022 History of abnormal pap smears: no  SCREENING STUDIES:  Last mammogram: 2024  Last colonoscopy: 2014  MEDICATIONS: Outpatient Encounter Medications as of 02/13/2023  Medication Sig   Cholecalciferol (D3 PO) Take 1,000 mg by mouth daily.   Cyanocobalamin (B-12) 1000 MCG CAPS Take 1,000 Units by mouth daily.   doxycycline (VIBRA-TABS) 100 MG tablet Take 1 tablet (100 mg total) by mouth 2 (two) times daily for 7 days.   Magnesium 400 MG  CAPS Take 400 mg by mouth daily.   metroNIDAZOLE (FLAGYL) 500 MG tablet Take 1 tablet (500 mg total) by mouth 2 (two) times daily for 7 days.   senna-docusate (SENOKOT-S) 8.6-50 MG tablet Take 2 tablets by mouth at bedtime. For AFTER surgery, do not take if having diarrhea   traMADol (ULTRAM) 50 MG tablet Take 1 tablet (50 mg total) by mouth every 6 (six) hours as needed for severe pain. For AFTER surgery only, do not take and drive   [DISCONTINUED] pantoprazole (PROTONIX) 40 MG tablet Increase Pantoprazole to 40 mg twice daily before meals   [DISCONTINUED] Calcium Carbonate Antacid (TUMS PO) Take  1,000 mg by mouth as needed.   [DISCONTINUED] famotidine (PEPCID) 20 MG tablet Take 20 mg by mouth. 1-2 times daily   Facility-Administered Encounter Medications as of 02/13/2023  Medication   0.9 %  sodium chloride infusion    ALLERGIES:  Allergies  Allergen Reactions   Erythromycin Shortness Of Breath and Anaphylaxis   Procaine     Other reaction(s): Chest Pain palpitations    Quinolones Other (See Comments)    Insomnia  Other reaction(s): Other Insomnia Stay awake all night     FAMILY HISTORY:  Family History  Problem Relation Age of Onset   Cancer Mother        breast, colon   Colon cancer Mother 66   Stroke Mother 44       10/18/2017   Hyperlipidemia Mother    Breast cancer Mother    Early death Father    Arthritis Sister    Diabetes Brother        Type 2 Diabetes   Esophageal cancer Maternal Uncle    Diabetes Maternal Grandmother    Hearing loss Maternal Grandmother    Uterine cancer Maternal Grandmother    Stomach cancer Neg Hx      SOCIAL HISTORY:  Social Connections: Unknown (09/20/2021)   Received from [facility], [facility]   Social Network    Social Network: Not on file    REVIEW OF SYSTEMS:  Denies appetite changes, fevers, chills, fatigue, unexplained weight changes. Denies hearing loss, neck lumps or masses, mouth sores, ringing in ears or voice changes. Denies cough or wheezing.  Denies shortness of breath. Denies chest pain or palpitations. Denies leg swelling. Denies abdominal distention, pain, blood in stools, constipation, diarrhea, nausea, vomiting, or early satiety. Denies pain with intercourse, dysuria, frequency, hematuria or incontinence. Denies hot flashes, pelvic pain, vaginal bleeding or vaginal discharge.   Denies joint pain, back pain or muscle pain/cramps. Denies itching, rash, or wounds. Denies dizziness, headaches, numbness or seizures. Denies swollen lymph nodes or glands, denies easy bruising or bleeding. Denies  anxiety, depression, confusion, or decreased concentration.  Physical Exam:  Vital Signs for this encounter:  Blood pressure 106/69, pulse 60, temperature 98.4 F (36.9 C), temperature source Oral, resp. rate 16, height 5\' 8"  (1.727 m), weight 197 lb 12.8 oz (89.7 kg), SpO2 98%. Body mass index is 30.08 kg/m. General: Alert, oriented, no acute distress.  HEENT: Normocephalic, atraumatic. Sclera anicteric.  Chest: Clear to auscultation bilaterally. No wheezes, rhonchi, or rales. Cardiovascular: Regular rate and rhythm, no murmurs, rubs, or gallops.  Abdomen: Normoactive bowel sounds. Soft, nondistended, nontender to palpation. No masses or hepatosplenomegaly appreciated. No palpable fluid wave.  Extremities: Grossly normal range of motion. Warm, well perfused. No edema bilaterally.  Skin: No rashes or lesions.  Lymphatics: No cervical, supraclavicular, or inguinal adenopathy.  GU:  Normal external female  genitalia. No lesions. No discharge or bleeding.             Bladder/urethra:  No lesions or masses, well supported bladder             Vagina: Mildly atrophic, no lesions noted.             Cervix: Normal appearing, no lesions.  No erythema.             Uterus: Small, mobile, no parametrial involvement or nodularity.  Patient has at least moderate tenderness with palpation of her cervix concerning for cervical motion tenderness.             Adnexa: No masses palpated.  Rectal: Deferred.  LABORATORY AND RADIOLOGIC DATA:  Outside medical records were reviewed to synthesize the above history, along with the history and physical obtained during the visit.   Lab Results  Component Value Date   WBC 8.3 11/22/2019   HGB 14.7 11/22/2019   HCT 44.3 11/22/2019   PLT 299 11/22/2019   GLUCOSE 91 04/09/2022   CHOL 210 (H) 04/09/2022   TRIG 110.0 04/09/2022   HDL 58.90 04/09/2022   LDLDIRECT 142.5 10/01/2012   LDLCALC 130 (H) 04/09/2022   ALT 29 11/17/2017   AST 22 11/17/2017   NA 142  11/22/2019   K 4.3 11/22/2019   CL 107 11/22/2019   CREATININE 0.93 11/22/2019   BUN 15 11/22/2019   CO2 26 11/22/2019   TSH 0.97 01/13/2023   HGBA1C 5.8 04/09/2022

## 2023-02-13 ENCOUNTER — Inpatient Hospital Stay: Payer: Medicare PPO | Attending: Gynecologic Oncology | Admitting: Gynecologic Oncology

## 2023-02-13 ENCOUNTER — Encounter: Payer: Self-pay | Admitting: Gynecologic Oncology

## 2023-02-13 ENCOUNTER — Inpatient Hospital Stay (HOSPITAL_BASED_OUTPATIENT_CLINIC_OR_DEPARTMENT_OTHER): Payer: Medicare PPO | Admitting: Gynecologic Oncology

## 2023-02-13 VITALS — BP 106/69 | HR 60 | Temp 98.4°F | Resp 16 | Ht 68.0 in | Wt 197.8 lb

## 2023-02-13 DIAGNOSIS — Z800 Family history of malignant neoplasm of digestive organs: Secondary | ICD-10-CM | POA: Diagnosis not present

## 2023-02-13 DIAGNOSIS — Z803 Family history of malignant neoplasm of breast: Secondary | ICD-10-CM

## 2023-02-13 DIAGNOSIS — N739 Female pelvic inflammatory disease, unspecified: Secondary | ICD-10-CM | POA: Diagnosis not present

## 2023-02-13 DIAGNOSIS — D352 Benign neoplasm of pituitary gland: Secondary | ICD-10-CM | POA: Diagnosis not present

## 2023-02-13 DIAGNOSIS — Z8049 Family history of malignant neoplasm of other genital organs: Secondary | ICD-10-CM | POA: Diagnosis not present

## 2023-02-13 DIAGNOSIS — N950 Postmenopausal bleeding: Secondary | ICD-10-CM | POA: Diagnosis not present

## 2023-02-13 DIAGNOSIS — C541 Malignant neoplasm of endometrium: Secondary | ICD-10-CM | POA: Diagnosis not present

## 2023-02-13 DIAGNOSIS — N949 Unspecified condition associated with female genital organs and menstrual cycle: Secondary | ICD-10-CM

## 2023-02-13 MED ORDER — SENNOSIDES-DOCUSATE SODIUM 8.6-50 MG PO TABS
2.0000 | ORAL_TABLET | Freq: Every day | ORAL | 0 refills | Status: DC
Start: 2023-02-13 — End: 2023-06-16

## 2023-02-13 MED ORDER — METRONIDAZOLE 500 MG PO TABS
500.0000 mg | ORAL_TABLET | Freq: Two times a day (BID) | ORAL | 0 refills | Status: AC
Start: 2023-02-13 — End: 2023-02-20

## 2023-02-13 MED ORDER — TRAMADOL HCL 50 MG PO TABS
50.0000 mg | ORAL_TABLET | Freq: Four times a day (QID) | ORAL | 0 refills | Status: DC | PRN
Start: 2023-02-13 — End: 2023-03-18

## 2023-02-13 MED ORDER — DOXYCYCLINE HYCLATE 100 MG PO TABS
100.0000 mg | ORAL_TABLET | Freq: Two times a day (BID) | ORAL | 0 refills | Status: AC
Start: 2023-02-13 — End: 2023-02-20

## 2023-02-13 NOTE — Patient Instructions (Addendum)
Plan on starting to take the antibiotics, doxycycline and flagyl, for one week. With the doxycycline, you will need to avoid exposure to direct sunlight for longer periods of time. With the flagyl, you will need to avoid alcohol since the two together can make you sick.  We have also placed a referral for you to meet with the genetics counselor. This does not need to happen before surgery.  Preparing for your Surgery  Plan for surgery on February 26, 2023 with Dr. Eugene Garnet at [facility]. You will be scheduled for robotic assisted total laparoscopic hysterectomy (removal of the uterus and cervix), bilateral salpingo-oophorectomy (removal of both ovaries and fallopian tubes), sentinel lymph node biopsy, possible lymph node dissection, possible laparotomy (larger incision on your abdomen if needed).   Pre-operative Testing -You will receive a phone call from presurgical testing at [facility] to arrange for a pre-operative appointment and lab work.  -Bring your insurance card, copy of an advanced directive if applicable, medication list  -At that visit, you will be asked to sign a consent for a possible blood transfusion in case a transfusion becomes necessary during surgery.  The need for a blood transfusion is rare but having consent is a necessary part of your care.     -You should not be taking blood thinners or aspirin at least ten days prior to surgery unless instructed by your surgeon.  -Do not take supplements such as fish oil (omega 3), red yeast rice, turmeric before your surgery. You want to avoid medications with aspirin in them including headache powders such as BC or Goody's), Excedrin migraine.  Day Before Surgery at Home -You will be asked to take in a light diet the day before surgery. You will be advised you can have clear liquids up until 3 hours before your surgery.    Eat a light diet the day before surgery.  Examples including soups, broths, toast,  yogurt, mashed potatoes.  AVOID GAS PRODUCING FOODS AND BEVERAGES. Things to avoid include carbonated beverages (fizzy beverages, sodas), raw fruits and raw vegetables (uncooked), or beans.   If your bowels are filled with gas, your surgeon will have difficulty visualizing your pelvic organs which increases your surgical risks.  Your role in recovery Your role is to become active as soon as directed by your doctor, while still giving yourself time to heal.  Rest when you feel tired. You will be asked to do the following in order to speed your recovery:  - Cough and breathe deeply. This helps to clear and expand your lungs and can prevent pneumonia after surgery.  - STAY ACTIVE WHEN YOU GET HOME. Do mild physical activity. Walking or moving your legs help your circulation and body functions return to normal. Do not try to get up or walk alone the first time after surgery.   -If you develop swelling on one leg or the other, pain in the back of your leg, redness/warmth in one of your legs, please call the office or go to the Emergency Room to have a doppler to rule out a blood clot. For shortness of breath, chest pain-seek care in the Emergency Room as soon as possible. - Actively manage your pain. Managing your pain lets you move in comfort. We will ask you to rate your pain on a scale of zero to 10. It is your responsibility to tell your doctor or nurse where and how much you hurt so your pain can be treated.  Special  Considerations -If you are diabetic, you may be placed on insulin after surgery to have closer control over your blood sugars to promote healing and recovery.  This does not mean that you will be discharged on insulin.  If applicable, your oral antidiabetics will be resumed when you are tolerating a solid diet.  -Your final pathology results from surgery should be available around one week after surgery and the results will be relayed to you when available.  -FMLA forms can be faxed to  [phone] and please allow 5-7 business days for completion.  Pain Management After Surgery -You have been prescribed your pain medication and bowel regimen medications before surgery so that you can have these available when you are discharged from the hospital. The pain medication is for use ONLY AFTER surgery and a new prescription will not be given.   -Make sure that you have Tylenol and Ibuprofen IF YOU ARE ABLE TO TAKE THESE MEDICATIONS at home to use on a regular basis after surgery for pain control. We recommend alternating the medications every hour to six hours since they work differently and are processed in the body differently for pain relief.  -Review the attached handout on narcotic use and their risks and side effects.   Bowel Regimen -You have been prescribed Sennakot-S to take nightly to prevent constipation especially if you are taking the narcotic pain medication intermittently.  It is important to prevent constipation and drink adequate amounts of liquids. You can stop taking this medication when you are not taking pain medication and you are back on your normal bowel routine.  Risks of Surgery Risks of surgery are low but include bleeding, infection, damage to surrounding structures, re-operation, blood clots, and very rarely death.   Blood Transfusion Information (For the consent to be signed before surgery)  We will be checking your blood type before surgery so in case of emergencies, we will know what type of blood you would need.                                            WHAT IS A BLOOD TRANSFUSION?  A transfusion is the replacement of blood or some of its parts. Blood is made up of multiple cells which provide different functions. Red blood cells carry oxygen and are used for blood loss replacement. White blood cells fight against infection. Platelets control bleeding. Plasma helps clot blood. Other blood products are available for specialized needs, such as  hemophilia or other clotting disorders. BEFORE THE TRANSFUSION  Who gives blood for transfusions?  You may be able to donate blood to be used at a later date on yourself (autologous donation). Relatives can be asked to donate blood. This is generally not any safer than if you have received blood from a stranger. The same precautions are taken to ensure safety when a relative's blood is donated. Healthy volunteers who are fully evaluated to make sure their blood is safe. This is blood bank blood. Transfusion therapy is the safest it has ever been in the practice of medicine. Before blood is taken from a donor, a complete history is taken to make sure that person has no history of diseases nor engages in risky social behavior (examples are intravenous drug use or sexual activity with multiple partners). The donor's travel history is screened to minimize risk of transmitting infections, such as malaria. The  donated blood is tested for signs of infectious diseases, such as HIV and hepatitis. The blood is then tested to be sure it is compatible with you in order to minimize the chance of a transfusion reaction. If you or a relative donates blood, this is often done in anticipation of surgery and is not appropriate for emergency situations. It takes many days to process the donated blood. RISKS AND COMPLICATIONS Although transfusion therapy is very safe and saves many lives, the main dangers of transfusion include:  Getting an infectious disease. Developing a transfusion reaction. This is an allergic reaction to something in the blood you were given. Every precaution is taken to prevent this. The decision to have a blood transfusion has been considered carefully by your caregiver before blood is given. Blood is not given unless the benefits outweigh the risks.  AFTER SURGERY INSTRUCTIONS  Return to work: 4-6 weeks if applicable  Activity: 1. Be up and out of the bed during the day.  Take a nap if needed.   You may walk up steps but be careful and use the hand rail.  Stair climbing will tire you more than you think, you may need to stop part way and rest.   2. No lifting or straining for 6 weeks over 10 pounds. No pushing, pulling, straining for 6 weeks.  3. No driving for around 1 week(s).  Do not drive if you are taking narcotic pain medicine and make sure that your reaction time has returned.   4. You can shower as soon as the next day after surgery. Shower daily.  Use your regular soap and water (not directly on the incision) and pat your incision(s) dry afterwards; don't rub.  No tub baths or submerging your body in water until cleared by your surgeon. If you have the soap that was given to you by pre-surgical testing that was used before surgery, you do not need to use it afterwards because this can irritate your incisions.   5. No sexual activity and nothing in the vagina for 8-10 weeks.  6. You may experience a small amount of clear drainage from your incisions, which is normal.  If the drainage persists, increases, or changes color please call the office.  7. Do not use creams, lotions, or ointments such as neosporin on your incisions after surgery until advised by your surgeon because they can cause removal of the dermabond glue on your incisions.    8. You may experience vaginal spotting after surgery or when the stitches at the top of the vagina begin to dissolve.  The spotting is normal but if you experience heavy bleeding, call our office.  9. Take Tylenol or ibuprofen first for pain if you are able to take these medications and only use narcotic pain medication for severe pain not relieved by the Tylenol or Ibuprofen.  Monitor your Tylenol intake to a max of 4,000 mg in a 24 hour period. You can alternate these medications after surgery.  Diet: 1. Low sodium Heart Healthy Diet is recommended but you are cleared to resume your normal (before surgery) diet after your procedure.  2. It  is safe to use a laxative, such as Miralax or Colace, if you have difficulty moving your bowels. You have been prescribed Sennakot-S to take at bedtime every evening after surgery to keep bowel movements regular and to prevent constipation.    Wound Care: 1. Keep clean and dry.  Shower daily.  Reasons to call the Doctor: Fever -  Oral temperature greater than 100.4 degrees Fahrenheit Foul-smelling vaginal discharge Difficulty urinating Nausea and vomiting Increased pain at the site of the incision that is unrelieved with pain medicine. Difficulty breathing with or without chest pain New calf pain especially if only on one side Sudden, continuing increased vaginal bleeding with or without clots.   Contacts: For questions or concerns you should contact:  Dr. Eugene Garnet at [phone]  Warner Mccreedy, NP at [phone]  After Hours: call [phone] and have the GYN Oncologist paged/contacted (after 5 pm or on the weekends). You will speak with an after hours RN and let he or she know you have had surgery.  Messages sent via mychart are for non-urgent matters and are not responded to after hours so for urgent needs, please call the after hours number.

## 2023-02-14 NOTE — Progress Notes (Signed)
Patient here for new patient consultation with Dr. Pricilla Holm and for a pre-operative appointment prior to her scheduled surgery on February 26, 2023. She is scheduled for robotic assisted total laparoscopic hysterectomy, bilateral salpingo-oophorectomy, sentinel lymph node biopsy, possible lymph node dissection, possible laparotomy. The surgery was discussed in detail.  See after visit summary for additional details.    Discussed post-op pain management in detail including the aspects of the enhanced recovery pathway.  Advised her that a new prescription would be sent in for tramadol and it is only to be used for after her upcoming surgery.  We discussed the use of tylenol post-op and to monitor for a maximum of 4,000 mg in a 24 hour period.  Also prescribed sennakot to be used after surgery and to hold if having loose stools.  Discussed bowel regimen in detail.     Discussed the use of SCDs and measures to take at home to prevent DVT including frequent mobility.  Reportable signs and symptoms of DVT discussed. Post-operative instructions discussed and expectations for after surgery. Incisional care discussed as well including reportable signs and symptoms including erythema, drainage, wound separation.     10 minutes spent preparing information and with the patient.  Verbalizing understanding of material discussed. No needs or concerns voiced at the end of the visit.   Advised patient to call for any needs.  Advised that her post-operative medications had been prescribed and could be picked up at any time.    This appointment is included in the global surgical bundle as pre-operative teaching and has no charge.

## 2023-02-14 NOTE — Patient Instructions (Signed)
Plan on starting to take the antibiotics, doxycycline and flagyl, for one week. With the doxycycline, you will need to avoid exposure to direct sunlight for longer periods of time. With the flagyl, you will need to avoid alcohol since the two together can make you sick.  We have also placed a referral for you to meet with the genetics counselor. This does not need to happen before surgery.  Preparing for your Surgery  Plan for surgery on February 26, 2023 with Dr. Eugene Garnet at [facility]. You will be scheduled for robotic assisted total laparoscopic hysterectomy (removal of the uterus and cervix), bilateral salpingo-oophorectomy (removal of both ovaries and fallopian tubes), sentinel lymph node biopsy, possible lymph node dissection, possible laparotomy (larger incision on your abdomen if needed).   Pre-operative Testing -You will receive a phone call from presurgical testing at [facility] to arrange for a pre-operative appointment and lab work.  -Bring your insurance card, copy of an advanced directive if applicable, medication list  -At that visit, you will be asked to sign a consent for a possible blood transfusion in case a transfusion becomes necessary during surgery.  The need for a blood transfusion is rare but having consent is a necessary part of your care.     -You should not be taking blood thinners or aspirin at least ten days prior to surgery unless instructed by your surgeon.  -Do not take supplements such as fish oil (omega 3), red yeast rice, turmeric before your surgery. You want to avoid medications with aspirin in them including headache powders such as BC or Goody's), Excedrin migraine.  Day Before Surgery at Home -You will be asked to take in a light diet the day before surgery. You will be advised you can have clear liquids up until 3 hours before your surgery.    Eat a light diet the day before surgery.  Examples including soups, broths, toast,  yogurt, mashed potatoes.  AVOID GAS PRODUCING FOODS AND BEVERAGES. Things to avoid include carbonated beverages (fizzy beverages, sodas), raw fruits and raw vegetables (uncooked), or beans.   If your bowels are filled with gas, your surgeon will have difficulty visualizing your pelvic organs which increases your surgical risks.  Your role in recovery Your role is to become active as soon as directed by your doctor, while still giving yourself time to heal.  Rest when you feel tired. You will be asked to do the following in order to speed your recovery:  - Cough and breathe deeply. This helps to clear and expand your lungs and can prevent pneumonia after surgery.  - STAY ACTIVE WHEN YOU GET HOME. Do mild physical activity. Walking or moving your legs help your circulation and body functions return to normal. Do not try to get up or walk alone the first time after surgery.   -If you develop swelling on one leg or the other, pain in the back of your leg, redness/warmth in one of your legs, please call the office or go to the Emergency Room to have a doppler to rule out a blood clot. For shortness of breath, chest pain-seek care in the Emergency Room as soon as possible. - Actively manage your pain. Managing your pain lets you move in comfort. We will ask you to rate your pain on a scale of zero to 10. It is your responsibility to tell your doctor or nurse where and how much you hurt so your pain can be treated.  Special  Considerations -If you are diabetic, you may be placed on insulin after surgery to have closer control over your blood sugars to promote healing and recovery.  This does not mean that you will be discharged on insulin.  If applicable, your oral antidiabetics will be resumed when you are tolerating a solid diet.  -Your final pathology results from surgery should be available around one week after surgery and the results will be relayed to you when available.  -FMLA forms can be faxed to  [phone] and please allow 5-7 business days for completion.  Pain Management After Surgery -You have been prescribed your pain medication and bowel regimen medications before surgery so that you can have these available when you are discharged from the hospital. The pain medication is for use ONLY AFTER surgery and a new prescription will not be given.   -Make sure that you have Tylenol and Ibuprofen IF YOU ARE ABLE TO TAKE THESE MEDICATIONS at home to use on a regular basis after surgery for pain control. We recommend alternating the medications every hour to six hours since they work differently and are processed in the body differently for pain relief.  -Review the attached handout on narcotic use and their risks and side effects.   Bowel Regimen -You have been prescribed Sennakot-S to take nightly to prevent constipation especially if you are taking the narcotic pain medication intermittently.  It is important to prevent constipation and drink adequate amounts of liquids. You can stop taking this medication when you are not taking pain medication and you are back on your normal bowel routine.  Risks of Surgery Risks of surgery are low but include bleeding, infection, damage to surrounding structures, re-operation, blood clots, and very rarely death.   Blood Transfusion Information (For the consent to be signed before surgery)  We will be checking your blood type before surgery so in case of emergencies, we will know what type of blood you would need.                                            WHAT IS A BLOOD TRANSFUSION?  A transfusion is the replacement of blood or some of its parts. Blood is made up of multiple cells which provide different functions. Red blood cells carry oxygen and are used for blood loss replacement. White blood cells fight against infection. Platelets control bleeding. Plasma helps clot blood. Other blood products are available for specialized needs, such as  hemophilia or other clotting disorders. BEFORE THE TRANSFUSION  Who gives blood for transfusions?  You may be able to donate blood to be used at a later date on yourself (autologous donation). Relatives can be asked to donate blood. This is generally not any safer than if you have received blood from a stranger. The same precautions are taken to ensure safety when a relative's blood is donated. Healthy volunteers who are fully evaluated to make sure their blood is safe. This is blood bank blood. Transfusion therapy is the safest it has ever been in the practice of medicine. Before blood is taken from a donor, a complete history is taken to make sure that person has no history of diseases nor engages in risky social behavior (examples are intravenous drug use or sexual activity with multiple partners). The donor's travel history is screened to minimize risk of transmitting infections, such as malaria. The  donated blood is tested for signs of infectious diseases, such as HIV and hepatitis. The blood is then tested to be sure it is compatible with you in order to minimize the chance of a transfusion reaction. If you or a relative donates blood, this is often done in anticipation of surgery and is not appropriate for emergency situations. It takes many days to process the donated blood. RISKS AND COMPLICATIONS Although transfusion therapy is very safe and saves many lives, the main dangers of transfusion include:  Getting an infectious disease. Developing a transfusion reaction. This is an allergic reaction to something in the blood you were given. Every precaution is taken to prevent this. The decision to have a blood transfusion has been considered carefully by your caregiver before blood is given. Blood is not given unless the benefits outweigh the risks.  AFTER SURGERY INSTRUCTIONS  Return to work: 4-6 weeks if applicable  Activity: 1. Be up and out of the bed during the day.  Take a nap if needed.   You may walk up steps but be careful and use the hand rail.  Stair climbing will tire you more than you think, you may need to stop part way and rest.   2. No lifting or straining for 6 weeks over 10 pounds. No pushing, pulling, straining for 6 weeks.  3. No driving for around 1 week(s).  Do not drive if you are taking narcotic pain medicine and make sure that your reaction time has returned.   4. You can shower as soon as the next day after surgery. Shower daily.  Use your regular soap and water (not directly on the incision) and pat your incision(s) dry afterwards; don't rub.  No tub baths or submerging your body in water until cleared by your surgeon. If you have the soap that was given to you by pre-surgical testing that was used before surgery, you do not need to use it afterwards because this can irritate your incisions.   5. No sexual activity and nothing in the vagina for 8-10 weeks.  6. You may experience a small amount of clear drainage from your incisions, which is normal.  If the drainage persists, increases, or changes color please call the office.  7. Do not use creams, lotions, or ointments such as neosporin on your incisions after surgery until advised by your surgeon because they can cause removal of the dermabond glue on your incisions.    8. You may experience vaginal spotting after surgery or when the stitches at the top of the vagina begin to dissolve.  The spotting is normal but if you experience heavy bleeding, call our office.  9. Take Tylenol or ibuprofen first for pain if you are able to take these medications and only use narcotic pain medication for severe pain not relieved by the Tylenol or Ibuprofen.  Monitor your Tylenol intake to a max of 4,000 mg in a 24 hour period. You can alternate these medications after surgery.  Diet: 1. Low sodium Heart Healthy Diet is recommended but you are cleared to resume your normal (before surgery) diet after your procedure.  2. It  is safe to use a laxative, such as Miralax or Colace, if you have difficulty moving your bowels. You have been prescribed Sennakot-S to take at bedtime every evening after surgery to keep bowel movements regular and to prevent constipation.    Wound Care: 1. Keep clean and dry.  Shower daily.  Reasons to call the Doctor: Fever -  Oral temperature greater than 100.4 degrees Fahrenheit Foul-smelling vaginal discharge Difficulty urinating Nausea and vomiting Increased pain at the site of the incision that is unrelieved with pain medicine. Difficulty breathing with or without chest pain New calf pain especially if only on one side Sudden, continuing increased vaginal bleeding with or without clots.   Contacts: For questions or concerns you should contact:  Dr. Eugene Garnet at [phone]  Warner Mccreedy, NP at [phone]  After Hours: call [phone] and have the GYN Oncologist paged/contacted (after 5 pm or on the weekends). You will speak with an after hours RN and let he or she know you have had surgery.  Messages sent via mychart are for non-urgent matters and are not responded to after hours so for urgent needs, please call the after hours number.

## 2023-02-17 ENCOUNTER — Telehealth: Payer: Self-pay | Admitting: *Deleted

## 2023-02-17 NOTE — Telephone Encounter (Signed)
Fax surgical optimization form to PCP office

## 2023-02-18 NOTE — Patient Instructions (Signed)
DUE TO COVID-19 ONLY TWO VISITORS  (aged 66 and older)  ARE ALLOWED TO COME WITH YOU AND STAY IN THE WAITING ROOM ONLY DURING PRE OP AND PROCEDURE.   **NO VISITORS ARE ALLOWED IN THE SHORT STAY AREA OR RECOVERY ROOM!!**  IF YOU WILL BE ADMITTED INTO THE HOSPITAL YOU ARE ALLOWED ONLY FOUR SUPPORT PEOPLE DURING VISITATION HOURS ONLY (7 AM -8PM)   The support person(s) must pass our screening, gel in and out, and wear a mask at all times, including in the patient's room. Patients must also wear a mask when staff or their support person are in the room. Visitors GUEST BADGE MUST BE WORN VISIBLY  One adult visitor may remain with you overnight and MUST be in the room by 8 P.M.     Your procedure is scheduled on: 02/26/23   Report to [facility] Main Entrance    Report to admitting at : 10:15 AM   Call this number if you have problems the morning of surgery [phone]   Eat a light diet the day before surgery.  Examples including soups, broths, toast, yogurt, mashed potatoes.  Things to avoid include carbonated beverages (fizzy beverages), raw fruits and raw vegetables, or beans.   If your bowels are filled with gas, your surgeon will have difficulty visualizing your pelvic organs which increases your surgical risks.  Do not eat food :After Midnight.   After Midnight you may have the following liquids until : 9:30 AM DAY OF SURGERY  Water Black Coffee (sugar ok, NO MILK/CREAM OR CREAMERS)  Tea (sugar ok, NO MILK/CREAM OR CREAMERS) regular and decaf                             Plain Jell-O (NO RED)                                           Fruit ices (not with fruit pulp, NO RED)                                     Popsicles (NO RED)                                                                  Juice: apple, WHITE grape, WHITE cranberry Sports drinks like Gatorade (NO RED)              FOLLOW ANY ADDITIONAL PRE OP INSTRUCTIONS YOU RECEIVED FROM YOUR SURGEON'S OFFICE!!!    Oral Hygiene is also important to reduce your risk of infection.                                    Remember - BRUSH YOUR TEETH THE MORNING OF SURGERY WITH YOUR REGULAR TOOTHPASTE  DENTURES WILL BE REMOVED PRIOR TO SURGERY PLEASE DO NOT APPLY "Poly grip" OR ADHESIVES!!!   Do NOT smoke after Midnight   Take these medicines the morning of surgery with A SIP OF  WATER: pantoprazole.                 You may not have any metal on your body including hair pins, jewelry, and body piercing             Do not wear make-up, lotions, powders, perfumes/cologne, or deodorant  Do not wear nail polish including gel and S&S, artificial/acrylic nails, or any other type of covering on natural nails including finger and toenails. If you have artificial nails, gel coating, etc. that needs to be removed by a nail salon please have this removed prior to surgery or surgery may need to be canceled/ delayed if the surgeon/ anesthesia feels like they are unable to be safely monitored.   Do not shave  48 hours prior to surgery.    Do not bring valuables to the hospital. [facility] IS NOT             RESPONSIBLE   FOR VALUABLES.   Contacts, glasses, or bridgework may not be worn into surgery.   Bring small overnight bag day of surgery.   DO NOT BRING YOUR HOME MEDICATIONS TO THE HOSPITAL. PHARMACY WILL DISPENSE MEDICATIONS LISTED ON YOUR MEDICATION LIST TO YOU DURING YOUR ADMISSION IN THE HOSPITAL!    Patients discharged on the day of surgery will not be allowed to drive home.  Someone NEEDS to stay with you for the first 24 hours after anesthesia.   Special Instructions: Bring a copy of your healthcare power of attorney and living will documents         the day of surgery if you haven't scanned them before.              Please read over the following fact sheets you were given: IF YOU HAVE QUESTIONS ABOUT YOUR PRE-OP INSTRUCTIONS PLEASE CALL [phone]    [facility] - Preparing for Surgery Before  surgery, you can play an important role.  Because skin is not sterile, your skin needs to be as free of germs as possible.  You can reduce the number of germs on your skin by washing with CHG (chlorahexidine gluconate) soap before surgery.  CHG is an antiseptic cleaner which kills germs and bonds with the skin to continue killing germs even after washing. Please DO NOT use if you have an allergy to CHG or antibacterial soaps.  If your skin becomes reddened/irritated stop using the CHG and inform your nurse when you arrive at Short Stay. Do not shave (including legs and underarms) for at least 48 hours prior to the first CHG shower.  You may shave your face/neck. Please follow these instructions carefully:  1.  Shower with CHG Soap the night before surgery and the  morning of Surgery.  2.  If you choose to wash your hair, wash your hair first as usual with your  normal  shampoo.  3.  After you shampoo, rinse your hair and body thoroughly to remove the  shampoo.                           4.  Use CHG as you would any other liquid soap.  You can apply chg directly  to the skin and wash                       Gently with a scrungie or clean washcloth.  5.  Apply the CHG Soap to your body ONLY  FROM THE NECK DOWN.   Do not use on face/ open                           Wound or open sores. Avoid contact with eyes, ears mouth and genitals (private parts).                       Wash face,  Genitals (private parts) with your normal soap.             6.  Wash thoroughly, paying special attention to the area where your surgery  will be performed.  7.  Thoroughly rinse your body with warm water from the neck down.  8.  DO NOT shower/wash with your normal soap after using and rinsing off  the CHG Soap.                9.  Pat yourself dry with a clean towel.            10.  Wear clean pajamas.            11.  Place clean sheets on your bed the night of your first shower and do not  sleep with pets. Day of Surgery  : Do not apply any lotions/deodorants the morning of surgery.  Please wear clean clothes to the hospital/surgery center.  FAILURE TO FOLLOW THESE INSTRUCTIONS MAY RESULT IN THE CANCELLATION OF YOUR SURGERY PATIENT SIGNATURE_________________________________  NURSE SIGNATURE__________________________________  ________________________________________________________________________ WHAT IS A BLOOD TRANSFUSION? Blood Transfusion Information  A transfusion is the replacement of blood or some of its parts. Blood is made up of multiple cells which provide different functions. Red blood cells carry oxygen and are used for blood loss replacement. White blood cells fight against infection. Platelets control bleeding. Plasma helps clot blood. Other blood products are available for specialized needs, such as hemophilia or other clotting disorders. BEFORE THE TRANSFUSION  Who gives blood for transfusions?  Healthy volunteers who are fully evaluated to make sure their blood is safe. This is blood bank blood. Transfusion therapy is the safest it has ever been in the practice of medicine. Before blood is taken from a donor, a complete history is taken to make sure that person has no history of diseases nor engages in risky social behavior (examples are intravenous drug use or sexual activity with multiple partners). The donor's travel history is screened to minimize risk of transmitting infections, such as malaria. The donated blood is tested for signs of infectious diseases, such as HIV and hepatitis. The blood is then tested to be sure it is compatible with you in order to minimize the chance of a transfusion reaction. If you or a relative donates blood, this is often done in anticipation of surgery and is not appropriate for emergency situations. It takes many days to process the donated blood. RISKS AND COMPLICATIONS Although transfusion therapy is very safe and saves many lives, the main dangers of  transfusion include:  Getting an infectious disease. Developing a transfusion reaction. This is an allergic reaction to something in the blood you were given. Every precaution is taken to prevent this. The decision to have a blood transfusion has been considered carefully by your caregiver before blood is given. Blood is not given unless the benefits outweigh the risks. AFTER THE TRANSFUSION Right after receiving a blood transfusion, you will usually feel much better and more energetic. This is especially true if your  red blood cells have gotten low (anemic). The transfusion raises the level of the red blood cells which carry oxygen, and this usually causes an energy increase. The nurse administering the transfusion will monitor you carefully for complications. HOME CARE INSTRUCTIONS  No special instructions are needed after a transfusion. You may find your energy is better. Speak with your caregiver about any limitations on activity for underlying diseases you may have. SEEK MEDICAL CARE IF:  Your condition is not improving after your transfusion. You develop redness or irritation at the intravenous (IV) site. SEEK IMMEDIATE MEDICAL CARE IF:  Any of the following symptoms occur over the next 12 hours: Shaking chills. You have a temperature by mouth above 102 F (38.9 C), not controlled by medicine. Chest, back, or muscle pain. People around you feel you are not acting correctly or are confused. Shortness of breath or difficulty breathing. Dizziness and fainting. You get a rash or develop hives. You have a decrease in urine output. Your urine turns a dark color or changes to pink, red, or brown. Any of the following symptoms occur over the next 10 days: You have a temperature by mouth above 102 F (38.9 C), not controlled by medicine. Shortness of breath. Weakness after normal activity. The white part of the eye turns yellow (jaundice). You have a decrease in the amount of urine or are  urinating less often. Your urine turns a dark color or changes to pink, red, or brown. Document Released: 05/03/2000 Document Revised: 07/29/2011 Document Reviewed: 12/21/2007 [facility] Patient Information 2014 [place], [place].  _______________________________________________________________________

## 2023-02-19 ENCOUNTER — Telehealth: Payer: Self-pay | Admitting: Family Medicine

## 2023-02-19 ENCOUNTER — Encounter (HOSPITAL_COMMUNITY): Payer: Self-pay

## 2023-02-19 ENCOUNTER — Encounter (HOSPITAL_COMMUNITY)
Admission: RE | Admit: 2023-02-19 | Discharge: 2023-02-19 | Disposition: A | Discharge status: Home / Self Care | Payer: Medicare PPO | Source: Ambulatory Visit | Attending: Gynecologic Oncology | Admitting: Gynecologic Oncology

## 2023-02-19 ENCOUNTER — Other Ambulatory Visit: Payer: Self-pay

## 2023-02-19 VITALS — BP 110/73 | HR 69 | Temp 98.8°F | Ht 68.0 in | Wt 196.0 lb

## 2023-02-19 DIAGNOSIS — Z01810 Encounter for preprocedural cardiovascular examination: Secondary | ICD-10-CM | POA: Insufficient documentation

## 2023-02-19 DIAGNOSIS — Z01812 Encounter for preprocedural laboratory examination: Secondary | ICD-10-CM | POA: Insufficient documentation

## 2023-02-19 DIAGNOSIS — C541 Malignant neoplasm of endometrium: Secondary | ICD-10-CM | POA: Diagnosis not present

## 2023-02-19 DIAGNOSIS — Z01818 Encounter for other preprocedural examination: Secondary | ICD-10-CM | POA: Diagnosis present

## 2023-02-19 DIAGNOSIS — I2510 Atherosclerotic heart disease of native coronary artery without angina pectoris: Secondary | ICD-10-CM | POA: Diagnosis not present

## 2023-02-19 HISTORY — DX: Palpitations: R00.2

## 2023-02-19 HISTORY — DX: Malignant (primary) neoplasm, unspecified: C80.1

## 2023-02-19 HISTORY — DX: Cardiac arrhythmia, unspecified: I49.9

## 2023-02-19 HISTORY — DX: Unspecified osteoarthritis, unspecified site: M19.90

## 2023-02-19 HISTORY — DX: Pneumonia, unspecified organism: J18.9

## 2023-02-19 LAB — COMPREHENSIVE METABOLIC PANEL
ALT: 28 U/L (ref 0–44)
AST: 21 U/L (ref 15–41)
Albumin: 4.1 g/dL (ref 3.5–5.0)
Alkaline Phosphatase: 61 U/L (ref 38–126)
Anion gap: 8 (ref 5–15)
BUN: 21 mg/dL (ref 8–23)
CO2: 26 mmol/L (ref 22–32)
Calcium: 9.8 mg/dL (ref 8.9–10.3)
Chloride: 105 mmol/L (ref 98–111)
Creatinine, Ser: 0.7 mg/dL (ref 0.44–1.00)
GFR, Estimated: >60 mL/min (ref >60)
Glucose, Bld: 97 mg/dL (ref 70–99)
Potassium: 5.1 mmol/L (ref 3.5–5.1)
Sodium: 139 mmol/L (ref 135–145)
Total Bilirubin: 0.3 mg/dL (ref 0.3–1.2)
Total Protein: 7.2 g/dL (ref 6.5–8.1)

## 2023-02-19 LAB — CBC
HCT: 46.6 % — ABNORMAL HIGH (ref 36.0–46.0)
Hemoglobin: 15.1 g/dL — ABNORMAL HIGH (ref 12.0–15.0)
MCH: 32.1 pg (ref 26.0–34.0)
MCHC: 32.4 g/dL (ref 30.0–36.0)
MCV: 98.9 fL (ref 80.0–100.0)
Platelets: 318 10*3/uL (ref 150–400)
RBC: 4.71 MIL/uL (ref 3.87–5.11)
RDW: 13.2 % (ref 11.5–15.5)
WBC: 9.2 10*3/uL (ref 4.0–10.5)
nRBC: 0 % (ref 0.0–0.2)

## 2023-02-19 NOTE — Telephone Encounter (Signed)
Needs ov for surgical clearance. Has not been seen in a yr. Can be with any provider if Ruthine Dose is unavailable.

## 2023-02-19 NOTE — Progress Notes (Signed)
For Short Stay: COVID SWAB appointment date:  Bowel Prep reminder:   For Anesthesia: PCP - Jeani Sow, MD  Cardiologist - N/A  Chest x-ray -  EKG - 02/19/23 Stress Test -  ECHO -  Cardiac Cath -  Pacemaker/ICD device last checked: Pacemaker orders received: Device Rep notified:  Spinal Cord Stimulator: N/A  Sleep Study - N/A CPAP -   Fasting Blood Sugar - N/A Checks Blood Sugar _____ times a day Date and result of last Hgb A1c-  Last dose of GLP1 agonist- N/A GLP1 instructions:   Last dose of SGLT-2 inhibitors- N/A SGLT-2 instructions:   Blood Thinner Instructions: N/A Aspirin Instructions: Last Dose:  Activity level: Can go up a flight of stairs and activities of daily living without stopping and without chest pain and/or shortness of breath   Able to exercise without chest pain and/or shortness of breath  Anesthesia review: Hx: palpitations, Afib ( 2000 ).  Patient denies shortness of breath, fever, cough and chest pain at PAT appointment   Patient verbalized understanding of instructions that were given to them at the PAT appointment. Patient was also instructed that they will need to review over the PAT instructions again at home before surgery.

## 2023-02-21 ENCOUNTER — Encounter: Payer: Self-pay | Admitting: Family Medicine

## 2023-02-21 ENCOUNTER — Ambulatory Visit (INDEPENDENT_AMBULATORY_CARE_PROVIDER_SITE_OTHER): Payer: Medicare PPO | Admitting: Family Medicine

## 2023-02-21 VITALS — BP 105/65 | HR 69 | Temp 98.3°F | Resp 18 | Ht 68.0 in | Wt 199.1 lb

## 2023-02-21 DIAGNOSIS — Z01818 Encounter for other preprocedural examination: Secondary | ICD-10-CM

## 2023-02-21 DIAGNOSIS — C541 Malignant neoplasm of endometrium: Secondary | ICD-10-CM

## 2023-02-21 NOTE — Patient Instructions (Signed)
Cure electrolytes Compression stockings.

## 2023-02-21 NOTE — Progress Notes (Unsigned)
Subjective:     Patient ID: [name], female    DOB: Oct 12, 1955, 67 y.o.   MRN: [id number]  Chief Complaint  Patient presents with   Surgical Clearance    Surgical clearance, having surgery on 02/26/23 Had CBC done, hematocrit and Hemoglobin elevated    HPI New dx endometrial ca-having hyst 02/26/23. CT done at [facility]. Needs clearance.  Having some lower abd pain/irritation-gyn has her on doxy/flagyl-helping some Hasn't exercised much.  Walks .  Can climb flight of stairs fine.  No cp, sob.    Occ irrit colon-better on flagyl/doxy.  Colon due. Dr. Juanetta Gosling will sch when recovered from hyst.  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  Being followed by endocrine   Health Maintenance Due  Topic Date Due   DTaP/Tdap/Td (1 - Tdap) Never done   Colonoscopy  12/15/2022   Medicare Annual Wellness (AWV)  04/10/2023    Past Medical History:  Diagnosis Date   Arthritis    Atrial fibrillation (HCC) 05/20/1998   from stress/increased caffeine use, resolved   Cancer (HCC)    Cataract    Dysrhythmia    GERD (gastroesophageal reflux disease)    Hearing loss 05/20/2010   left ear   History of chicken pox    Hyperplastic colon polyp    Macroprolactinemia    Orthostatic hypotension    Palpitation    Pituitary adenoma (HCC)    micro   Pneumonia     Past Surgical History:  Procedure Laterality Date   CATARACT EXTRACTION     TONSILLECTOMY AND ADENOIDECTOMY  1965     Current Outpatient Medications:    Cholecalciferol 25 MCG (1000 UT) capsule, Take 1,000 Units by mouth daily., Disp: , Rfl:    Cyanocobalamin (B-12) 1000 MCG CAPS, Take 1,000 Units by mouth daily., Disp: , Rfl:    Magnesium 400 MG CAPS, Take 400 mg by mouth daily., Disp: , Rfl:    pantoprazole (PROTONIX) 40 MG tablet, Increase Pantoprazole to 40 mg twice daily before meals, Disp: 60 tablet, Rfl: 3   senna-docusate (SENOKOT-S) 8.6-50 MG tablet, Take 2 tablets by mouth at bedtime. For AFTER surgery,  do not take if having diarrhea, Disp: 30 tablet, Rfl: 0   traMADol (ULTRAM) 50 MG tablet, Take 1 tablet (50 mg total) by mouth every 6 (six) hours as needed for severe pain. For AFTER surgery only, do not take and drive, Disp: 10 tablet, Rfl: 0  Current Facility-Administered Medications:    0.9 %  sodium chloride infusion, 500 mL, Intravenous, Once, [name], [name], MD  Allergies  Allergen Reactions   Erythromycin Shortness Of Breath and Anaphylaxis   Procaine Palpitations    Chest Pain    Quinolones Other (See Comments)    Insomnia Stay awake all night   Neosporin [Bacitracin-Polymyxin B] Dermatitis   ROS neg/noncontributory except as noted HPI/below Light headed when squatting/bending over at times since Feb.  Still occurs occ. Drinking plenty of water.  No cp/palp/cough/wheeze Some constipation-miralax occ.    No HA.  No depression     Objective:     BP 105/65   Pulse 69   Temp 98.3 F (36.8 C) (Temporal)   Resp 18   Ht 5\' 8"  (1.727 m)   Wt 199 lb 2 oz (90.3 kg)   SpO2 98%   BMI 30.28 kg/m  Wt Readings from Last 3 Encounters:  02/21/23 199 lb 2 oz (90.3 kg)  02/19/23 196 lb (88.9 kg)  02/13/23 197 lb 12.8 oz (89.7  kg)    Physical Exam   Gen: WDWN NAD HEENT: NCAT, conjunctiva not injected, sclera nonicteric NECK:  supple, no thyromegaly, no nodes, no carotid bruits CARDIAC: RRR, S1S2+, no murmur. DP 2+B LUNGS: CTAB. No wheezes ABDOMEN:  BS+, soft, mildly tender lower abd, No HSM, no masses EXT:  no edema MSK: no gross abnormalities.  NEURO: A&O x3.  CN II-XII intact.  PSYCH: normal mood. Good eye contact     Assessment & Plan:  Endometrial cancer (HCC)  Pre-operative clearance  1.  Endometrial cancer-new diagnosis.  Scheduled on 10/9 for surgery.  She can climb stairs, walk without any chest pain/shortness of breath.  Cleared for surgery. 2.  Preop surgical clearance-patient is low risk. 3.  Occasional dizziness-advised to continue drinking plenty of  water, wear compression stockings, get up slowly, add electrolytes to her water once daily.  Return in about 3 months (around 05/24/2023) for annual physical.  Angelena Sole, MD

## 2023-02-21 NOTE — Telephone Encounter (Signed)
Received clearance

## 2023-02-25 ENCOUNTER — Telehealth: Payer: Self-pay | Admitting: *Deleted

## 2023-02-25 NOTE — Telephone Encounter (Signed)
Telephone call to check on pre-operative status.  Patient compliant with pre-operative instructions.  Reinforced nothing to eat after midnight. Clear liquids until 0915. Patient to arrive at 1015.  No questions or concerns voiced.  Instructed to call for any needs.  

## 2023-02-26 ENCOUNTER — Encounter (HOSPITAL_COMMUNITY): Payer: Self-pay | Admitting: Gynecologic Oncology

## 2023-02-26 ENCOUNTER — Other Ambulatory Visit: Payer: Self-pay

## 2023-02-26 ENCOUNTER — Encounter (HOSPITAL_COMMUNITY)
Admission: RE | Disposition: A | Discharge status: Home / Self Care | Payer: Self-pay | Source: Ambulatory Visit | Attending: Gynecologic Oncology

## 2023-02-26 ENCOUNTER — Ambulatory Visit (HOSPITAL_COMMUNITY)
Admission: RE | Admit: 2023-02-26 | Discharge: 2023-02-26 | Disposition: A | Discharge status: Home / Self Care | Payer: Medicare PPO | Source: Ambulatory Visit | Attending: Gynecologic Oncology | Admitting: Gynecologic Oncology

## 2023-02-26 ENCOUNTER — Ambulatory Visit (HOSPITAL_COMMUNITY): Payer: Medicare PPO | Admitting: Physician Assistant

## 2023-02-26 ENCOUNTER — Ambulatory Visit (HOSPITAL_BASED_OUTPATIENT_CLINIC_OR_DEPARTMENT_OTHER): Payer: Medicare PPO

## 2023-02-26 DIAGNOSIS — N888 Other specified noninflammatory disorders of cervix uteri: Secondary | ICD-10-CM | POA: Diagnosis not present

## 2023-02-26 DIAGNOSIS — Z86018 Personal history of other benign neoplasm: Secondary | ICD-10-CM | POA: Diagnosis not present

## 2023-02-26 DIAGNOSIS — C541 Malignant neoplasm of endometrium: Secondary | ICD-10-CM

## 2023-02-26 DIAGNOSIS — K5900 Constipation, unspecified: Secondary | ICD-10-CM | POA: Diagnosis not present

## 2023-02-26 DIAGNOSIS — Z79899 Other long term (current) drug therapy: Secondary | ICD-10-CM | POA: Insufficient documentation

## 2023-02-26 DIAGNOSIS — I4891 Unspecified atrial fibrillation: Secondary | ICD-10-CM

## 2023-02-26 HISTORY — PX: ROBOTIC ASSISTED TOTAL HYSTERECTOMY WITH BILATERAL SALPINGO OOPHERECTOMY: SHX6086

## 2023-02-26 HISTORY — PX: SENTINEL NODE BIOPSY: SHX6608

## 2023-02-26 LAB — ABO/RH: ABO/RH(D): A NEG

## 2023-02-26 LAB — TYPE AND SCREEN
ABO/RH(D): A NEG
Antibody Screen: NEGATIVE

## 2023-02-26 SURGERY — HYSTERECTOMY, TOTAL, ROBOT-ASSISTED, LAPAROSCOPIC, WITH BILATERAL SALPINGO-OOPHORECTOMY
Anesthesia: General

## 2023-02-26 MED ORDER — FENTANYL CITRATE PF 50 MCG/ML IJ SOSY
PREFILLED_SYRINGE | INTRAMUSCULAR | Status: AC
  Filled 2023-02-26: qty 1

## 2023-02-26 MED ORDER — DEXAMETHASONE SODIUM PHOSPHATE 10 MG/ML IJ SOLN
INTRAMUSCULAR | Status: AC
  Filled 2023-02-26: qty 1

## 2023-02-26 MED ORDER — BSS IO SOLN
15.0000 mL | Freq: Once | INTRAOCULAR | Status: AC
  Administered 2023-02-26: 15 mL
  Filled 2023-02-26: qty 15

## 2023-02-26 MED ORDER — TRAMADOL HCL 50 MG PO TABS
ORAL_TABLET | ORAL | Status: AC
  Filled 2023-02-26: qty 1

## 2023-02-26 MED ORDER — ONDANSETRON HCL 4 MG/2ML IJ SOLN
INTRAMUSCULAR | Status: AC
  Filled 2023-02-26: qty 2

## 2023-02-26 MED ORDER — STERILE WATER FOR INJECTION IJ SOLN
INTRAMUSCULAR | Status: DC | PRN
  Administered 2023-02-26: 1 mL

## 2023-02-26 MED ORDER — TRAMADOL HCL 50 MG PO TABS
50.0000 mg | ORAL_TABLET | Freq: Once | ORAL | Status: AC
  Administered 2023-02-26: 50 mg via ORAL

## 2023-02-26 MED ORDER — DEXAMETHASONE SODIUM PHOSPHATE 4 MG/ML IJ SOLN: 4.0000 mg | INTRAMUSCULAR | Status: DC

## 2023-02-26 MED ORDER — LACTATED RINGERS IV SOLN: INTRAVENOUS | Status: DC

## 2023-02-26 MED ORDER — PROPOFOL 10 MG/ML IV BOLUS
INTRAVENOUS | Status: DC | PRN
  Administered 2023-02-26: 150 mg via INTRAVENOUS

## 2023-02-26 MED ORDER — ACETAMINOPHEN 10 MG/ML IV SOLN: 1000.0000 mg | Freq: Once | INTRAVENOUS | Status: DC | PRN

## 2023-02-26 MED ORDER — FENTANYL CITRATE PF 50 MCG/ML IJ SOSY
25.0000 ug | PREFILLED_SYRINGE | INTRAMUSCULAR | Status: DC | PRN
  Administered 2023-02-26 (×3): 50 ug via INTRAVENOUS

## 2023-02-26 MED ORDER — PROPOFOL 10 MG/ML IV BOLUS
INTRAVENOUS | Status: AC
  Filled 2023-02-26: qty 20

## 2023-02-26 MED ORDER — FENTANYL CITRATE (PF) 100 MCG/2ML IJ SOLN
INTRAMUSCULAR | Status: DC | PRN
  Administered 2023-02-26: 100 ug via INTRAVENOUS

## 2023-02-26 MED ORDER — MIDAZOLAM HCL 2 MG/2ML IJ SOLN
INTRAMUSCULAR | Status: DC | PRN
  Administered 2023-02-26: 2 mg via INTRAVENOUS

## 2023-02-26 MED ORDER — ROCURONIUM BROMIDE 10 MG/ML (PF) SYRINGE
PREFILLED_SYRINGE | INTRAVENOUS | Status: AC
  Filled 2023-02-26: qty 10

## 2023-02-26 MED ORDER — STERILE WATER FOR IRRIGATION IR SOLN
Status: DC | PRN
  Administered 2023-02-26: 500 mL

## 2023-02-26 MED ORDER — ORAL CARE MOUTH RINSE: 15.0000 mL | Freq: Once | OROMUCOSAL | Status: AC

## 2023-02-26 MED ORDER — ONDANSETRON HCL 4 MG PO TABS: 4.0000 mg | ORAL_TABLET | Freq: Four times a day (QID) | ORAL | Status: DC | PRN

## 2023-02-26 MED ORDER — ONDANSETRON HCL 4 MG/2ML IJ SOLN: 4.0000 mg | Freq: Four times a day (QID) | INTRAMUSCULAR | Status: DC | PRN

## 2023-02-26 MED ORDER — ONDANSETRON HCL 4 MG/2ML IJ SOLN
INTRAMUSCULAR | Status: DC | PRN
  Administered 2023-02-26: 4 mg via INTRAVENOUS

## 2023-02-26 MED ORDER — ACETAMINOPHEN 500 MG PO TABS
1000.0000 mg | ORAL_TABLET | ORAL | Status: AC
  Administered 2023-02-26: 1000 mg via ORAL
  Filled 2023-02-26: qty 2

## 2023-02-26 MED ORDER — OXYCODONE HCL 5 MG/5ML PO SOLN: 5.0000 mg | Freq: Once | ORAL | Status: DC | PRN

## 2023-02-26 MED ORDER — CEFAZOLIN SODIUM-DEXTROSE 2-4 GM/100ML-% IV SOLN
2.0000 g | INTRAVENOUS | Status: AC
  Administered 2023-02-26: 2 g via INTRAVENOUS
  Filled 2023-02-26: qty 100

## 2023-02-26 MED ORDER — BUPIVACAINE HCL 0.25 % IJ SOLN
INTRAMUSCULAR | Status: AC
  Filled 2023-02-26: qty 1

## 2023-02-26 MED ORDER — LACTATED RINGERS IV SOLN
INTRAVENOUS | Status: DC | PRN
Start: 2023-02-26 — End: 2023-02-26

## 2023-02-26 MED ORDER — DEXAMETHASONE SODIUM PHOSPHATE 10 MG/ML IJ SOLN
INTRAMUSCULAR | Status: DC | PRN
  Administered 2023-02-26: 4 mg via INTRAVENOUS

## 2023-02-26 MED ORDER — MIDAZOLAM HCL 2 MG/2ML IJ SOLN
INTRAMUSCULAR | Status: AC
  Filled 2023-02-26: qty 2

## 2023-02-26 MED ORDER — LIDOCAINE 2% (20 MG/ML) 5 ML SYRINGE
INTRAMUSCULAR | Status: DC | PRN
  Administered 2023-02-26: 100 mg via INTRAVENOUS

## 2023-02-26 MED ORDER — OXYCODONE HCL 5 MG PO TABS: 5.0000 mg | ORAL_TABLET | Freq: Once | ORAL | Status: DC | PRN

## 2023-02-26 MED ORDER — HEPARIN SODIUM (PORCINE) 5000 UNIT/ML IJ SOLN
5000.0000 [IU] | INTRAMUSCULAR | Status: AC
  Administered 2023-02-26: 5000 [IU] via SUBCUTANEOUS
  Filled 2023-02-26: qty 1

## 2023-02-26 MED ORDER — ROCURONIUM BROMIDE 10 MG/ML (PF) SYRINGE
PREFILLED_SYRINGE | INTRAVENOUS | Status: DC | PRN
  Administered 2023-02-26: 70 mg via INTRAVENOUS
  Administered 2023-02-26: 30 mg via INTRAVENOUS

## 2023-02-26 MED ORDER — LIDOCAINE HCL (PF) 2 % IJ SOLN
INTRAMUSCULAR | Status: AC
  Filled 2023-02-26: qty 5

## 2023-02-26 MED ORDER — CHLORHEXIDINE GLUCONATE 0.12 % MT SOLN
15.0000 mL | Freq: Once | OROMUCOSAL | Status: AC
  Administered 2023-02-26: 15 mL via OROMUCOSAL

## 2023-02-26 MED ORDER — CIPROFLOXACIN HCL 0.3 % OP SOLN
1.0000 [drp] | Freq: Four times a day (QID) | OPHTHALMIC | Status: DC
  Administered 2023-02-26: 1 [drp] via OPHTHALMIC
  Filled 2023-02-26: qty 2.5

## 2023-02-26 MED ORDER — METRONIDAZOLE 500 MG/100ML IV SOLN
500.0000 mg | INTRAVENOUS | Status: AC
  Administered 2023-02-26: 500 mg via INTRAVENOUS
  Filled 2023-02-26: qty 100

## 2023-02-26 MED ORDER — KETOROLAC TROMETHAMINE 0.5 % OP SOLN
1.0000 [drp] | Freq: Four times a day (QID) | OPHTHALMIC | Status: DC
  Administered 2023-02-26: 1 [drp] via OPHTHALMIC
  Filled 2023-02-26: qty 5

## 2023-02-26 MED ORDER — BUPIVACAINE HCL 0.25 % IJ SOLN
INTRAMUSCULAR | Status: DC | PRN
  Administered 2023-02-26: 22 mL

## 2023-02-26 MED ORDER — STERILE WATER FOR INJECTION IJ SOLN
INTRAMUSCULAR | Status: DC | PRN
  Administered 2023-02-26: 4 mL

## 2023-02-26 MED ORDER — FENTANYL CITRATE (PF) 100 MCG/2ML IJ SOLN
INTRAMUSCULAR | Status: AC
  Filled 2023-02-26: qty 2

## 2023-02-26 MED ORDER — DROPERIDOL 2.5 MG/ML IJ SOLN: 0.6250 mg | Freq: Once | INTRAMUSCULAR | Status: DC | PRN

## 2023-02-26 MED ORDER — SUGAMMADEX SODIUM 200 MG/2ML IV SOLN
INTRAVENOUS | Status: DC | PRN
  Administered 2023-02-26: 200 mg via INTRAVENOUS

## 2023-02-26 MED ORDER — SODIUM CHLORIDE 0.9 % IR SOLN
Status: DC | PRN
Start: 2023-02-26 — End: 2023-02-26
  Administered 2023-02-26: 250 mL

## 2023-02-26 SURGICAL SUPPLY — 77 items
ADH SKN CLS APL DERMABOND .7 (GAUZE/BANDAGES/DRESSINGS) ×2
AGENT HMST KT MTR STRL THRMB (HEMOSTASIS)
APL ESCP 34 STRL LF DISP (HEMOSTASIS)
APPLICATOR SURGIFLO ENDO (HEMOSTASIS) IMPLANT
BAG COUNTER SPONGE SURGICOUNT (BAG) IMPLANT
BAG LAPAROSCOPIC 12 15 PORT 16 (BASKET) IMPLANT
BAG RETRIEVAL 12/15 (BASKET)
BAG SPNG CNTER NS LX DISP (BAG)
BLADE SURG SZ10 CARB STEEL (BLADE) IMPLANT
COVER BACK TABLE 60X90IN (DRAPES) ×2 IMPLANT
COVER TIP SHEARS 8 DVNC (MISCELLANEOUS) ×2 IMPLANT
DERMABOND ADVANCED .7 DNX12 (GAUZE/BANDAGES/DRESSINGS) ×2 IMPLANT
DRAPE ARM DVNC X/XI (DISPOSABLE) ×8 IMPLANT
DRAPE COLUMN DVNC XI (DISPOSABLE) ×2 IMPLANT
DRAPE SHEET LG 3/4 BI-LAMINATE (DRAPES) ×2 IMPLANT
DRAPE SURG IRRIG POUCH 19X23 (DRAPES) ×2 IMPLANT
DRIVER NDL MEGA SUTCUT DVNCXI (INSTRUMENTS) ×2 IMPLANT
DRIVER NDLE MEGA SUTCUT DVNCXI (INSTRUMENTS) ×2
DRSG OPSITE POSTOP 4X6 (GAUZE/BANDAGES/DRESSINGS) IMPLANT
DRSG OPSITE POSTOP 4X8 (GAUZE/BANDAGES/DRESSINGS) IMPLANT
ELECT PENCIL ROCKER SW 15FT (MISCELLANEOUS) IMPLANT
ELECT REM PT RETURN 15FT ADLT (MISCELLANEOUS) ×2 IMPLANT
FORCEPS BPLR FENES DVNC XI (FORCEP) ×2 IMPLANT
FORCEPS PROGRASP DVNC XI (FORCEP) ×2 IMPLANT
GAUZE 4X4 16PLY ~~LOC~~+RFID DBL (SPONGE) ×4 IMPLANT
GLOVE BIO SURGEON STRL SZ 6 (GLOVE) ×8 IMPLANT
GLOVE BIO SURGEON STRL SZ 6.5 (GLOVE) ×2 IMPLANT
GOWN STRL REUS W/ TWL LRG LVL3 (GOWN DISPOSABLE) ×8 IMPLANT
GOWN STRL REUS W/TWL LRG LVL3 (GOWN DISPOSABLE) ×8
GRASPER SUT TROCAR 14GX15 (MISCELLANEOUS) IMPLANT
HOLDER FOLEY CATH W/STRAP (MISCELLANEOUS) IMPLANT
IRRIG SUCT STRYKERFLOW 2 WTIP (MISCELLANEOUS) ×2
IRRIGATION SUCT STRKRFLW 2 WTP (MISCELLANEOUS) ×2 IMPLANT
KIT PROCEDURE DVNC SI (MISCELLANEOUS) IMPLANT
KIT TURNOVER KIT A (KITS) IMPLANT
LIGASURE IMPACT 36 18CM CVD LR (INSTRUMENTS) IMPLANT
MANIPULATOR ADVINCU DEL 3.0 PL (MISCELLANEOUS) IMPLANT
MANIPULATOR ADVINCU DEL 3.5 PL (MISCELLANEOUS) IMPLANT
MANIPULATOR UTERINE 4.5 ZUMI (MISCELLANEOUS) IMPLANT
NDL HYPO 21X1.5 SAFETY (NEEDLE) ×2 IMPLANT
NDL SPNL 18GX3.5 QUINCKE PK (NEEDLE) IMPLANT
NEEDLE HYPO 21X1.5 SAFETY (NEEDLE) ×2
NEEDLE SPNL 18GX3.5 QUINCKE PK (NEEDLE)
OBTURATOR OPTICAL STND 8 DVNC (TROCAR) ×2
OBTURATOR OPTICALSTD 8 DVNC (TROCAR) ×2 IMPLANT
PACK ROBOT GYN CUSTOM WL (TRAY / TRAY PROCEDURE) ×2 IMPLANT
PAD POSITIONING PINK XL (MISCELLANEOUS) ×2 IMPLANT
PORT ACCESS TROCAR AIRSEAL 12 (TROCAR) IMPLANT
SCISSORS MNPLR CVD DVNC XI (INSTRUMENTS) ×2 IMPLANT
SCRUB CHG 4% DYNA-HEX 4OZ (MISCELLANEOUS) IMPLANT
SEAL UNIV 5-12 XI (MISCELLANEOUS) ×8 IMPLANT
SET TRI-LUMEN FLTR TB AIRSEAL (TUBING) ×2 IMPLANT
SPIKE FLUID TRANSFER (MISCELLANEOUS) ×2 IMPLANT
SPONGE T-LAP 18X18 ~~LOC~~+RFID (SPONGE) IMPLANT
SURGIFLO W/THROMBIN 8M KIT (HEMOSTASIS) IMPLANT
SUT MNCRL AB 4-0 PS2 18 (SUTURE) IMPLANT
SUT PDS AB 1 TP1 96 (SUTURE) IMPLANT
SUT V-LOC 180 0-0 GS22 (SUTURE) IMPLANT
SUT VIC AB 0 CT1 27 (SUTURE)
SUT VIC AB 0 CT1 27XBRD ANTBC (SUTURE) IMPLANT
SUT VIC AB 2-0 CT1 27 (SUTURE)
SUT VIC AB 2-0 CT1 TAPERPNT 27 (SUTURE) IMPLANT
SUT VIC AB 4-0 PS2 18 (SUTURE) ×4 IMPLANT
SUT VICRYL 0 27 CT2 27 ABS (SUTURE) ×2 IMPLANT
SUT VLOC 180 0 9IN GS21 (SUTURE) IMPLANT
SYR 10ML LL (SYRINGE) IMPLANT
SYS BAG RETRIEVAL 10MM (BASKET)
SYS WOUND ALEXIS 18CM MED (MISCELLANEOUS)
SYSTEM BAG RETRIEVAL 10MM (BASKET) IMPLANT
SYSTEM WOUND ALEXIS 18CM MED (MISCELLANEOUS) IMPLANT
TOWEL OR NON WOVEN STRL DISP B (DISPOSABLE) IMPLANT
TRAP SPECIMEN MUCUS 40CC (MISCELLANEOUS) IMPLANT
TRAY FOLEY MTR SLVR 16FR STAT (SET/KITS/TRAYS/PACK) ×2 IMPLANT
TROCAR PORT AIRSEAL 5X120 (TROCAR) IMPLANT
UNDERPAD 30X36 HEAVY ABSORB (UNDERPADS AND DIAPERS) ×4 IMPLANT
WATER STERILE IRR 1000ML POUR (IV SOLUTION) ×2 IMPLANT
YANKAUER SUCT BULB TIP 10FT TU (MISCELLANEOUS) IMPLANT

## 2023-02-26 NOTE — Transfer of Care (Signed)
Immediate Anesthesia Transfer of Care Note  Patient: Crystal Bauer  Procedure(s) Performed: XI ROBOTIC ASSISTED TOTAL HYSTERECTOMY WITH BILATERAL SALPINGO OOPHORECTOMY (Bilateral) SENTINEL NODE BIOPSY  Patient Location: PACU  Anesthesia Type:General  Level of Consciousness: drowsy  Airway & Oxygen Therapy: Patient Spontanous Breathing and Patient connected to face mask oxygen  Post-op Assessment: Report given to RN, Post -op Vital signs reviewed and stable, and Patient moving all extremities X 4  Post vital signs: Reviewed and stable  Last Vitals:  Vitals Value Taken Time  BP 148/76   Temp    Pulse 64 02/26/23 1606  Resp 19 02/26/23 1606  SpO2 98 % 02/26/23 1606  Vitals shown include unfiled device data.  Last Pain:  Vitals:   02/26/23 1101  TempSrc: Oral  PainSc:          Complications: No notable events documented.

## 2023-02-26 NOTE — Anesthesia Procedure Notes (Signed)
Procedure Name: Intubation Date/Time: 02/26/2023 1:34 PM  Performed by: Nelle Don, CRNAPre-anesthesia Checklist: Patient identified, Emergency Drugs available, Suction available and Patient being monitored Patient Re-evaluated:Patient Re-evaluated prior to induction Oxygen Delivery Method: Circle system utilized Preoxygenation: Pre-oxygenation with 100% oxygen Induction Type: IV induction Ventilation: Mask ventilation without difficulty and Oral airway inserted - appropriate to patient size Laryngoscope Size: Mac and 4 Grade View: Grade I Tube type: Oral Tube size: 7.0 mm Number of attempts: 1 Airway Equipment and Method: Stylet Placement Confirmation: ETT inserted through vocal cords under direct vision, positive ETCO2 and breath sounds checked- equal and bilateral Secured at: 21 cm Tube secured with: Tape Dental Injury: Teeth and Oropharynx as per pre-operative assessment

## 2023-02-26 NOTE — Anesthesia Preprocedure Evaluation (Signed)
Anesthesia Evaluation  Patient identified by MRN, date of birth, ID band Patient awake    Reviewed: Allergy & Precautions, H&P , NPO status , Patient's Chart, lab work & pertinent test results  Airway Mallampati: II  TM Distance: >3 FB Neck ROM: Full    Dental no notable dental hx.    Pulmonary former smoker   Pulmonary exam normal breath sounds clear to auscultation       Cardiovascular Normal cardiovascular exam+ dysrhythmias Atrial Fibrillation  Rhythm:Regular Rate:Normal     Neuro/Psych Pituitary adenoma  negative psych ROS   GI/Hepatic Neg liver ROS,GERD  ,,Barretts esophagus   Endo/Other  negative endocrine ROS    Renal/GU negative Renal ROS  negative genitourinary   Musculoskeletal  (+) Arthritis ,    Abdominal   Peds negative pediatric ROS (+)  Hematology negative hematology ROS (+)   Anesthesia Other Findings   Reproductive/Obstetrics negative OB ROS                             Anesthesia Physical Anesthesia Plan  ASA: 3  Anesthesia Plan: General   Post-op Pain Management:    Induction: Intravenous  PONV Risk Score and Plan: Ondansetron and Dexamethasone  Airway Management Planned: Oral ETT  Additional Equipment:   Intra-op Plan:   Post-operative Plan: Extubation in OR  Informed Consent: I have reviewed the patients History and Physical, chart, labs and discussed the procedure including the risks, benefits and alternatives for the proposed anesthesia with the patient or authorized representative who has indicated his/her understanding and acceptance.     Dental advisory given  Plan Discussed with: CRNA  Anesthesia Plan Comments:        Anesthesia Quick Evaluation

## 2023-02-26 NOTE — Interval H&P Note (Signed)
History and Physical Interval Note:  02/26/2023 1:01 PM  Crystal Bauer  has presented today for surgery, with the diagnosis of ENDOMETRIAL CANCER.  The various methods of treatment have been discussed with the patient and family. After consideration of risks, benefits and other options for treatment, the patient has consented to  Procedure(s): XI ROBOTIC ASSISTED TOTAL HYSTERECTOMY WITH BILATERAL SALPINGO OOPHORECTOMY,POSSBLE LAPAROTOMY (Bilateral) SENTINEL NODE BIOPSY (N/A) POSSIBLE LYMPH NODE DISSECTION (N/A) as a surgical intervention.  The patient's history has been reviewed, patient examined, no change in status, stable for surgery.  I have reviewed the patient's chart and labs.  Questions were answered to the patient's satisfaction.     Carver Fila

## 2023-02-26 NOTE — Anesthesia Postprocedure Evaluation (Signed)
Anesthesia Post Note  Patient: Crystal Bauer  Procedure(s) Performed: XI ROBOTIC ASSISTED TOTAL HYSTERECTOMY WITH BILATERAL SALPINGO OOPHORECTOMY (Bilateral) SENTINEL NODE BIOPSY     Patient location during evaluation: PACU Anesthesia Type: General Level of consciousness: awake and alert Pain management: pain level controlled Vital Signs Assessment: post-procedure vital signs reviewed and stable Respiratory status: spontaneous breathing, nonlabored ventilation, respiratory function stable and patient connected to nasal cannula oxygen Cardiovascular status: blood pressure returned to baseline and stable Postop Assessment: no apparent nausea or vomiting Anesthetic complications: yes Comments: Corneal abrasion in Right eye. Toradol + Ciprofloxacin eye drops ordered. Instructed to call back anesthesia department in 24 hours if symptoms do not improve for possible ophthalmology consult.    Encounter Notable Events  Notable Event Outcome Phase Comment  Corneal abrasion  Intraprocedure     Last Vitals:  Vitals:   02/26/23 1605 02/26/23 1615  BP: (!) 148/76 (!) 144/62  Pulse: 64 (!) 58  Resp: 19 12  Temp: (!) 36.3 C   SpO2: 98% 100%    Last Pain:  Vitals:   02/26/23 1631  TempSrc:   PainSc: 5                  [place]

## 2023-02-26 NOTE — Op Note (Addendum)
OPERATIVE NOTE  Pre-operative Diagnosis: endometrial cancer grade 1  Post-operative Diagnosis: same  Operation: Robotic-assisted laparoscopic total hysterectomy with bilateral salpingoophorectomy, SLN biopsy bilaterally   Surgeon: Eugene Garnet MD  Assistant Surgeon: Warner Mccreedy NP (an NP assistant was necessary for tissue manipulation, management of robotic instrumentation, retraction and positioning due to the complexity of the case and hospital policies).   Anesthesia: GET  Urine Output: 150 cc  Operative Findings: On EUA, small mobile uterus. Nabothian cysts of the cervix noted. On intra-abdominal entry, normal upper abdominal survey. Normal small and large bowel, omentum. No ascites. Uterus 8 cm and normal in appearance. 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 Mapping successful to a left obturator SLN, right obturator SLN (green channel to the node but node itself not green), possible right internal iliac SLN (removed). No obvious adenopathy. Some adhesions between bladder and cervix.  Given tumor noted delivering through the cervix, pelvis and vagina well irrigated.   Estimated Blood Loss:  75 cc      Total IV Fluids: see I&O flowsheet         Specimens: uterus, cervix, bilateral tubes and ovaries, right obturator and internal iliac SLN, left obturator SLN         Complications:  None apparent; patient tolerated the procedure well.         Disposition: PACU - hemodynamically stable.  Procedure Details  The patient was seen in the Holding Room. The risks, benefits, complications, treatment options, and expected outcomes were discussed with the patient.  The patient concurred with the proposed plan, giving informed consent.  The site of surgery properly noted/marked. The patient was identified as Crystal Bauer and the procedure verified as a Robotic-assisted hysterectomy with bilateral salpingo oophorectomy with SLN  biopsy.   After induction of anesthesia, the patient was draped and prepped in the usual sterile manner. Patient was placed in supine position after anesthesia and draped and prepped in the usual sterile manner as follows: Her arms were tucked to her side with all appropriate precautions.  The patient was secured to the bed using padding and tape across her chest.  The patient was placed in the semi-lithotomy position in [place] stirrups.  The perineum and vagina were prepped with CHG. The patient's abdomen was prepped with ChloraPrep and she was draped after the prep had been allowed to dry for 3 minutes.  A Time Out was held and the above information confirmed.  The urethra was prepped with Betadine. Foley catheter was placed.  A sterile speculum was placed in the vagina.  The cervix was grasped with a single-tooth tenaculum. 2mg  total of ICG was injected into the cervical stroma at 2 and 9 o'clock with 1cc injected at a 1cm and 2mm depth (concentration 0.5mg /ml) in all locations. The cervix was dilated with Shawnie Pons dilators.  The ZUMI uterine manipulator with a small colpotomizer ring was placed without difficulty.  A pneum occluder balloon was placed over the manipulator.  OG tube placement was confirmed and to suction.   Next, a 10 mm skin incision was made 1 cm below the subcostal margin in the midclavicular line.  The 5 mm Optiview port and scope was used for direct entry.  Opening pressure was under 10 mm CO2.  The abdomen was insufflated and the findings were noted as above.   At this point and all points during the procedure, the patient's intra-abdominal pressure did not exceed 15 mmHg. Next, an 8 mm skin  incision was made superior to the umbilicus and a right and left port were placed about 8 cm lateral to the robot port on the right and left side.  A fourth arm was placed on the right.  The 5 mm assist trocar was exchanged for a 10-12 mm port. All ports were placed under direct visualization.  The  patient was placed in steep Trendelenburg.  Bowel was folded away into the upper abdomen.  The robot was docked in the normal manner.  The right and left peritoneum were opened parallel to the IP ligament to open the retroperitoneal spaces bilaterally. The round ligaments were transected. The SLN mapping was performed in bilateral pelvic basins. After identifying the ureters, the para rectal and paravesical spaces were opened up entirely with careful dissection below the level of the ureters bilaterally and to the depth of the uterine artery origin in order to skeletonize the uterine "web" and ensure visualization of all parametrial channels. The para-aortic basins were carefully exposed and evaluated for isolated para-aortic SLN's. Lymphatic channels were identified travelling to the following visualized sentinel lymph node's: right obturator (channels to the node green), right internal iliac, and left obturator SLN. These SLN's were separated from their surrounding lymphatic tissue, removed and sent for permanent pathology.  The hysterectomy was started.  The ureter was again noted to be on the medial leaf of the broad ligament.  The peritoneum above the ureter was incised and stretched and the infundibulopelvic ligament was skeletonized, cauterized and cut.  The posterior peritoneum was taken down to the level of the KOH ring.  The anterior peritoneum was also taken down.  The bladder flap was created to the level of the KOH ring.  The uterine artery on the right side was skeletonized, cauterized and cut in the normal manner.  A similar procedure was performed on the left.  The colpotomy was made and the uterus, cervix, bilateral ovaries and tubes were amputated and delivered through the vagina.  Pedicles were inspected and excellent hemostasis was achieved.    The colpotomy at the vaginal cuff was closed with 0 Vicryl with a figure of eight at each apex and 0 V-Loc to close the midportion of the cuff with  a two layer closure in a running manner.  Irrigation was used and excellent hemostasis was achieved.  Intra-abdominal pressure was decreased to 5 mmHg with excellent hemostasis maintained. At this point in the procedure was completed.  Robotic instruments were removed under direct visulaization.  The robot was undocked. The fascia at the 10-12 mm port was closed with 0 Vicryl using a PMI fascial closure device.  The subcuticular tissue was closed with 4-0 Vicryl and the skin was closed with 4-0 Monocryl in a subcuticular manner.  Dermabond was applied.    The vagina was swabbed with  minimal bleeding noted. Vagina was irrigated. Foley catheter was removed  All sponge, lap and needle counts were correct x  3.   The patient was transferred to the recovery room in stable condition.  Eugene Garnet, MD

## 2023-02-26 NOTE — Discharge Instructions (Addendum)
AFTER SURGERY INSTRUCTIONS   Return to work: 4-6 weeks if applicable   Activity: 1. Be up and out of the bed during the day.  Take a nap if needed.  You may walk up steps but be careful and use the hand rail.  Stair climbing will tire you more than you think, you may need to stop part way and rest.    2. No lifting or straining for 6 weeks over 10 pounds. No pushing, pulling, straining for 6 weeks.   3. No driving for around 1 week(s).  Do not drive if you are taking narcotic pain medicine and make sure that your reaction time has returned.    4. You can shower as soon as the next day after surgery. Shower daily.  Use your regular soap and water (not directly on the incision) and pat your incision(s) dry afterwards; don't rub.  No tub baths or submerging your body in water until cleared by your surgeon. If you have the soap that was given to you by pre-surgical testing that was used before surgery, you do not need to use it afterwards because this can irritate your incisions.    5. No sexual activity and nothing in the vagina for 8-10 weeks.   6. You may experience a small amount of clear drainage from your incisions, which is normal.  If the drainage persists, increases, or changes color please call the office.   7. Do not use creams, lotions, or ointments such as neosporin on your incisions after surgery until advised by your surgeon because they can cause removal of the dermabond glue on your incisions.     8. You may experience vaginal spotting after surgery or when the stitches at the top of the vagina begin to dissolve.  The spotting is normal but if you experience heavy bleeding, call our office.   9. Take Tylenol or ibuprofen first for pain if you are able to take these medications and only use narcotic pain medication for severe pain not relieved by the Tylenol or Ibuprofen.  Monitor your Tylenol intake to a max of 4,000 mg in a 24 hour period. You can alternate these medications after  surgery.   Diet: 1. Low sodium Heart Healthy Diet is recommended but you are cleared to resume your normal (before surgery) diet after your procedure.   2. It is safe to use a laxative, such as Miralax or Colace, if you have difficulty moving your bowels. You have been prescribed Sennakot-S to take at bedtime every evening after surgery to keep bowel movements regular and to prevent constipation.     Wound Care: 1. Keep clean and dry.  Shower daily.   Reasons to call the Doctor: Fever - Oral temperature greater than 100.4 degrees Fahrenheit Foul-smelling vaginal discharge Difficulty urinating Nausea and vomiting Increased pain at the site of the incision that is unrelieved with pain medicine. Difficulty breathing with or without chest pain New calf pain especially if only on one side Sudden, continuing increased vaginal bleeding with or without clots.   Contacts: For questions or concerns you should contact:   Dr. Eugene Garnet at [phone]   Warner Mccreedy, NP at [phone]   After Hours: call [phone] and have the GYN Oncologist paged/contacted (after 5 pm or on the weekends). You will speak with an after hours RN and let he or she know you have had surgery.   Messages sent via mychart are for non-urgent matters and are not responded to after  hours so for urgent needs, please call the after hours number.

## 2023-02-27 ENCOUNTER — Telehealth: Payer: Self-pay | Admitting: *Deleted

## 2023-02-27 ENCOUNTER — Encounter (HOSPITAL_COMMUNITY): Payer: Self-pay | Admitting: Gynecologic Oncology

## 2023-02-27 NOTE — Telephone Encounter (Signed)
Spoke with Ms. Hudman this morning. She states she is eating, drinking and urinating well. She has not had a BM yet but is passing gas. She is taking senokot as prescribed and encouraged her to drink plenty of water. She denies fever or chills. Incisions are dry and intact. She rates her pain 2/10. Her pain is controlled with tramadol.    Instructed to call office with any fever, chills, purulent drainage, uncontrolled pain or any other questions or concerns. Patient verbalizes understanding.   Pt aware of post op appointments as well as the office number [phone] and after hours number [phone] to call if she has any questions or concerns

## 2023-03-03 LAB — SURGICAL PATHOLOGY

## 2023-03-04 ENCOUNTER — Other Ambulatory Visit: Payer: Self-pay | Admitting: Genetic Counselor

## 2023-03-04 ENCOUNTER — Encounter: Payer: Self-pay | Admitting: Genetic Counselor

## 2023-03-04 ENCOUNTER — Inpatient Hospital Stay: Payer: Medicare PPO | Attending: Gynecologic Oncology | Admitting: Genetic Counselor

## 2023-03-04 ENCOUNTER — Inpatient Hospital Stay: Payer: Medicare PPO

## 2023-03-04 DIAGNOSIS — Z1379 Encounter for other screening for genetic and chromosomal anomalies: Secondary | ICD-10-CM

## 2023-03-04 DIAGNOSIS — Z803 Family history of malignant neoplasm of breast: Secondary | ICD-10-CM | POA: Diagnosis not present

## 2023-03-04 DIAGNOSIS — C541 Malignant neoplasm of endometrium: Secondary | ICD-10-CM

## 2023-03-04 DIAGNOSIS — Z8049 Family history of malignant neoplasm of other genital organs: Secondary | ICD-10-CM

## 2023-03-04 DIAGNOSIS — Z800 Family history of malignant neoplasm of digestive organs: Secondary | ICD-10-CM | POA: Diagnosis not present

## 2023-03-04 LAB — GENETIC SCREENING ORDER

## 2023-03-04 NOTE — Progress Notes (Signed)
REFERRING PROVIDER: Eugene Garnet, MD  PRIMARY PROVIDER:  Jeani Sow, MD  PRIMARY REASON FOR VISIT:  Encounter Diagnoses  Name Primary?   Endometrial cancer (HCC) Yes   Family history of breast cancer    Family history of colon cancer    Family history of uterine cancer    HISTORY OF PRESENT ILLNESS:   Crystal Bauer, a 67 y.o. female, was seen for a [facility] cancer genetics consultation at the request of Dr. Pricilla Holm due to a personal and family history of cancer.  Crystal Bauer presents to clinic today to discuss the possibility of a hereditary predisposition to cancer, to discuss genetic testing, and to further clarify her future cancer risks, as well as potential cancer risks for family members.   Crystal Bauer was diagnosed with stage I low-grade endometrioid endometrial adenocarcinoma at age 15. She has since had a TAH/BSO. There is no MSI/IHC report at this time.   RISK FACTORS:  Menarche was at age 67.  First live birth at age 73.  OCP use for approximately 6-8 years.  Ovaries intact: no.  Uterus intact: no.  Menopausal status: postmenopausal.  HRT use: 0 years. Colonoscopy: yes;  1 hyperplastic polyp on 2014 colonoscopy . Mammogram within the last year: yes. Number of breast biopsies: 0. Any excessive radiation exposure in the past: no  Past Medical History:  Diagnosis Date   Arthritis    Atrial fibrillation (HCC) 05/20/1998   from stress/increased caffeine use, resolved   Cancer (HCC)    Cataract    Dysrhythmia    GERD (gastroesophageal reflux disease)    Hearing loss 05/20/2010   left ear   History of chicken pox    Hyperplastic colon polyp    Macroprolactinemia    Orthostatic hypotension    Palpitation    Pituitary adenoma (HCC)    micro   Pneumonia     Past Surgical History:  Procedure Laterality Date   CATARACT EXTRACTION     ROBOTIC ASSISTED TOTAL HYSTERECTOMY WITH BILATERAL SALPINGO OOPHERECTOMY Bilateral 02/26/2023   Procedure: XI ROBOTIC  ASSISTED TOTAL HYSTERECTOMY WITH BILATERAL SALPINGO OOPHORECTOMY;  Surgeon: Carver Fila, MD;  Location: [facility];  Service: Gynecology;  Laterality: Bilateral;   SENTINEL NODE BIOPSY N/A 02/26/2023   Procedure: SENTINEL NODE BIOPSY;  Surgeon: Carver Fila, MD;  Location: [facility];  Service: Gynecology;  Laterality: N/A;   TONSILLECTOMY AND ADENOIDECTOMY  1965    Social History   Socioeconomic History   Marital status: Divorced    Spouse name: Not on file   Number of children: 3   Years of education: 12+   Highest education level: Bachelor's degree (e.g., BA, AB, BS)  Occupational History   Occupation: Teacher, 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  Tobacco Use   Smoking status: Former    Types: E-cigarettes   Smokeless tobacco: Never  Vaping Use   Vaping status: Never Used  Substance and Sexual Activity   Alcohol use: No   Drug use: No   Sexual activity: Not Currently    Birth control/protection: Abstinence  Other Topics Concern   Not on file  Social History Narrative   Regular exercise-noCaffeine Use-yes      6 grands   retired   International aid/development worker of Corporate investment banker Strain: Low Risk  (02/19/2023)   Overall Financial Resource Strain (CARDIA)    Difficulty of Paying Living Expenses: Not very hard  Food Insecurity: No Food  Insecurity (02/19/2023)   Hunger Vital Sign    Worried About Running Out of Food in the Last Year: Never true    Ran Out of Food in the Last Year: Never true  Transportation Needs: No Transportation Needs (02/19/2023)   PRAPARE - Administrator, Civil Service (Medical): No    Lack of Transportation (Non-Medical): No  Physical Activity: Insufficiently Active (02/19/2023)   Exercise Vital Sign    Days of Exercise per Week: 7 days    Minutes of Exercise per Session: 20 min  Stress: No Stress Concern Present (02/19/2023)   [name] of Occupational Health - Occupational Stress Questionnaire     Feeling of Stress : Not at all  Social Connections: Socially Isolated (02/19/2023)   Social Connection and Isolation Panel [NHANES]    Frequency of Communication with Friends and Family: More than three times a week    Frequency of Social Gatherings with Friends and Family: More than three times a week    Attends Religious Services: Never    Database administrator or Organizations: No    Attends Engineer, structural: Not on file    Marital Status: Divorced     FAMILY HISTORY:  We obtained a detailed, 4-generation family history.  Significant diagnoses are listed below: Family History  Problem Relation Age of Onset   Colon cancer Mother 73   Stroke Mother 59       10/18/2017   Hyperlipidemia Mother    Breast cancer Mother 68 - 81   Early death Father    Arthritis Sister    Diabetes Brother        Type 2 Diabetes   Thyroid cancer Maternal Aunt    Esophageal cancer Maternal Uncle    Diabetes Maternal Grandmother    Hearing loss Maternal Grandmother    Uterine cancer Maternal Grandmother 1 - 79   Stomach cancer Neg Hx          Crystal Bauer's mother was diagnosed with breast cancer in her 40s, she had a lumpectomy and radiation as her treatment, her mother was later diagnosed with colon cancer at age 42, she died at age 68. Crystal Bauer's maternal uncle was diagnosed with esophageal cancer at an unknown age, he died at age 27 and her maternal aunt was diagnosed with thyroid cancer at an unknown age, she is currently 51. Her maternal grandmother was diagnosed with uterine cancer at age 72, she died at age 74 and her maternal grandfather was diagnosed with leukemia at age 28, he died at age 22. Of note, she has limited information about her paternal family medical history. Ms. Ricard is unaware of previous family history of genetic testing for hereditary cancer risks. There is no reported Ashkenazi Jewish ancestry.   GENETIC COUNSELING ASSESSMENT: Crystal Bauer is a 67 y.o. female  with a personal and family history of cancer which is somewhat suggestive of a hereditary predisposition to cancer. We, therefore, discussed and recommended the following at today's visit.   DISCUSSION: We discussed that 5 - 10% of cancer is hereditary, with most cases of uterine cancer associated with Lynch Syndrome.  There are other genes that can be associated with hereditary uterine cancer syndromes.  We discussed that testing is beneficial for several reasons including knowing how to follow individuals after completing their treatment and understanding if other family members could be at risk for cancer and allowing them to undergo genetic testing.   We reviewed the characteristics, features  and inheritance patterns of hereditary cancer syndromes. We also discussed genetic testing, including the appropriate family members to test, the process of testing, insurance coverage and turn-around-time for results. We discussed the implications of a negative, positive, carrier and/or variant of uncertain significant result. We recommended Crystal Bauer pursue genetic testing for a panel that includes genes associated with uterine, breast, colon, and thyroid cancer.   Crystal Bauer  was offered a common hereditary cancer panel (34 genes+thyroid cancer genes) and an expanded pan-cancer panel (71 genes). Crystal Bauer was informed of the benefits and limitations of each panel, including that expanded pan-cancer panels contain genes that do not have clear management guidelines at this point in time.  We also discussed that as the number of genes included on a panel increases, the chances of variants of uncertain significance increases. After considering the benefits and limitations of each gene panel, Crystal Bauer elected to have [facility] CancerNext-Expanded Panel.  The CancerNext-Expanded gene panel offered by [facility] and includes sequencing, rearrangement, and RNA analysis for the following 71 genes: AIP, ALK, APC, ATM,  AXIN2, BAP1, BARD1, BMPR1A, BRCA1, BRCA2, BRIP1, CDC73, CDH1, CDK4, CDKN1B, CDKN2A, CHEK2, CTNNA1, DICER1, FH, FLCN, KIF1B, LZTR1, MAX, MEN1, MET, MLH1, MSH2, MSH3, MSH6, MUTYH, NF1, NF2, NTHL1, PALB2, PHOX2B, PMS2, POT1, PRKAR1A, PTCH1, PTEN, RAD51C, RAD51D, RB1, RET, SDHA, SDHAF2, SDHB, SDHC, SDHD, SMAD4, SMARCA4, SMARCB1, SMARCE1, STK11, SUFU, TMEM127, TP53, TSC1, TSC2, and VHL (sequencing and deletion/duplication); EGFR, EGLN1, HOXB13, KIT, MITF, PDGFRA, POLD1, and POLE (sequencing only); EPCAM and GREM1 (deletion/duplication only).   Based on Crystal Bauer's personal and family history of cancer, she meets medical criteria for genetic testing. Despite that she meets criteria, she may still have an out of pocket cost. We discussed that if her out of pocket cost for testing is over $100, the laboratory should contact them to discuss self-pay prices, patient pay assistance programs, if applicable, and other billing options.  PLAN: After considering the risks, benefits, and limitations, Crystal Bauer provided informed consent to pursue genetic testing and the blood sample was sent to [facility] for analysis of the CancerNext-Expanded Panel. Results should be available within approximately 2-3 weeks' time, at which point they will be disclosed by telephone to Crystal Bauer, as will any additional recommendations warranted by these results. Crystal Bauer will receive a summary of her genetic counseling visit and a copy of her results once available. This information will also be available in Epic.   Crystal Bauer questions were answered to her satisfaction today. Our contact information was provided should additional questions or concerns arise. Thank you for the referral and allowing [place] to share in the care of your patient.   Lalla Brothers, MS, [facility] Genetic Counselor [email or web address] (P) [phone]  The patient was seen for a total of 35 minutes in face-to-face genetic counseling.  The  patient brought her sister. Drs. Pamelia Hoit and/or Mosetta Putt were available to discuss this case as needed.   _______________________________________________________________________ For Office Staff:  Number of people involved in session: 2 Was an Intern/ student involved with case: yes, Asher Muir observed

## 2023-03-05 ENCOUNTER — Encounter: Payer: Self-pay | Admitting: Urology

## 2023-03-06 ENCOUNTER — Inpatient Hospital Stay: Payer: Medicare PPO | Admitting: Gynecologic Oncology

## 2023-03-06 ENCOUNTER — Encounter: Payer: Self-pay | Admitting: Gynecologic Oncology

## 2023-03-06 DIAGNOSIS — C541 Malignant neoplasm of endometrium: Secondary | ICD-10-CM

## 2023-03-06 DIAGNOSIS — Z7189 Other specified counseling: Secondary | ICD-10-CM

## 2023-03-06 NOTE — Progress Notes (Signed)
Gynecologic Oncology Telehealth Note: Gyn-Onc  I connected with Crystal Bauer on 03/06/23 at  4:30 PM EDT by telephone and verified that I am speaking with the correct person using two identifiers.  I discussed the limitations, risks, security and privacy concerns of performing an evaluation and management service by telemedicine and the availability of in-person appointments. I also discussed with the patient that there may be a patient responsible charge related to this service. The patient expressed understanding and agreed to proceed.  Other persons participating in the visit and their role in the encounter: none.  Patient's location: home Provider's location: [facility]  Reason for Visit: follow-up  Treatment History: She was seen initially in July with PMB. She was undergoing work-up with Urology for hematuria, CT showed endometrial thickening.  She underwent saline infusion sonogram on 8/21 confirming thickened endometrium.    Biopsy was performed on 01/08/23 and pathology revealed endometrioid adenocarcinoma, FIGO grade 1.  02/26/23: Robotic-assisted laparoscopic total hysterectomy with bilateral salpingoophorectomy, SLN biopsy bilaterally   Interval History: Doing well, recovery going very well. Just some soreness with movement. Bowels are moving well, back normal. Denies urinary symptoms, vaginal bleeding.   Past Medical/Surgical History: Past Medical History:  Diagnosis Date   Arthritis    Atrial fibrillation (HCC) 05/20/1998   from stress/increased caffeine use, resolved   Cancer (HCC)    Cataract    Dysrhythmia    GERD (gastroesophageal reflux disease)    Hearing loss 05/20/2010   left ear   History of chicken pox    Hyperplastic colon polyp    Macroprolactinemia    Orthostatic hypotension    Palpitation    Pituitary adenoma (HCC)    micro   Pneumonia     Past Surgical History:  Procedure Laterality Date   CATARACT EXTRACTION     ROBOTIC ASSISTED TOTAL  HYSTERECTOMY WITH BILATERAL SALPINGO OOPHERECTOMY Bilateral 02/26/2023   Procedure: XI ROBOTIC ASSISTED TOTAL HYSTERECTOMY WITH BILATERAL SALPINGO OOPHORECTOMY;  Surgeon: Carver Fila, MD;  Location: [facility];  Service: Gynecology;  Laterality: Bilateral;   SENTINEL NODE BIOPSY N/A 02/26/2023   Procedure: SENTINEL NODE BIOPSY;  Surgeon: Carver Fila, MD;  Location: [facility];  Service: Gynecology;  Laterality: N/A;   TONSILLECTOMY AND ADENOIDECTOMY  1965    Family History  Problem Relation Age of Onset   Colon cancer Mother 8   Stroke Mother 25       10/18/2017   Hyperlipidemia Mother    Breast cancer Mother 53 - 69   Early death Father    Arthritis Sister    Diabetes Brother        Type 2 Diabetes   Thyroid cancer Maternal Aunt    Esophageal cancer Maternal Uncle    Diabetes Maternal Grandmother    Hearing loss Maternal Grandmother    Uterine cancer Maternal Grandmother 44 - 67   Stomach cancer Neg Hx     Social History   Socioeconomic History   Marital status: Divorced    Spouse name: Not on file   Number of children: 3   Years of education: 12+   Highest education level: Bachelor's degree (e.g., BA, AB, BS)  Occupational History   Occupation: Teacher, 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  Tobacco Use   Smoking status: Former    Types: E-cigarettes   Smokeless tobacco: Never  Vaping Use   Vaping status: Never Used  Substance and Sexual Activity   Alcohol use: No  Drug use: No   Sexual activity: Not Currently    Birth control/protection: Abstinence  Other Topics Concern   Not on file  Social History Narrative   Regular exercise-noCaffeine Use-yes      6 grands   retired   International aid/development worker of Corporate investment banker Strain: Low Risk  (02/19/2023)   Overall Financial Resource Strain (CARDIA)    Difficulty of Paying Living Expenses: Not very hard  Food Insecurity: No Food Insecurity (02/19/2023)   Hunger Vital Sign     Worried About Running Out of Food in the Last Year: Never true    Ran Out of Food in the Last Year: Never true  Transportation Needs: No Transportation Needs (02/19/2023)   PRAPARE - Administrator, Civil Service (Medical): No    Lack of Transportation (Non-Medical): No  Physical Activity: Insufficiently Active (02/19/2023)   Exercise Vital Sign    Days of Exercise per Week: 7 days    Minutes of Exercise per Session: 20 min  Stress: No Stress Concern Present (02/19/2023)   [name] of Occupational Health - Occupational Stress Questionnaire    Feeling of Stress : Not at all  Social Connections: Socially Isolated (02/19/2023)   Social Connection and Isolation Panel [NHANES]    Frequency of Communication with Friends and Family: More than three times a week    Frequency of Social Gatherings with Friends and Family: More than three times a week    Attends Religious Services: Never    Database administrator or Organizations: No    Attends Engineer, structural: Not on file    Marital Status: Divorced    Current Medications:  Current Outpatient Medications:    Cholecalciferol 25 MCG (1000 UT) capsule, Take 1,000 Units by mouth daily., Disp: , Rfl:    Cyanocobalamin (B-12) 1000 MCG CAPS, Take 1,000 Units by mouth daily., Disp: , Rfl:    Magnesium 400 MG CAPS, Take 400 mg by mouth daily., Disp: , Rfl:    pantoprazole (PROTONIX) 40 MG tablet, Increase Pantoprazole to 40 mg twice daily before meals, Disp: 60 tablet, Rfl: 3   senna-docusate (SENOKOT-S) 8.6-50 MG tablet, Take 2 tablets by mouth at bedtime. For AFTER surgery, do not take if having diarrhea, Disp: 30 tablet, Rfl: 0   traMADol (ULTRAM) 50 MG tablet, Take 1 tablet (50 mg total) by mouth every 6 (six) hours as needed for severe pain. For AFTER surgery only, do not take and drive, Disp: 10 tablet, Rfl: 0  Current Facility-Administered Medications:    0.9 %  sodium chloride infusion, 500 mL, Intravenous, Once,  [name], [name], MD  Review of Symptoms: Pertinent positives as per HPI.  Physical Exam: Deferred given limitations of phone visit.  Laboratory & Radiologic Studies: A. LEFT OBTURATOR SENTINEL LYMPH NODE, EXCISION: One lymph node negative for metastatic carcinoma (0/1)  B. RIGHT OBTURATOR SENTINEL LYMPH NODE, EXCISION: One lymph node negative for metastatic carcinoma (0/1)  C. UTERUS, CERVIX, BILATERAL FALLOPIAN TUBES, AND OVARIES, RESECTION: Endometrioid adenocarcinoma, FIGO grade 1 Carcinoma invades myometrium 12 mm in 28 mm myometrium (45%) Cervix, bilateral ovaries and bilateral fallopian tubes negative for carcinoma Negative for lymphovascular involvement Left fallopian tube with focal atypical hyperplasia (see comment) See oncology table and comment  D. RIGHT INTERNAL ILIAC SENTINEL LYMPH NODE, EXCISION: Benign adipose tissue Negative for lymph node tissue or malignancy  ONCOLOGY TABLE: UTERUS, CARCINOMA OR CARCINOSARCOMA: Resection Procedure: Total hysterectomy with bilateral salpingo-oophorectomy and sentinel lymph node biopsies  Histologic Type: Endometrioid Histologic Grade: FIGO grade 1, see comment Myometrial Invasion:      Depth of Myometrial Invasion (mm): 12 mm      Myometrial Thickness (mm): 28 mm      Percentage of Myometrial Invasion: 45% Uterine Serosa Involvement: Not identified Cervical stromal Involvement: Not identified Extent of involvement of other tissue/organs: Not identified Peritoneal/Ascitic Fluid: Not submitted Lymphovascular Invasion: Not identified Regional Lymph Nodes: Pelvic Lymph Nodes Examined: 2 Sentinel 0 non-sentinel 2 total Pelvic Lymph Nodes with Metastasis: 0                         Macrometastasis: (>2.0 mm): 0                         Micrometastasis: (>0.2 mm and < 2.0 mm): 0                         Isolated Tumor Cells (<0.2 mm): 0 Distant Metastasis: Distant Site(s) Involved: Not at Pathologic Stage Classification  (pTNM, AJCC 8th Edition): pT1a, pN0 Ancillary Studies: MMR performed on previous endometrial biopsy ([id number]) Representative Tumor Block: C1, C3, C5 and C6 Comment(s): The carcinoma has morphologic features typical of endometrioid adenocarcinoma.  There is a focal solid fused gland component which is less than 5% of the tumor. The left fallopian tube focally has a glandular proliferation with atypical features. Immunohistochemistry for p53 and Ki-67 is performed and there is insufficient residual atypical glandular proliferation for immunohistochemistry.  There is no background p53 mutation pattern in the fallopian tube epithelium and the findings most likely represent atypical tubal epithelial hyperplasia. Cytokeratin AE1/AE3 performed on sentinel lymph nodes (parts A and B) is negative for metastatic carcinoma. (v4.2.0.1)  GROSS DESCRIPTION: Specimen A: Received fresh is a 1.8 x 0.7 x 0.4 cm yellow-pink soft to rubbery nodule which is sectioned and entirely submitted in 1 block.  Specimen B: Received fresh is a 1.4 x 0.7 x 0.6 cm yellow-pink soft to rubbery nodule which is sectioned and submitted 1 block.  Specimen C: Uterus including cervix, bilateral fallopian tubes and ovaries, received fresh. Specimen integrity: Intact Size and shape: The uterine body is symmetrical, 6.2 x 5.5 x 4.8 cm. Weight: 101 g without adnexa Serosa: Tan-pink to pink-red, smooth Cervix: 3.2 cm in length, 3 cm in diameter, is a pink-red to slightly blue stained smooth ectocervix and smooth endocervix. Endometrium: Endometrial cavity is 3.7 cm in length and up to 3.5 cm in width.  Involving the entire posterior wall is a 3.7 x 3.5 cm tan-white soft to firm and focally friable well-defined exophytic mass which is up to 1.7 cm thick.  Anterior endometrium is tan-pink and varies from smooth to vaguely nodular. Myometrium: The endometrial mass grossly invades 0.5 cm into an area of myometrium measuring  2.5 cm total thickness.  There is no gross outer half involvement.  Anterior myometrium is up to 2.8 cm thick, tan-pink to hyperemic and diffusely nodular.  There is also a 1.1 cm tan-white well-defined intramural mass which has a whorled cut surface. Right adnexa: The right fallopian tube is a 7 cm in length and 0.6 cm in diameter fimbriated segment which has a pink-purple smooth serosa and unremarkable cut surfaces.  The right ovary is 3.3 x 2.8 x 2.6 cm, has a tan-pink smooth surface and consists almost entirely of a smooth serous fluid-filled smooth lined cyst with an adjacent 1.2  cm smooth clear mucoid cyst. Left adnexa: The left fallopian tube is a 7.3 cm in length and 0.5 cm in diameter fimbriated segment which has a pink-purple smooth serosa and unremarkable cut surfaces.  The left ovary is 2.6 x 1.5 x 0.8 cm, has a yellow-pink wrinkled surface and unremarkable cut surfaces. Block Summary: Blocks 1, 2 and 3, 4 = 2 (halved) full-thickness sections of endometrial mass with grossly deepest invasion Blocks 5, 6 = endometrial mass, inner half sections Block 7 = section of anterior endomyometrium Block 8 = intramural nodule Block 9 = cervix Block 10 = sections of cystic right ovary and cross-section of right fallopian tube Block 11 = fimbriated end of right fallopian tube, bisected lengthwise Block 12 = sections of left tube and ovary Block 13 = fimbriated end of left fallopian tube, bisected lengthwise  Specimen D: Received fresh is a 1.4 x 1 x 0.2 cm soft fatty tissue. Lymphoid tissue is not identified.  The specimen is sectioned and entirely submitted 1 block.   Assessment & Plan: Crystal Bauer is a 67 y.o. 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  Doing well, meeting milestones. Discussed continued expectations and restrictions. Reviewed pathology from surgery - she is happy with this news.  I discussed the  assessment and treatment plan with the patient. The patient was provided with an opportunity to ask questions and all were answered. The patient agreed with the plan and demonstrated an understanding of the instructions.   The patient was advised to call back or see an in-person evaluation if the symptoms worsen or if the condition fails to improve as anticipated.   6 minutes of total time was spent for this patient encounter, including preparation, phone counseling with the patient and coordination of care, and documentation of the encounter.   Eugene Garnet, MD  Division of Gynecologic Oncology  Department of Obstetrics and Gynecology  [facility]

## 2023-03-20 ENCOUNTER — Telehealth: Payer: Self-pay | Admitting: Oncology

## 2023-03-20 NOTE — Progress Notes (Signed)
Gynecologic Oncology Return Clinic Visit  03/21/23  Reason for Visit: follow-up  Treatment History: She was seen initially in July with PMB. She was undergoing work-up with Urology for hematuria, CT showed endometrial thickening.  She underwent saline infusion sonogram on 8/21 confirming thickened endometrium.    Biopsy was performed on 01/08/23 and pathology revealed endometrioid adenocarcinoma, FIGO grade 1.   02/26/23: Robotic-assisted laparoscopic total hysterectomy with bilateral salpingoophorectomy, SLN biopsy bilaterally  Interval History: Doing well.  Denies any abdominal or pelvic pain.  Reports improving bowel function, denies any urinary symptoms.  Denies any vaginal bleeding.  Past Medical/Surgical History: Past Medical History:  Diagnosis Date   Arthritis    Atrial fibrillation (HCC) 05/20/1998   from stress/increased caffeine use, resolved   Cancer (HCC)    Cataract    Dysrhythmia    GERD (gastroesophageal reflux disease)    Hearing loss 05/20/2010   left ear   History of chicken pox    Hyperplastic colon polyp    Macroprolactinemia    Orthostatic hypotension    Palpitation    Pituitary adenoma (HCC)    micro   Pneumonia     Past Surgical History:  Procedure Laterality Date   CATARACT EXTRACTION     ROBOTIC ASSISTED TOTAL HYSTERECTOMY WITH BILATERAL SALPINGO OOPHERECTOMY Bilateral 02/26/2023   Procedure: XI ROBOTIC ASSISTED TOTAL HYSTERECTOMY WITH BILATERAL SALPINGO OOPHORECTOMY;  Surgeon: Carver Fila, MD;  Location: [facility];  Service: Gynecology;  Laterality: Bilateral;   SENTINEL NODE BIOPSY N/A 02/26/2023   Procedure: SENTINEL NODE BIOPSY;  Surgeon: Carver Fila, MD;  Location: [facility];  Service: Gynecology;  Laterality: N/A;   TONSILLECTOMY AND ADENOIDECTOMY  1965    Family History  Problem Relation Age of Onset   Colon cancer Mother 6   Stroke Mother 30       10/18/2017   Hyperlipidemia Mother    Breast cancer Mother 67 - 34   Early  death Father    Arthritis Sister    Diabetes Brother        Type 2 Diabetes   Thyroid cancer Maternal Aunt    Esophageal cancer Maternal Uncle    Diabetes Maternal Grandmother    Hearing loss Maternal Grandmother    Uterine cancer Maternal Grandmother 63 - 72   Stomach cancer Neg Hx     Social History   Socioeconomic History   Marital status: Divorced    Spouse name: Not on file   Number of children: 3   Years of education: 12+   Highest education level: Bachelor's degree (e.g., BA, AB, BS)  Occupational History   Occupation: Teacher, 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  Tobacco Use   Smoking status: Former    Types: E-cigarettes   Smokeless tobacco: Never  Vaping Use   Vaping status: Never Used  Substance and Sexual Activity   Alcohol use: No   Drug use: No   Sexual activity: Not Currently    Birth control/protection: Abstinence  Other Topics Concern   Not on file  Social History Narrative   Regular exercise-noCaffeine Use-yes      6 grands   retired   International aid/development worker of Corporate investment banker Strain: Low Risk  (02/19/2023)   Overall Financial Resource Strain (CARDIA)    Difficulty of Paying Living Expenses: Not very hard  Food Insecurity: No Food Insecurity (02/19/2023)   Hunger Vital Sign    Worried About Running Out of Food  in the Last Year: Never true    Ran Out of Food in the Last Year: Never true  Transportation Needs: No Transportation Needs (02/19/2023)   PRAPARE - Administrator, Civil Service (Medical): No    Lack of Transportation (Non-Medical): No  Physical Activity: Insufficiently Active (02/19/2023)   Exercise Vital Sign    Days of Exercise per Week: 7 days    Minutes of Exercise per Session: 20 min  Stress: No Stress Concern Present (02/19/2023)   [name] of Occupational Health - Occupational Stress Questionnaire    Feeling of Stress : Not at all  Social Connections: Socially Isolated  (02/19/2023)   Social Connection and Isolation Panel [NHANES]    Frequency of Communication with Friends and Family: More than three times a week    Frequency of Social Gatherings with Friends and Family: More than three times a week    Attends Religious Services: Never    Database administrator or Organizations: No    Attends Engineer, structural: Not on file    Marital Status: Divorced    Current Medications:  Current Outpatient Medications:    Cholecalciferol 25 MCG (1000 UT) capsule, Take 1,000 Units by mouth daily., Disp: , Rfl:    conjugated estrogens (PREMARIN) vaginal cream, Place 1 Applicatorful vaginally 3 (three) times a week., Disp: 42.5 g, Rfl: 1   Cyanocobalamin (B-12) 1000 MCG CAPS, Take 1,000 Units by mouth daily., Disp: , Rfl:    Magnesium 400 MG CAPS, Take 400 mg by mouth daily., Disp: , Rfl:    pantoprazole (PROTONIX) 40 MG tablet, Increase Pantoprazole to 40 mg twice daily before meals, Disp: 60 tablet, Rfl: 3   senna-docusate (SENOKOT-S) 8.6-50 MG tablet, Take 2 tablets by mouth at bedtime. For AFTER surgery, do not take if having diarrhea, Disp: 30 tablet, Rfl: 0  Current Facility-Administered Medications:    0.9 %  sodium chloride infusion, 500 mL, Intravenous, Once, [name], [name], MD  Review of Systems: Denies appetite changes, fevers, chills, fatigue, unexplained weight changes. Denies hearing loss, neck lumps or masses, mouth sores, ringing in ears or voice changes. Denies cough or wheezing.  Denies shortness of breath. Denies chest pain or palpitations. Denies leg swelling. Denies abdominal distention, pain, blood in stools, constipation, diarrhea, nausea, vomiting, or early satiety. Denies pain with intercourse, dysuria, frequency, hematuria or incontinence. Denies hot flashes, pelvic pain, vaginal bleeding or vaginal discharge.   Denies joint pain, back pain or muscle pain/cramps. Denies itching, rash, or wounds. Denies dizziness, headaches,  numbness or seizures. Denies swollen lymph nodes or glands, denies easy bruising or bleeding. Denies anxiety, depression, confusion, or decreased concentration.  Physical Exam: BP (!) 96/58 (BP Location: Right Arm, Patient Position: Sitting)   Pulse 66   Temp 98.8 F (37.1 C) (Oral)   Resp 16   Ht 5\' 8"  (1.727 m)   Wt 200 lb 6.4 oz (90.9 kg)   SpO2 97%   BMI 30.47 kg/m  General: Alert, oriented, no acute distress. HEENT: Normocephalic, atraumatic, sclera anicteric. Chest: Unlabored breathing on room air. Abdomen: soft, nontender.  Normoactive bowel sounds.  No masses or hepatosplenomegaly appreciated.  Well-healed incisions. Extremities: Grossly normal range of motion.  Warm, well perfused.  No edema bilaterally. Skin: No rashes or lesions noted. GU: Normal appearing external genitalia without erythema, excoriation, or lesions.  Speculum exam reveals cuff intact, suture visible, no discharge or bleeding.  Patient has discomfort with insertion of the speculum, similar to prior  to surgery.  Bimanual exam reveals cuff intact, no fluctuance.  Patient is uncomfortable but the exam but is much with initial insertion as with palpation along the cuff.    Laboratory & Radiologic Studies: A. LEFT OBTURATOR SENTINEL LYMPH NODE, EXCISION: One lymph node negative for metastatic carcinoma (0/1)  B. RIGHT OBTURATOR SENTINEL LYMPH NODE, EXCISION: One lymph node negative for metastatic carcinoma (0/1)  C. UTERUS, CERVIX, BILATERAL FALLOPIAN TUBES, AND OVARIES, RESECTION: Endometrioid adenocarcinoma, FIGO grade 1 Carcinoma invades myometrium 12 mm in 28 mm myometrium (45%) Cervix, bilateral ovaries and bilateral fallopian tubes negative for carcinoma Negative for lymphovascular involvement Left fallopian tube with focal atypical hyperplasia (see comment) See oncology table and comment  D. RIGHT INTERNAL ILIAC SENTINEL LYMPH NODE, EXCISION: Benign adipose tissue Negative for lymph node tissue  or malignancy  ONCOLOGY TABLE: UTERUS, CARCINOMA OR CARCINOSARCOMA: Resection Procedure: Total hysterectomy with bilateral salpingo-oophorectomy and sentinel lymph node biopsies Histologic Type: Endometrioid Histologic Grade: FIGO grade 1, see comment Myometrial Invasion:      Depth of Myometrial Invasion (mm): 12 mm      Myometrial Thickness (mm): 28 mm      Percentage of Myometrial Invasion: 45% Uterine Serosa Involvement: Not identified Cervical stromal Involvement: Not identified Extent of involvement of other tissue/organs: Not identified Peritoneal/Ascitic Fluid: Not submitted Lymphovascular Invasion: Not identified Regional Lymph Nodes: Pelvic Lymph Nodes Examined: 2 Sentinel 0 non-sentinel 2 total Pelvic Lymph Nodes with Metastasis: 0                         Macrometastasis: (>2.0 mm): 0                         Micrometastasis: (>0.2 mm and < 2.0 mm): 0                         Isolated Tumor Cells (<0.2 mm): 0 Distant Metastasis: Distant Site(s) Involved: Not at Pathologic Stage Classification (pTNM, AJCC 8th Edition): pT1a, pN0 Ancillary Studies: MMR performed on previous endometrial biopsy ([id number]) Representative Tumor Block: C1, C3, C5 and C6 Comment(s): The carcinoma has morphologic features typical of endometrioid adenocarcinoma.  There is a focal solid fused gland component which is less than 5% of the tumor. The left fallopian tube focally has a glandular proliferation with atypical features. Immunohistochemistry for p53 and Ki-67 is performed and there is insufficient residual atypical glandular proliferation for immunohistochemistry.  There is no background p53 mutation pattern in the fallopian tube epithelium and the findings most likely represent atypical tubal epithelial hyperplasia. Cytokeratin AE1/AE3 performed on sentinel lymph nodes (parts A and B) is negative for metastatic carcinoma. (v4.2.0.1)   Assessment & Plan: Crystal Bauer is a 68  y.o. 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  Left fallopian tube with focal atypical hyperplasia. P53 WT, MMR with loss of MLH1/PMS2.  Doing well, meeting milestones.  We discussed continued expectations and restrictions.  She is more uncomfortable with exam than I expected but this was true prior to surgery as well.  Discussed that this may be related to vaginal atrophy.  I am sending in a prescription for vaginal estrogen for her to use.  We discussed using this between now and her follow-up with me in 6 months.  We also discussed signs and symptoms that would be concerning for infection.  While she does not have any of these  today, I stressed the importance of calling if she develops these over the next couple of weeks.  Genetic testing was performed on 10/15 after she met with one of our genetic counselors.  I asked Clydie Braun to reach out to pathology to add MSI and MLH1 promoter hypermethylation testing.  I reviewed her pathology from surgery again.  She was given a copy of her pathology report.  Given early stage, low risk disease, discussed no recommendation for adjuvant therapy.  We reviewed that surveillance plan will be follow-up visits every 6 months alternating between my office and her OB/GYN.  We will plan to do her first follow-up visit in our clinic.  We reviewed signs and symptoms that would be concerning for cancer recurrence.  The patient was asked to call the office if she develops any of these between visits.  20 minutes of total time was spent for this patient encounter, including preparation, face-to-face counseling with the patient and coordination of care, and documentation of the encounter.  Eugene Garnet, MD  Division of Gynecologic Oncology  Department of Obstetrics and Gynecology  [facility]

## 2023-03-20 NOTE — Telephone Encounter (Signed)
Left a message for Katie at [facility] requesting P53 and MMR results on accession [id number].

## 2023-03-21 ENCOUNTER — Encounter: Payer: Self-pay | Admitting: Obstetrics and Gynecology

## 2023-03-21 ENCOUNTER — Encounter: Payer: Self-pay | Admitting: Oncology

## 2023-03-21 ENCOUNTER — Inpatient Hospital Stay: Payer: Medicare PPO | Attending: Gynecologic Oncology | Admitting: Gynecologic Oncology

## 2023-03-21 ENCOUNTER — Encounter: Payer: Self-pay | Admitting: Gynecologic Oncology

## 2023-03-21 VITALS — BP 96/58 | HR 66 | Temp 98.8°F | Resp 16 | Ht 68.0 in | Wt 200.4 lb

## 2023-03-21 DIAGNOSIS — Z90710 Acquired absence of both cervix and uterus: Secondary | ICD-10-CM

## 2023-03-21 DIAGNOSIS — C541 Malignant neoplasm of endometrium: Secondary | ICD-10-CM

## 2023-03-21 DIAGNOSIS — Z90722 Acquired absence of ovaries, bilateral: Secondary | ICD-10-CM

## 2023-03-21 DIAGNOSIS — Z7189 Other specified counseling: Secondary | ICD-10-CM

## 2023-03-21 DIAGNOSIS — N952 Postmenopausal atrophic vaginitis: Secondary | ICD-10-CM

## 2023-03-21 MED ORDER — ESTROGENS CONJUGATED 0.625 MG/GM VA CREA
1.0000 | TOPICAL_CREAM | VAGINAL | 1 refills | Status: DC
Start: 2023-03-21 — End: 2023-03-24

## 2023-03-21 NOTE — Progress Notes (Signed)
Requested MSI and MLH1 Hypermethylation on accession [id number] with [facility] via email.

## 2023-03-21 NOTE — Patient Instructions (Addendum)
It was good to see you today.  You are healing well from surgery.  Please remember, no heavy lifting until 6 weeks after surgery and nothing in the vagina for at least 10-12 weeks.  I am sending in a prescription for vaginal estrogen to your pharmacy.  I would like you to use this 3 times a week at night.  Please do not use the applicator initially given recent surgery.  Instead, place a small amount of the vaginal estrogen on your finger and insert this into the bottom part of the vagina before you go to sleep.  You may continue this until I see you for follow-up in 6 months.  As we discussed today, if you develop any new and concerning symptoms between now and your next visit with me, such as vaginal bleeding, pelvic pain, change to bowel or bladder function, please call to see me sooner.

## 2023-03-23 ENCOUNTER — Encounter: Payer: Self-pay | Admitting: Gynecologic Oncology

## 2023-03-24 ENCOUNTER — Other Ambulatory Visit: Payer: Self-pay | Admitting: Gynecologic Oncology

## 2023-03-24 DIAGNOSIS — N952 Postmenopausal atrophic vaginitis: Secondary | ICD-10-CM

## 2023-03-24 MED ORDER — ESTRADIOL 0.1 MG/GM VA CREA
TOPICAL_CREAM | VAGINAL | 3 refills | Status: AC
Start: 2023-03-24 — End: ?

## 2023-03-27 ENCOUNTER — Encounter: Payer: Self-pay | Admitting: Genetic Counselor

## 2023-03-27 ENCOUNTER — Telehealth: Payer: Self-pay | Admitting: Genetic Counselor

## 2023-03-27 DIAGNOSIS — Z1379 Encounter for other screening for genetic and chromosomal anomalies: Secondary | ICD-10-CM | POA: Insufficient documentation

## 2023-03-27 NOTE — Telephone Encounter (Addendum)
I contacted Crystal Bauer to discuss her genetic testing results. No pathogenic variants were identified in the 71 genes analyzed. Detailed clinic note to follow.  The test report has been scanned into EPIC and is located under the Molecular Pathology section of the Results Review tab.  A portion of the result report is included below for reference.   Crystal Brothers, MS, [facility] Genetic Counselor [email or web address] (P) [phone]

## 2023-03-31 ENCOUNTER — Encounter: Payer: Self-pay | Admitting: Genetic Counselor

## 2023-03-31 ENCOUNTER — Ambulatory Visit: Payer: Self-pay | Admitting: Genetic Counselor

## 2023-03-31 DIAGNOSIS — Z1379 Encounter for other screening for genetic and chromosomal anomalies: Secondary | ICD-10-CM

## 2023-03-31 NOTE — Progress Notes (Signed)
HPI:   Crystal Bauer was previously seen in the [facility] due to a personal and family history of cancer and concerns regarding a hereditary predisposition to cancer. Please refer to our prior cancer genetics clinic note for more information regarding our discussion, assessment and recommendations, at the time. Crystal Bauer recent genetic test results were disclosed to her, as were recommendations warranted by these results. These results and recommendations are discussed in more detail below.  CANCER HISTORY:  Crystal Bauer was diagnosed with stage I low-grade endometrioid endometrial adenocarcinoma at age 67. She has since had a TAH/BSO. There is no MSI/IHC report at this time.   FAMILY HISTORY:  We obtained a detailed, 4-generation family history.  Significant diagnoses are listed below:      Family History  Problem Relation Age of Onset   Colon cancer Mother 69   Stroke Mother 26        10/18/2017   Hyperlipidemia Mother     Breast cancer Mother 52 - 80   Early death Father     Arthritis Sister     Diabetes Brother          Type 2 Diabetes   Thyroid cancer Maternal Aunt     Esophageal cancer Maternal Uncle     Diabetes Maternal Grandmother     Hearing loss Maternal Grandmother     Uterine cancer Maternal Grandmother 7 - 79   Stomach cancer Neg Hx                     Crystal Bauer's mother was diagnosed with breast cancer in her 16s, she had a lumpectomy and radiation as her treatment, her mother was later diagnosed with colon cancer at age 70, she died at age 48. Crystal Bauer's maternal uncle was diagnosed with esophageal cancer at an unknown age, he died at age 71 and her maternal aunt was diagnosed with thyroid cancer at an unknown age, she is currently 36. Her maternal grandmother was diagnosed with uterine cancer at age 3, she died at age 41 and her maternal grandfather was diagnosed with leukemia at age 3, he died at age 25. Of note, she has limited  information about her paternal family medical history. Crystal Bauer is unaware of previous family history of genetic testing for hereditary cancer risks. There is no reported Ashkenazi Jewish ancestry.   GENETIC TEST RESULTS:  The [facility] CancerNext-Expanded Panel found no pathogenic mutations.   The CancerNext-Expanded gene panel offered by [facility] and includes sequencing, rearrangement, and RNA analysis for the following 71 genes: AIP, ALK, APC, ATM, AXIN2, BAP1, BARD1, BMPR1A, BRCA1, BRCA2, BRIP1, CDC73, CDH1, CDK4, CDKN1B, CDKN2A, CHEK2, CTNNA1, DICER1, FH, FLCN, KIF1B, LZTR1, MAX, MEN1, MET, MLH1, MSH2, MSH3, MSH6, MUTYH, NF1, NF2, NTHL1, PALB2, PHOX2B, PMS2, POT1, PRKAR1A, PTCH1, PTEN, RAD51C, RAD51D, RB1, RET, SDHA, SDHAF2, SDHB, SDHC, SDHD, SMAD4, SMARCA4, SMARCB1, SMARCE1, STK11, SUFU, TMEM127, TP53, TSC1, TSC2, and VHL (sequencing and deletion/duplication); EGFR, EGLN1, HOXB13, KIT, MITF, PDGFRA, POLD1, and POLE (sequencing only); EPCAM and GREM1 (deletion/duplication only).   The test report has been scanned into EPIC and is located under the Molecular Pathology section of the Results Review tab.  A portion of the result report is included below for reference. Genetic testing reported out on 03/20/2023.       Even though a pathogenic variant was not identified, possible explanations for the cancer in the family may include: There may be no hereditary risk for cancer in  the family. The cancers in Crystal Bauer and/or her family may be due to other genetic or environmental factors. There may be a gene mutation in one of these genes that current testing methods cannot detect, but that chance is small. There could be another gene that has not yet been discovered, or that we have not yet tested, that is responsible for the cancer diagnoses in the family.  It is also possible there is a hereditary cause for the cancer in the family that Crystal Bauer did not inherit.  Therefore, it is important  to remain in touch with cancer genetics in the future so that we can continue to offer Crystal Bauer the most up to date genetic testing.   ADDITIONAL GENETIC TESTING:  We discussed with Crystal Bauer that her genetic testing was fairly extensive.  If there are genes identified to increase cancer risk that can be analyzed in the future, we would be happy to discuss and coordinate this testing at that time.    CANCER SCREENING RECOMMENDATIONS:  Crystal Bauer test result is considered negative (normal).  This means that we have not identified a hereditary cause for her personal and family history of cancer at this time.   An individual's cancer risk and medical management are not determined by genetic test results alone. Overall cancer risk assessment incorporates additional factors, including personal medical history, family history, and any available genetic information that may result in a personalized plan for cancer prevention and surveillance. Therefore, it is recommended she continue to follow the cancer management and screening guidelines provided by her oncology and primary healthcare provider.  Based on the reported personal and family history, specific cancer screenings for Crystal Bauer and her family include:  Breast Cancer Screening:  Multiple prediction models have been developed to assist with breast cancer risk prediction efforts for unaffected women without a known single gene risk factor identified in their family. The Tyrer-Cuzick model is one such risk assessment tool. This model was developed to include extensive family history information, endogenous estrogen exposure, and benign breast disease. The calculation is highly-dependent on the accuracy of clinical data provided by the patient. Other factors not accounted for in the calculation may impact lifetime breast cancer risk including, but not limited to, germline mutations not analyzed by the ordered genetic test or clinical  information not provided at the time of the testing. The risk number provided is patient-specific and cannot be used to infer risk to relatives.  Crystal Bauer risk score is 7.9%. She is encouraged to continue to be mindful of her family history and be diligent with general population breast screening, including annual mammograms.  She is encouraged to contact us regarding any changes to her personal or family history, as her recommendations for screening would be altered significantly if her lifetime risk is determined to be greater than 20% based on updated information.    Colon Cancer Screening: Due to Crystal Bauer's mother's history of colon cancer, she is recommended to repeat colonoscopies every 5 years. More frequent colonoscopies may be recommended if polyps are identified.  RECOMMENDATIONS FOR FAMILY MEMBERS:   Since she did not inherit a mutation in a cancer predisposition gene included on this panel, her children could not have inherited a mutation from her in one of these genes. Individuals in this family might be at some increased risk of developing cancer, over the general population risk, due to the family history of cancer. We recommend women in this family have  a yearly mammogram beginning at age 24, or 23 years younger than the earliest onset of cancer, an annual clinical breast exam, and perform monthly breast self-exams.  FOLLOW-UP:  Cancer genetics is a rapidly advancing field and it is possible that new genetic tests will be appropriate for her and/or her family members in the future. We encouraged her to remain in contact with cancer genetics on an annual basis so we can update her personal and family histories and let her know of advances in cancer genetics that may benefit this family.   Our contact number was provided. Crystal Bauer questions were answered to her satisfaction, and she knows she is welcome to call us at anytime with additional questions or concerns.    Lalla Brothers, MS, [facility] Genetic Counselor [email or web address] (P) [phone]

## 2023-04-04 LAB — MOLECULAR PATHOLOGY

## 2023-04-14 ENCOUNTER — Ambulatory Visit (INDEPENDENT_AMBULATORY_CARE_PROVIDER_SITE_OTHER): Payer: Medicare PPO

## 2023-04-14 VITALS — Wt 200.0 lb

## 2023-04-14 DIAGNOSIS — Z0000 Encounter for general adult medical examination without abnormal findings: Secondary | ICD-10-CM

## 2023-04-14 DIAGNOSIS — Z1211 Encounter for screening for malignant neoplasm of colon: Secondary | ICD-10-CM

## 2023-04-14 NOTE — Progress Notes (Signed)
Subjective:   Crystal Bauer is a 67 y.o. female who presents for an Initial Medicare Annual Wellness Visit.  Visit Complete: Virtual I connected with  Crystal Bauer on 04/14/23 by a audio enabled telemedicine application and verified that I am speaking with the correct person using two identifiers.  Patient Location: Home  Provider Location: Office/Clinic  I discussed the limitations of evaluation and management by telemedicine. The patient expressed understanding and agreed to proceed.  Vital Signs: Because this visit was a virtual/telehealth visit, some criteria may be missing or patient reported. Any vitals not documented were not able to be obtained and vitals that have been documented are patient reported.  Patient Medicare AWV questionnaire was completed by the patient on 04/10/23; I have confirmed that all information answered by patient is correct and no changes since this date.  Cardiac Risk Factors include: advanced age (>43men, >39 women);obesity (BMI >30kg/m2)     Objective:    Today's Vitals   04/14/23 1433  Weight: 200 lb (90.7 kg)   Body mass index is 30.41 kg/m.     04/14/2023    2:38 PM 02/19/2023    1:14 PM 02/11/2023   11:29 AM 11/22/2019    2:09 PM  Advanced Directives  Does Patient Have a Medical Advance Directive? No No No No  Would patient like information on creating a medical advance directive? No - Patient declined  Yes (MAU/Ambulatory/Procedural Areas - Information given) Yes (ED - Information included in AVS)    Current Medications (verified) Outpatient Encounter Medications as of 04/14/2023  Medication Sig   Cholecalciferol 25 MCG (1000 UT) capsule Take 1,000 Units by mouth daily.   Cyanocobalamin (B-12) 1000 MCG CAPS Take 1,000 Units by mouth daily.   estradiol (ESTRACE VAGINAL) 0.1 MG/GM vaginal cream Use this 3 times a week at night.  Please do not use the applicator initially given recent surgery.  Instead, place a pea size amount  of the vaginal estrogen on your finger and insert this into the bottom part of the vagina before you go to sleep.   Magnesium 400 MG CAPS Take 400 mg by mouth daily.   pantoprazole (PROTONIX) 40 MG tablet Increase Pantoprazole to 40 mg twice daily before meals   senna-docusate (SENOKOT-S) 8.6-50 MG tablet Take 2 tablets by mouth at bedtime. For AFTER surgery, do not take if having diarrhea   Facility-Administered Encounter Medications as of 04/14/2023  Medication   0.9 %  sodium chloride infusion    Allergies (verified) Erythromycin, Procaine, Quinolones, and Neosporin [bacitracin-polymyxin b]   History: Past Medical History:  Diagnosis Date   Arthritis    Atrial fibrillation (HCC) 05/20/1998   from stress/increased caffeine use, resolved   Cancer (HCC)    Cataract    Dysrhythmia    GERD (gastroesophageal reflux disease)    Hearing loss 05/20/2010   left ear   History of chicken pox    Hyperplastic colon polyp    Macroprolactinemia    Orthostatic hypotension    Palpitation    Pituitary adenoma (HCC)    micro   Pneumonia    Past Surgical History:  Procedure Laterality Date   CATARACT EXTRACTION     ROBOTIC ASSISTED TOTAL HYSTERECTOMY WITH BILATERAL SALPINGO OOPHERECTOMY Bilateral 02/26/2023   Procedure: XI ROBOTIC ASSISTED TOTAL HYSTERECTOMY WITH BILATERAL SALPINGO OOPHORECTOMY;  Surgeon: Carver Fila, MD;  Location: [facility];  Service: Gynecology;  Laterality: Bilateral;   SENTINEL NODE BIOPSY N/A 02/26/2023   Procedure: SENTINEL NODE BIOPSY;  Surgeon: Carver Fila, MD;  Location: [facility];  Service: Gynecology;  Laterality: N/A;   TONSILLECTOMY AND ADENOIDECTOMY  1965   Family History  Problem Relation Age of Onset   Colon cancer Mother 35   Stroke Mother 22       10/18/2017   Hyperlipidemia Mother    Breast cancer Mother 11 - 16   Early death Father    Arthritis Sister    Diabetes Brother        Type 2 Diabetes   Thyroid cancer Maternal Aunt     Esophageal cancer Maternal Uncle    Diabetes Maternal Grandmother    Hearing loss Maternal Grandmother    Uterine cancer Maternal Grandmother 81 - 66   Stomach cancer Neg Hx    Social History   Socioeconomic History   Marital status: Divorced    Spouse name: Not on file   Number of children: 3   Years of education: 12+   Highest education level: Bachelor's degree (e.g., BA, AB, BS)  Occupational History   Occupation: Teacher, 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  Tobacco Use   Smoking status: Former    Types: E-cigarettes   Smokeless tobacco: Never  Vaping Use   Vaping status: Never Used  Substance and Sexual Activity   Alcohol use: No   Drug use: No   Sexual activity: Not Currently    Birth control/protection: Abstinence  Other Topics Concern   Not on file  Social History Narrative   Regular exercise-noCaffeine Use-yes      6 grands   retired   International aid/development worker of Corporate investment banker Strain: Low Risk  (04/10/2023)   Overall Financial Resource Strain (CARDIA)    Difficulty of Paying Living Expenses: Not hard at all  Food Insecurity: No Food Insecurity (04/10/2023)   Hunger Vital Sign    Worried About Running Out of Food in the Last Year: Never true    Ran Out of Food in the Last Year: Never true  Transportation Needs: No Transportation Needs (04/10/2023)   PRAPARE - Administrator, Civil Service (Medical): No    Lack of Transportation (Non-Medical): No  Physical Activity: Insufficiently Active (04/10/2023)   Exercise Vital Sign    Days of Exercise per Week: 7 days    Minutes of Exercise per Session: 20 min  Stress: No Stress Concern Present (04/10/2023)   [name] of Occupational Health - Occupational Stress Questionnaire    Feeling of Stress : Not at all  Social Connections: Socially Isolated (04/10/2023)   Social Connection and Isolation Panel [NHANES]    Frequency of Communication with Friends and  Family: More than three times a week    Frequency of Social Gatherings with Friends and Family: More than three times a week    Attends Religious Services: Never    Database administrator or Organizations: No    Attends Engineer, structural: Not on file    Marital Status: Divorced    Tobacco Counseling Counseling given: Not Answered   Clinical Intake:  Pre-visit preparation completed: Yes  Pain : No/denies pain     BMI - recorded: 30.41 Nutritional Status: BMI > 30  Obese Nutritional Risks: None Diabetes: No  How often do you need to have someone help you when you read instructions, pamphlets, or other written materials from your doctor or pharmacy?: 1 - Never  Interpreter Needed?: No  Information entered by ::  Lanier Ensign, LPN   Activities of Daily Living    04/10/2023    8:50 AM 02/19/2023    1:16 PM  In your present state of health, do you have any difficulty performing the following activities:  Hearing? 0   Vision? 0   Difficulty concentrating or making decisions? 0   Walking or climbing stairs? 0   Dressing or bathing? 0   Doing errands, shopping? 0 0  Preparing Food and eating ? N   Using the Toilet? N   In the past six months, have you accidently leaked urine? N   Do you have problems with loss of bowel control? N   Managing your Medications? N   Managing your Finances? N   Housekeeping or managing your Housekeeping? N     Patient Care Team: Jeani Sow, MD as PCP - General (Family Medicine)  Indicate any recent Medical Services you may have received from other than [facility] providers in the past year (date may be approximate).     Assessment:   This is a routine wellness examination for [place].  Hearing/Vision screen Hearing Screening - Comments:: Pt denies any hearing issues  Vision Screening - Comments:: Pt follows up with Dr Cathey Endow for annual eye exams    Goals Addressed             This Visit's Progress    Patient  Stated       Get back to the gym and lose weight        Depression Screen    04/14/2023    2:36 PM 02/21/2023    2:30 PM 02/11/2023   11:30 AM 12/11/2021    3:00 PM 12/11/2021    2:59 PM 07/21/2019    3:24 PM 12/03/2017    3:20 PM  PHQ 2/9 Scores  PHQ - 2 Score 0 0 0 0 0 0 0  PHQ- 9 Score 0 0  0       Fall Risk    04/10/2023    8:50 AM 02/21/2023    2:30 PM 04/09/2022    1:01 PM 12/11/2021    2:51 PM 07/21/2019    3:23 PM  Fall Risk   Falls in the past year? 0 0 0 0 1  Number falls in past yr:  0 0 0 0  Injury with Fall? 0 0 0 0 0  Comment     wrist bruising  Risk for fall due to : No Fall Risks No Fall Risks No Fall Risks No Fall Risks   Follow up Falls prevention discussed Falls evaluation completed Falls evaluation completed Falls prevention discussed Falls evaluation completed    MEDICARE RISK AT HOME: Medicare Risk at Home Any stairs in or around the home?: No If so, are there any without handrails?: No Home free of loose throw rugs in walkways, pet beds, electrical cords, etc?: No Adequate lighting in your home to reduce risk of falls?: Yes Life alert?: No Use of a cane, walker or w/c?: No Grab bars in the bathroom?: No Shower chair or bench in shower?: No Elevated toilet seat or a handicapped toilet?: No  TIMED UP AND GO:  Was the test performed? No    Cognitive Function:        04/14/2023    2:37 PM  6CIT Screen  What Year? 0 points  What month? 0 points  What time? 0 points  Count back from 20 0 points  Months in reverse 0 points  Repeat  phrase 0 points  Total Score 0 points    Immunizations Immunization History  Administered Date(s) Administered   PFIZER Comirnaty(Gray Top)Covid-19 Tri-Sucrose Vaccine 08/01/2019, 08/24/2019, 03/24/2020, 09/26/2020   Pfizer Covid-19 Vaccine Bivalent Booster 2yrs & up 05/18/2021   Pfizer(Comirnaty)Fall Seasonal Vaccine 12 years and older 02/11/2023    TDAP status: Due, Education has been provided regarding  the importance of this vaccine. Advised may receive this vaccine at local pharmacy or Health Dept. Aware to provide a copy of the vaccination record if obtained from local pharmacy or Health Dept. Verbalized acceptance and understanding.  Flu Vaccine status: Due, Education has been provided regarding the importance of this vaccine. Advised may receive this vaccine at local pharmacy or Health Dept. Aware to provide a copy of the vaccination record if obtained from local pharmacy or Health Dept. Verbalized acceptance and understanding.  Pneumococcal vaccine status: Due, Education has been provided regarding the importance of this vaccine. Advised may receive this vaccine at local pharmacy or Health Dept. Aware to provide a copy of the vaccination record if obtained from local pharmacy or Health Dept. Verbalized acceptance and understanding.  Covid-19 vaccine status: Completed vaccines  Qualifies for Shingles Vaccine? Yes   Zostavax completed No   Shingrix Completed?: No.    Education has been provided regarding the importance of this vaccine. Patient has been advised to call insurance company to determine out of pocket expense if they have not yet received this vaccine. Advised may also receive vaccine at local pharmacy or Health Dept. Verbalized acceptance and understanding.  Screening Tests Health Maintenance  Topic Date Due   DTaP/Tdap/Td (1 - Tdap) Never done   Pneumonia Vaccine 64+ Years old (1 of 1 - PCV) Never done   DEXA SCAN  Never done   Colonoscopy  12/15/2022   Zoster Vaccines- Shingrix (1 of 2) 05/12/2023 (Originally 06/12/1974)   INFLUENZA VACCINE  08/18/2023 (Originally 12/19/2022)   MAMMOGRAM  11/15/2023   Medicare Annual Wellness (AWV)  04/13/2024   COVID-19 Vaccine  Completed   Hepatitis C Screening  Completed   HPV VACCINES  Aged Out    Health Maintenance  Health Maintenance Due  Topic Date Due   DTaP/Tdap/Td (1 - Tdap) Never done   Pneumonia Vaccine 67+ Years old (1 of  1 - PCV) Never done   DEXA SCAN  Never done   Colonoscopy  12/15/2022    Colorectal cancer screening: Referral to GI placed 11/245/24. Pt aware the office will call re: appt.  Mammogram status: Completed 11/14/21. Repeat every year  Pt declined bone density 04/09/22   Additional Screening:  Hepatitis C Screening:  Completed 11/17/17  Vision Screening: Recommended annual ophthalmology exams for early detection of glaucoma and other disorders of the eye. Is the patient up to date with their annual eye exam?  Yes  Who is the provider or what is the name of the office in which the patient attends annual eye exams? Dr Cathey Endow  If pt is not established with a provider, would they like to be referred to a provider to establish care? No .   Dental Screening: Recommended annual dental exams for proper oral hygiene    Community Resource Referral / Chronic Care Management: CRR required this visit?  No   CCM required this visit?  No     Plan:     I have personally reviewed and noted the following in the patient's chart:   Medical and social history Use of alcohol, tobacco or illicit drugs  Current medications and supplements including opioid prescriptions. Patient is not currently taking opioid prescriptions. Functional ability and status Nutritional status Physical activity Advanced directives List of other physicians Hospitalizations, surgeries, and ER visits in previous 12 months Vitals Screenings to include cognitive, depression, and falls Referrals and appointments  In addition, I have reviewed and discussed with patient certain preventive protocols, quality metrics, and best practice recommendations. A written personalized care plan for preventive services as well as general preventive health recommendations were provided to patient.     Marzella Schlein, LPN   [id number]   After Visit Summary: (MyChart) Due to this being a telephonic visit, the after visit summary with  patients personalized plan was offered to patient via MyChart   Nurse Notes: none

## 2023-04-14 NOTE — Patient Instructions (Signed)
Ms. Santor , Thank you for taking time to come for your Medicare Wellness Visit. I appreciate your ongoing commitment to your health goals. Please review the following plan we discussed and let me know if I can assist you in the future.   Referrals/Orders/Follow-Ups/Clinician Recommendations: Aim for 30 minutes of exercise or brisk walking, 6-8 glasses of water, and 5 servings of fruits and vegetables each day. Continue to get back in the gym for weight loss    This is a list of the screening recommended for you and due dates:  Health Maintenance  Topic Date Due   DTaP/Tdap/Td vaccine (1 - Tdap) Never done   Pneumonia Vaccine (1 of 1 - PCV) Never done   DEXA scan (bone density measurement)  Never done   Colon Cancer Screening  12/15/2022   Zoster (Shingles) Vaccine (1 of 2) 05/12/2023*   Flu Shot  08/18/2023*   Mammogram  11/15/2023   Medicare Annual Wellness Visit  04/13/2024   COVID-19 Vaccine  Completed   Hepatitis C Screening  Completed   HPV Vaccine  Aged Out  *Topic was postponed. The date shown is not the original due date.    Advanced directives: (Declined) Advance directive discussed with you today. Even though you declined this today, please call our office should you change your mind, and we can give you the proper paperwork for you to fill out.  Next Medicare Annual Wellness Visit scheduled for next year: Yes

## 2023-06-05 ENCOUNTER — Ambulatory Visit (AMBULATORY_SURGERY_CENTER): Payer: Medicare HMO

## 2023-06-05 VITALS — Ht 68.0 in | Wt 195.0 lb

## 2023-06-05 DIAGNOSIS — Z1211 Encounter for screening for malignant neoplasm of colon: Secondary | ICD-10-CM

## 2023-06-05 MED ORDER — NA SULFATE-K SULFATE-MG SULF 17.5-3.13-1.6 GM/177ML PO SOLN
1.0000 | Freq: Once | ORAL | 0 refills | Status: AC
Start: 2023-06-05 — End: 2023-06-05

## 2023-06-05 NOTE — Progress Notes (Signed)
No egg or soy allergy known to patient  No issues known to pt with past sedation with any surgeries or procedures Patient denies ever being told they had issues or difficulty with intubation  No FH of Malignant Hyperthermia Pt is not on diet pills Pt is not on  home 02  Pt is not on blood thinners  Pt has mild constipation intermittently No A fib or A flutter Have any cardiac testing pending--No Pt can ambulate  Pt denies use of chewing tobacco Discussed diabetic I weight loss medication holds Discussed NSAID holds Checked BMI Pt instructed to use Singlecare.com or GoodRx for a price reduction on prep  Patient's chart reviewed by Cathlyn Parsons CNRA prior to previsit and patient appropriate for the [facility].  Pre visit completed and red dot placed by patient's name on their procedure day (on provider's schedule).

## 2023-06-16 ENCOUNTER — Ambulatory Visit (INDEPENDENT_AMBULATORY_CARE_PROVIDER_SITE_OTHER): Payer: Medicare HMO | Admitting: Family Medicine

## 2023-06-16 ENCOUNTER — Encounter: Payer: Self-pay | Admitting: Family Medicine

## 2023-06-16 VITALS — BP 112/72 | HR 60 | Temp 97.9°F | Resp 16 | Ht 68.0 in | Wt 204.5 lb

## 2023-06-16 DIAGNOSIS — Z0000 Encounter for general adult medical examination without abnormal findings: Secondary | ICD-10-CM

## 2023-06-16 DIAGNOSIS — R7303 Prediabetes: Secondary | ICD-10-CM | POA: Diagnosis not present

## 2023-06-16 DIAGNOSIS — C541 Malignant neoplasm of endometrium: Secondary | ICD-10-CM | POA: Diagnosis not present

## 2023-06-16 DIAGNOSIS — Z23 Encounter for immunization: Secondary | ICD-10-CM

## 2023-06-16 DIAGNOSIS — E2839 Other primary ovarian failure: Secondary | ICD-10-CM

## 2023-06-16 LAB — POCT GLYCOSYLATED HEMOGLOBIN (HGB A1C): Hemoglobin A1C: 5.6 % (ref 4.0–5.6)

## 2023-06-16 NOTE — Progress Notes (Signed)
Phone [phone]   Subjective:   Patient is a 68 y.o. female presenting for annual physical.    Chief Complaint  Patient presents with   Annual Exam    CPE Fasting    Cpe-colon sch for Feb.  Mamm due. Dxa never.   See problem oriented charting- ROS- ROS: Gen: no fever, chills  Skin: no rash, itching ENT: no ear pain, ear drainage, nasal congestion, rhinorrhea, sinus pressure, sore throat Eyes: no blurry vision, double vision Resp: no cough, wheeze,SOB CV: no CP, palpitations, LE edema,   wearing compression stockings and electrolytes so dizziness gone GI: no heartburn, n/v/d/c, abd pain GU: no dysuria, urgency, frequency, hematuria MSK: no joint pain, myalgias, back pain.  Hip pain L-pain on long walks.   Neuro: no dizziness, headache, weakness, vertigo Psych: no depression, anxiety, insomnia, SI   The following were reviewed and entered/updated in epic: Past Medical History:  Diagnosis Date   Arthritis    Atrial fibrillation (HCC) 05/20/1998   from stress/increased caffeine use, resolved   Cancer (HCC)    Cataract    Dysrhythmia    GERD (gastroesophageal reflux disease)    Hearing loss 05/20/2010   left ear   History of chicken pox    History of endometrial cancer    Hyperlipidemia    Hyperplastic colon polyp    Macroprolactinemia    Orthostatic hypotension    Palpitation    Pituitary adenoma (HCC)    micro   Pneumonia    Patient Active Problem List   Diagnosis Date Noted   Genetic testing 03/27/2023   Endometrial cancer (HCC) 02/26/2023   Barrett's esophagus 08/23/2022   Fibrocystic disease of left breast in female 11/17/2017   Family history of colon cancer in mother 11/17/2017   Family history of breast cancer in mother 11/17/2017   Vitamin D insufficiency 12/26/2016   Low serum vitamin B12 12/26/2016   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   Multinodular goiter 02/26/2013   Past Surgical History:  Procedure Laterality Date   ABDOMINAL  HYSTERECTOMY     CATARACT EXTRACTION     ROBOTIC ASSISTED TOTAL HYSTERECTOMY WITH BILATERAL SALPINGO OOPHERECTOMY Bilateral 02/26/2023   Procedure: XI ROBOTIC ASSISTED TOTAL HYSTERECTOMY WITH BILATERAL SALPINGO OOPHORECTOMY;  Surgeon: Carver Fila, MD;  Location: [facility];  Service: Gynecology;  Laterality: Bilateral;   SENTINEL NODE BIOPSY N/A 02/26/2023   Procedure: SENTINEL NODE BIOPSY;  Surgeon: Carver Fila, MD;  Location: [facility];  Service: Gynecology;  Laterality: N/A;   TONSILLECTOMY AND ADENOIDECTOMY  1965    Family History  Problem Relation Age of Onset   Colon cancer Mother 39   Stroke Mother 45       10/18/2017   Hyperlipidemia Mother    Breast cancer Mother 85 - 49   Cancer Mother    Early death Father    Arthritis Sister    Diabetes Brother        Type 2 Diabetes   Thyroid cancer Maternal Aunt    Esophageal cancer Maternal Uncle    Diabetes Maternal Grandmother    Hearing loss Maternal Grandmother    Uterine cancer Maternal Grandmother 86 - 79   Stomach cancer Neg Hx    Rectal cancer Neg Hx     Medications- reviewed and updated Current Outpatient Medications  Medication Sig Dispense Refill   Cholecalciferol 25 MCG (1000 UT) capsule Take 1,000 Units by mouth daily.     Cyanocobalamin (B-12) 1000 MCG CAPS Take 1,000 Units by mouth daily.  estradiol (ESTRACE VAGINAL) 0.1 MG/GM vaginal cream Use this 3 times a week at night.  Please do not use the applicator initially given recent surgery.  Instead, place a pea size amount of the vaginal estrogen on your finger and insert this into the bottom part of the vagina before you go to sleep. 42.5 g 3   Magnesium 400 MG CAPS Take 400 mg by mouth daily.     pantoprazole (PROTONIX) 40 MG tablet Increase Pantoprazole to 40 mg twice daily before meals 60 tablet 3   Current Facility-Administered Medications  Medication Dose Route Frequency Provider Last Rate Last Admin   0.9 %  sodium chloride infusion  500 mL  Intravenous Once [name], [name], MD        Allergies-reviewed and updated Allergies  Allergen Reactions   Erythromycin Shortness Of Breath and Anaphylaxis   Procaine Palpitations    Chest Pain    Quinolones Other (See Comments)    Insomnia Stay awake all night   Neosporin [Bacitracin-Polymyxin B] Dermatitis    Social History   Social History Narrative   Regular exercise-no Caffeine Use-yes      6 grands   retired   Objective  Objective:  BP 112/72   Pulse 60   Temp 97.9 F (36.6 C) (Temporal)   Resp 16   Ht 5\' 8"  (1.727 m)   Wt 204 lb 8 oz (92.8 kg)   SpO2 96%   BMI 31.09 kg/m  Physical Exam  Gen: WDWN NAD HEENT: NCAT, conjunctiva not injected, sclera nonicteric TM WNL B, OP moist, no exudates  NECK:  supple, no thyromegaly, no nodes, no carotid bruits CARDIAC: RRR, S1S2+, no murmur. DP 2+B LUNGS: CTAB. No wheezes ABDOMEN:  BS+, soft, NTND, No HSM, no masses EXT:  no edema MSK: no gross abnormalities. MS 5/5 all 4 NEURO: A&O x3.  CN II-XII intact.  PSYCH: normal mood. Good eye contact  Results for orders placed or performed in visit on 06/16/23  POCT HgB A1C   Collection Time: 06/16/23 10:24 AM  Result Value Ref Range   Hemoglobin A1C 5.6 4.0 - 5.6 %      Assessment and Plan   Health Maintenance counseling: 1. Anticipatory guidance: Patient counseled regarding regular dental exams q6 months, eye exams,  avoiding smoking and second hand smoke, limiting alcohol to 1 beverage per day, no illicit drugs.   2. Risk factor reduction:  Advised patient of need for regular exercise and diet rich and fruits and vegetables to reduce risk of heart attack and stroke. Exercise- encouraged.  Wt Readings from Last 3 Encounters:  06/16/23 204 lb 8 oz (92.8 kg)  06/05/23 195 lb (88.5 kg)  04/14/23 200 lb (90.7 kg)   3. Immunizations/screenings/ancillary studies Immunization History  Administered Date(s) Administered   PFIZER Comirnaty(Gray Top)Covid-19 Tri-Sucrose  Vaccine 08/01/2019, 08/24/2019, 03/24/2020, 09/26/2020   Pfizer Covid-19 Vaccine Bivalent Booster 35yrs & up 05/18/2021   Pfizer(Comirnaty)Fall Seasonal Vaccine 12 years and older 02/11/2023   Health Maintenance Due  Topic Date Due   DEXA SCAN  Never done   Colonoscopy  12/15/2022    4. Cervical cancer screening- utd 5. Breast cancer screening-  mammogram utd 6. Colon cancer screening - sch 7. Skin cancer screening- advised regular sunscreen use. Denies worrisome, changing, or new skin lesions.  8. Birth control/STD check- n/a 9. Osteoporosis screening- ordered 10. Smoking associated screening - former smoker  Wellness examination  Endometrial cancer (HCC)  Low serum vitamin B12  Vitamin D insufficiency  Estrogen deficiency -     DG Bone Density; Future  Need for pneumococcal vaccination -     Pneumococcal conjugate vaccine 20-valent   Annual-antic guidance.  Pneumovax 20 given PreDM-working on diet and encouraged exercise.  Check A1C Endometrial cancer-s/p hyst.  Has seen geneticist.  Seeing gyn/onc.  Recommended follow up: Return in about 1 year (around 06/15/2024) for annual physical.  Lab/Order associations:+ fasting  Angelena Sole, MD

## 2023-06-16 NOTE — Patient Instructions (Signed)
It was very nice to see you today!  [facility] at [facility]  [address] on the 1st floor Phone number [phone]    PLEASE NOTE:  If you had any lab tests please let us know if you have not heard back within a few days. You may see your results on MyChart before we have a chance to review them but we will give you a call once they are reviewed by [place]. If we ordered any referrals today, please let us know if you have not heard from their office within the next week.   Please try these tips to maintain a healthy lifestyle:  Eat most of your calories during the day when you are active. Eliminate processed foods including packaged sweets (pies, cakes, cookies), reduce intake of potatoes, white bread, white pasta, and white rice. Look for whole grain options, oat flour or almond flour.  Each meal should contain half fruits/vegetables, one quarter protein, and one quarter carbs (no bigger than a computer mouse).  Cut down on sweet beverages. This includes juice, soda, and sweet tea. Also watch fruit intake, though this is a healthier sweet option, it still contains natural sugar! Limit to 3 servings daily.  Drink at least 1 glass of water with each meal and aim for at least 8 glasses per day  Exercise at least 150 minutes every week.

## 2023-06-27 DIAGNOSIS — R92323 Mammographic fibroglandular density, bilateral breasts: Secondary | ICD-10-CM | POA: Diagnosis not present

## 2023-06-27 DIAGNOSIS — Z1231 Encounter for screening mammogram for malignant neoplasm of breast: Secondary | ICD-10-CM | POA: Diagnosis not present

## 2023-07-01 DIAGNOSIS — M545 Low back pain, unspecified: Secondary | ICD-10-CM | POA: Diagnosis not present

## 2023-07-02 ENCOUNTER — Ambulatory Visit (HOSPITAL_BASED_OUTPATIENT_CLINIC_OR_DEPARTMENT_OTHER)
Admission: RE | Admit: 2023-07-02 | Discharge: 2023-07-02 | Disposition: A | Discharge status: Home / Self Care | Payer: Medicare HMO | Source: Ambulatory Visit | Attending: Family Medicine | Admitting: Family Medicine

## 2023-07-02 DIAGNOSIS — E2839 Other primary ovarian failure: Secondary | ICD-10-CM | POA: Diagnosis not present

## 2023-07-02 DIAGNOSIS — M810 Age-related osteoporosis without current pathological fracture: Secondary | ICD-10-CM | POA: Diagnosis not present

## 2023-07-02 LAB — HM DEXA SCAN

## 2023-07-03 ENCOUNTER — Encounter: Payer: Self-pay | Admitting: Family Medicine

## 2023-07-07 ENCOUNTER — Telehealth: Payer: Self-pay | Admitting: Internal Medicine

## 2023-07-07 NOTE — Telephone Encounter (Signed)
Patient called stated she is scheduled for 07/11/23 and still has not received the pre medication.

## 2023-07-07 NOTE — Telephone Encounter (Signed)
Prep was sent to [facility] on [address], pt to check there and call if there is a problem

## 2023-07-08 ENCOUNTER — Ambulatory Visit (HOSPITAL_BASED_OUTPATIENT_CLINIC_OR_DEPARTMENT_OTHER): Payer: Medicare HMO

## 2023-07-08 ENCOUNTER — Encounter (HOSPITAL_BASED_OUTPATIENT_CLINIC_OR_DEPARTMENT_OTHER): Payer: Self-pay | Admitting: Student

## 2023-07-08 ENCOUNTER — Encounter: Payer: Self-pay | Admitting: Family Medicine

## 2023-07-08 ENCOUNTER — Ambulatory Visit: Payer: Medicare HMO | Admitting: Family Medicine

## 2023-07-08 ENCOUNTER — Other Ambulatory Visit: Payer: Self-pay | Admitting: *Deleted

## 2023-07-08 ENCOUNTER — Ambulatory Visit (HOSPITAL_BASED_OUTPATIENT_CLINIC_OR_DEPARTMENT_OTHER): Payer: Medicare HMO | Admitting: Student

## 2023-07-08 VITALS — BP 110/70 | HR 77 | Temp 97.5°F | Resp 18 | Ht 68.0 in | Wt 205.0 lb

## 2023-07-08 DIAGNOSIS — E559 Vitamin D deficiency, unspecified: Secondary | ICD-10-CM

## 2023-07-08 DIAGNOSIS — M25512 Pain in left shoulder: Secondary | ICD-10-CM

## 2023-07-08 DIAGNOSIS — M810 Age-related osteoporosis without current pathological fracture: Secondary | ICD-10-CM

## 2023-07-08 DIAGNOSIS — S42255A Nondisplaced fracture of greater tuberosity of left humerus, initial encounter for closed fracture: Secondary | ICD-10-CM

## 2023-07-08 LAB — VITAMIN D 25 HYDROXY (VIT D DEFICIENCY, FRACTURES): VITD: 32.32 ng/mL (ref 30.00–100.00)

## 2023-07-08 MED ORDER — DENOSUMAB 60 MG/ML ~~LOC~~ SOSY
60.0000 mg | PREFILLED_SYRINGE | Freq: Once | SUBCUTANEOUS | Status: AC
Start: 2023-07-22 — End: ?

## 2023-07-08 NOTE — Patient Instructions (Signed)
Go to ortho clinic at [facility]

## 2023-07-08 NOTE — Progress Notes (Signed)
Chief Complaint: Left shoulder injury     History of Present Illness:   Crystal Bauer "Almyra Free" is a 68 year old female who presents with left shoulder pain following a fall.  She experienced left shoulder pain after slipping on her balcony. In an attempt to regain her balance, she grabbed a chair which slid away from her, resulting in her left arm being pulled and causing to her fall. The pain is primarily located in the anterior shoulder and radiates down her arm, particularly with certain movements. Since the fall, she has been managing her pain with Tylenol and ibuprofen, which she found effective. However, due to an upcoming colonoscopy, she has stopped taking ibuprofen and is currently only using Tylenol, which she finds less effective. The pain is exacerbated by movement, and she notes a tingling sensation in her left pointer and index fingers, which she noticed today.  She denies any previous injuries to this shoulder and reports no significant pain in other areas. She is right-handed and mentions that her shoulder pain limits her ability to raise her arm and perform certain activities, such as closing a car door with her left arm. No numbness or tingling in her hand until today.      Surgical History:   None  PMH/PSH/Family History/Social History/Meds/Allergies:    Past Medical History:  Diagnosis Date   Arthritis    Atrial fibrillation (HCC) 05/20/1998   from stress/increased caffeine use, resolved   Cancer (HCC)    Cataract    Dysrhythmia    GERD (gastroesophageal reflux disease)    Hearing loss 05/20/2010   left ear   History of chicken pox    History of endometrial cancer    Hyperlipidemia    Hyperplastic colon polyp    Macroprolactinemia    Orthostatic hypotension    Palpitation    Pituitary adenoma (HCC)    micro   Pneumonia    Past Surgical History:  Procedure Laterality Date   ABDOMINAL HYSTERECTOMY     CATARACT  EXTRACTION     ROBOTIC ASSISTED TOTAL HYSTERECTOMY WITH BILATERAL SALPINGO OOPHERECTOMY Bilateral 02/26/2023   Procedure: XI ROBOTIC ASSISTED TOTAL HYSTERECTOMY WITH BILATERAL SALPINGO OOPHORECTOMY;  Surgeon: Carver Fila, MD;  Location: [facility];  Service: Gynecology;  Laterality: Bilateral;   SENTINEL NODE BIOPSY N/A 02/26/2023   Procedure: SENTINEL NODE BIOPSY;  Surgeon: Carver Fila, MD;  Location: [facility];  Service: Gynecology;  Laterality: N/A;   TONSILLECTOMY AND ADENOIDECTOMY  1965   Social History   Socioeconomic History   Marital status: Divorced    Spouse name: Not on file   Number of children: 3   Years of education: 12+   Highest education level: Bachelor's degree (e.g., BA, AB, BS)  Occupational History   Occupation: Teacher, 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  Tobacco Use   Smoking status: Former    Types: E-cigarettes   Smokeless tobacco: Never  Vaping Use   Vaping status: Never Used  Substance and Sexual Activity   Alcohol use: No   Drug use: No   Sexual activity: Not Currently    Birth control/protection: Post-menopausal  Other Topics Concern   Not on file  Social History Narrative   Regular exercise-no Caffeine Use-yes      6 grands  retired   Teacher, early years/pre Strain: Low Risk  (07/04/2023)   Overall Financial Resource Strain (CARDIA)    Difficulty of Paying Living Expenses: Not hard at all  Food Insecurity: No Food Insecurity (07/04/2023)   Hunger Vital Sign    Worried About Running Out of Food in the Last Year: Never true    Ran Out of Food in the Last Year: Never true  Transportation Needs: No Transportation Needs (07/04/2023)   PRAPARE - Administrator, Civil Service (Medical): No    Lack of Transportation (Non-Medical): No  Physical Activity: Insufficiently Active (07/04/2023)   Exercise Vital Sign    Days of Exercise per Week: 3 days    Minutes of Exercise per  Session: 20 min  Stress: No Stress Concern Present (07/04/2023)   [name] of Occupational Health - Occupational Stress Questionnaire    Feeling of Stress : Not at all  Social Connections: Socially Isolated (07/04/2023)   Social Connection and Isolation Panel [NHANES]    Frequency of Communication with Friends and Family: Once a week    Frequency of Social Gatherings with Friends and Family: More than three times a week    Attends Religious Services: Never    Database administrator or Organizations: No    Attends Engineer, structural: Not on file    Marital Status: Divorced   Family History  Problem Relation Age of Onset   Colon cancer Mother 54   Stroke Mother 69       10/18/2017   Hyperlipidemia Mother    Breast cancer Mother 67 - 27   Cancer Mother    Early death Father    Arthritis Sister    Diabetes Brother        Type 2 Diabetes   Thyroid cancer Maternal Aunt    Esophageal cancer Maternal Uncle    Diabetes Maternal Grandmother    Hearing loss Maternal Grandmother    Uterine cancer Maternal Grandmother 31 - 79   Stomach cancer Neg Hx    Rectal cancer Neg Hx    Allergies  Allergen Reactions   Erythromycin Shortness Of Breath and Anaphylaxis   Procaine Palpitations    Chest Pain    Quinolones Other (See Comments)    Insomnia Stay awake all night   Neosporin [Bacitracin-Polymyxin B] Dermatitis   Current Outpatient Medications  Medication Sig Dispense Refill   Cholecalciferol 25 MCG (1000 UT) capsule Take 1,000 Units by mouth daily.     Cyanocobalamin (B-12) 1000 MCG CAPS Take 1,000 Units by mouth daily.     estradiol (ESTRACE VAGINAL) 0.1 MG/GM vaginal cream Use this 3 times a week at night.  Please do not use the applicator initially given recent surgery.  Instead, place a pea size amount of the vaginal estrogen on your finger and insert this into the bottom part of the vagina before you go to sleep. 42.5 g 3   Magnesium 400 MG CAPS Take 400 mg by  mouth daily.     Na Sulfate-K Sulfate-Mg Sulfate concentrate 17.5-3.13-1.6 GM/177ML SOLN      pantoprazole (PROTONIX) 40 MG tablet Increase Pantoprazole to 40 mg twice daily before meals 60 tablet 3   Current Facility-Administered Medications  Medication Dose Route Frequency Provider Last Rate Last Admin   [START ON 07/22/2023] denosumab (PROLIA) injection 60 mg  60 mg Subcutaneous Once Jeani Sow, MD       No results found.  Review of  Systems:   A ROS was performed including pertinent positives and negatives as documented in the HPI.  Physical Exam :   Constitutional: NAD and appears stated age Neurological: Alert and oriented Psych: Appropriate affect and cooperative There were no vitals taken for this visit.   Comprehensive Musculoskeletal Exam:    Tenderness to palpation over the left anterior glenohumeral joint and lateral deltoid.  Focal ecchymosis noted over the anterior proximal third of the upper arm.  Active shoulder range of motion to 40 degrees forward flexion, 70 degrees external rotation, and internal rotation to L4.  Radial pulse 2+.  Motor exam of the hand intact.  Imaging:   Xray (left shoulder 3 views): Fracture involving the greater tuberosity with minimal displacement.  Calcification noted superior to the greater tuberosity which may represent chronic calcific tendinitis versus acute injury.   I personally reviewed and interpreted the radiographs.   Assessment and Plan:   Left Shoulder Injury   Acute pain and limited range of motion are present following a fall. X-ray indicates a small nondisplaced fracture of the greater tuberosity and possible chronic calcifications. A rotator cuff injury is suspected based on the mechanism of injury, significant ROM deficits, and likely avulsion of the greater tuberosity. Order a stat MRI to evaluate the fracture and assess the rotator cuff. Provide a sling for immobilization and comfort. Continue Tylenol for pain  management, with the possibility of additional medication after the colonoscopy.  Follow-up   Plan to review MRI results and discuss further treatment options, including potential surgical interventions if a significant rotator cuff injury is seen. Schedule a follow-up appointment as soon as possible after the MRI.       I personally saw and evaluated the patient, and participated in the management and treatment plan.  Hazle Nordmann, PA-C Orthopedics

## 2023-07-08 NOTE — Progress Notes (Signed)
Subjective:     Patient ID: Crystal Bauer, female    DOB: [birth year], 68 y.o.   MRN: [id number]  Chief Complaint  Patient presents with   Discuss Treatment    Discuss osteoporosis and treatment options    Shoulder Injury    Slipped and fell on Saturday evening, hurt left shoulder, limited range of motion, taking Tylenol, last dose about 1 hour ago    HPI Discussed the use of AI scribe software for clinical note transcription with the patient, who gave verbal consent to proceed.  History of Present Illness   Crystal Bauer "Crystal Bauer" is a 68 year old female with osteoporosis who presents with left arm pain after a fall.  She experiences left arm pain following a fall on Saturday, July 05, 2023,. She slipped and grabbed a chair, which also slipped, leading to the injury. She describes immediate pain and difficulty lifting her arm, with a large bruise and initial swelling that has since decreased. She was taking ibuprofen for pain relief but discontinued it due to an upcoming colonoscopy, resulting in increased pain. She notes a 'crunch' sensation when moving the arm and numbness in the index and middle fingers that started today.  She has a new dx of osteoporosis, with a recent bone density scan showing a T-score of -2.7 in the spine, indicating osteoporosis, and T-scores of -1.9 and -1.2 in the left femoral neck and total femur, respectively, indicating osteopenia. She has not been exercising as much as she should and recently started physical therapy to manage hip pain. She has a hiatal hernia and is taking Protonix, which limits her ability to take oral bisphosphonates for osteoporosis management.  She experiences hip pain that limits her ability to walk long distances. She has been attending physical therapy at a gym to help manage this pain. She self-referred to physical therapy after joining the gym and experiencing significant discomfort while using the equipment.  No pain  when turning her head, but she reports tightness. No radiating pain down the arm. She notes that her veins appear more prominent than usual on the affected arm.       Health Maintenance Due  Topic Date Due   Colonoscopy  12/15/2022    Past Medical History:  Diagnosis Date   Arthritis    Atrial fibrillation (HCC) 05/20/1998   from stress/increased caffeine use, resolved   Cancer (HCC)    Cataract    Dysrhythmia    GERD (gastroesophageal reflux disease)    Hearing loss 05/20/2010   left ear   History of chicken pox    History of endometrial cancer    Hyperlipidemia    Hyperplastic colon polyp    Macroprolactinemia    Orthostatic hypotension    Palpitation    Pituitary adenoma (HCC)    micro   Pneumonia     Past Surgical History:  Procedure Laterality Date   ABDOMINAL HYSTERECTOMY     CATARACT EXTRACTION     ROBOTIC ASSISTED TOTAL HYSTERECTOMY WITH BILATERAL SALPINGO OOPHERECTOMY Bilateral 02/26/2023   Procedure: XI ROBOTIC ASSISTED TOTAL HYSTERECTOMY WITH BILATERAL SALPINGO OOPHORECTOMY;  Surgeon: Carver Fila, MD;  Location: [facility];  Service: Gynecology;  Laterality: Bilateral;   SENTINEL NODE BIOPSY N/A 02/26/2023   Procedure: SENTINEL NODE BIOPSY;  Surgeon: Carver Fila, MD;  Location: [facility];  Service: Gynecology;  Laterality: N/A;   TONSILLECTOMY AND ADENOIDECTOMY  1965     Current Outpatient Medications:    Cholecalciferol 25 MCG (1000  UT) capsule, Take 1,000 Units by mouth daily., Disp: , Rfl:    Cyanocobalamin (B-12) 1000 MCG CAPS, Take 1,000 Units by mouth daily., Disp: , Rfl:    estradiol (ESTRACE VAGINAL) 0.1 MG/GM vaginal cream, Use this 3 times a week at night.  Please do not use the applicator initially given recent surgery.  Instead, place a pea size amount of the vaginal estrogen on your finger and insert this into the bottom part of the vagina before you go to sleep., Disp: 42.5 g, Rfl: 3   Magnesium 400 MG CAPS, Take 400 mg by mouth daily.,  Disp: , Rfl:    Na Sulfate-K Sulfate-Mg Sulfate concentrate 17.5-3.13-1.6 GM/177ML SOLN, , Disp: , Rfl:    pantoprazole (PROTONIX) 40 MG tablet, Increase Pantoprazole to 40 mg twice daily before meals, Disp: 60 tablet, Rfl: 3  Allergies  Allergen Reactions   Erythromycin Shortness Of Breath and Anaphylaxis   Procaine Palpitations    Chest Pain    Quinolones Other (See Comments)    Insomnia Stay awake all night   Neosporin [Bacitracin-Polymyxin B] Dermatitis   ROS neg/noncontributory except as noted HPI/below      Objective:     BP 110/70   Pulse 77   Temp (!) 97.5 F (36.4 C) (Temporal)   Resp 18   Ht 5\' 8"  (1.727 m)   SpO2 96%   BMI 31.09 kg/m  Wt Readings from Last 3 Encounters:  06/16/23 204 lb 8 oz (92.8 kg)  06/05/23 195 lb (88.5 kg)  04/14/23 200 lb (90.7 kg)    Physical Exam   Gen: WDWN NAD HEENT: NCAT, conjunctiva not injected, sclera nonicteric EXT:  no edema MSK: no gross abnormalities.  NEURO: A&O x3.  CN II-XII intact.  PSYCH: normal mood. Good eye contact  Bruise L biceps area.  Did not put pt L shoulder through exam as sending to ortho clinic     Assessment & Plan:  Age-related osteoporosis without current pathological fracture -     VITAMIN D 25 Hydroxy (Vit-D Deficiency, Fractures)  Vitamin D insufficiency -     VITAMIN D 25 Hydroxy (Vit-D Deficiency, Fractures)  Acute pain of left shoulder  Assessment and Plan    Left Shoulder Injury An acute left shoulder injury occurred on July 05, 2023, from a fall. There is significant pain, bruising, swelling, and difficulty lifting the arm, suggesting a possible rotator cuff tear or fracture. Numbness in the index and middle fingers indicates potential nerve involvement. Refer to the acute care ortho clinic at [facility] for evaluation and imaging. Avoid ibuprofen due to an upcoming colonoscopy.  Osteoporosis Osteoporosis is present with a T-score of -2.7 in the spine and osteopenia in  the left femoral neck (-1.9) and total femur (-1.2). Oral bisphosphonates are unsuitable due to a hiatal hernia. Discussed treatment options include Reclast (IV infusion once a year), Evenity (monthly injections for one year followed by Prolia every six months), and Prolia alone. Evenity builds bone, while Prolia maintains bone density. Currently undergoing physical therapy for hip pain. Order a vitamin D blood test and recommend 2000 IU of vitamin D daily. Discuss dietary calcium intake, aiming for 1200 mg per day from food sources. Initiate prior authorization for [employer].  Hiatal Hernia A hiatal hernia is managed with Protonix, which contraindicates oral bisphosphonates for osteoporosis treatment. Continue Protonix as prescribed.  General Health Maintenance Maintain adequate calcium and vitamin D intake to support bone health. Ensure dietary intake of 1200 mg of calcium  per day and take 2000 IU of vitamin D daily.  Follow-up Follow up with the ortho clinic at [facility] for shoulder evaluation. Follow up on vitamin D blood test results. Monitor prior authorization for Evenity and follow up if no response within two weeks.        Return if symptoms worsen or fail to improve.  Angelena Sole, MD

## 2023-07-08 NOTE — Progress Notes (Signed)
Vitamin D ok.  I saw note from ortho-fracture shoulder!

## 2023-07-09 ENCOUNTER — Encounter (HOSPITAL_BASED_OUTPATIENT_CLINIC_OR_DEPARTMENT_OTHER): Payer: Self-pay | Admitting: Student

## 2023-07-09 ENCOUNTER — Telehealth: Payer: Self-pay | Admitting: Internal Medicine

## 2023-07-09 ENCOUNTER — Encounter: Payer: Self-pay | Admitting: Internal Medicine

## 2023-07-09 ENCOUNTER — Telehealth: Payer: Self-pay

## 2023-07-09 NOTE — Telephone Encounter (Signed)
 Prolia VOB initiated via [email or web address]

## 2023-07-09 NOTE — Telephone Encounter (Signed)
Spoke with patient. She is currently waiting to be scheduled MRI to determine if she needs surgery. Explained to patient if she has to have surgery she would need to wait at least 6 weeks post op before we can  proceed. Colonoscopy for 2/21 has been cancelled. Pt will call to r/s once she is aware if she will need surgery and/or healing time recommendations per her MD.

## 2023-07-09 NOTE — Telephone Encounter (Signed)
Inbound call from patient states over the weekend she had a fall and now has a non displaced fracture on her left rotator cuff and she is unsure if she should continue with procedure, she states it is painful to lay on her left side. She would like to discuss procedure with a nurse.

## 2023-07-10 ENCOUNTER — Encounter (HOSPITAL_BASED_OUTPATIENT_CLINIC_OR_DEPARTMENT_OTHER): Payer: Self-pay

## 2023-07-11 ENCOUNTER — Encounter: Payer: Medicare PPO | Admitting: Internal Medicine

## 2023-07-11 ENCOUNTER — Other Ambulatory Visit: Payer: Medicare HMO

## 2023-07-14 ENCOUNTER — Other Ambulatory Visit (HOSPITAL_COMMUNITY): Payer: Self-pay

## 2023-07-14 NOTE — Telephone Encounter (Signed)
 Crystal Bauer

## 2023-07-14 NOTE — Telephone Encounter (Signed)
 Pharmacy Patient Advocate Encounter   Received notification from  [employer] Portal that prior authorization for PROLIA is required/requested.   Insurance verification completed.   The patient is insured through [employer] .   Per test claim: PA required; PA submitted to above mentioned insurance via CoverMyMeds Key/confirmation #/EOC [id number] Status is pending

## 2023-07-15 ENCOUNTER — Other Ambulatory Visit (HOSPITAL_COMMUNITY): Payer: Self-pay

## 2023-07-15 ENCOUNTER — Other Ambulatory Visit: Payer: Self-pay | Admitting: Internal Medicine

## 2023-07-15 NOTE — Telephone Encounter (Signed)
 Pt ready for scheduling for PROLIA on or after : 07/15/23  Option# 1: Buy/Bill (Office supplied medication)  Out-of-pocket cost due at time of clinic visit: $357  Number of injection/visits approved: 2  Primary: [employer] Prolia co-insurance: 20% Admin fee co-insurance: 20%  Secondary: --- Prolia co-insurance:  Admin fee co-insurance:   Medical Benefit Details: Date Benefits were checked: 07/09/23 Deductible: NO/ Coinsurance: 20%/ Admin Fee: 20%  Prior Auth: APPROVED PA# [id number] Expiration Date: 07/15/23-05/19/24  # of doses approved:2 ----------------------------------------------------------------------- Option# 2- Med Obtained from pharmacy:  Pharmacy benefit: Copay $975.16 (Paid to pharmacy) Admin Fee: 20% (Pay at clinic)  Prior Auth: N/A PA# Expiration Date:   # of doses approved:   If patient wants fill through the pharmacy benefit please send prescription to: [employer], and include estimated need by date in rx notes. Pharmacy will ship medication directly to the office.  Patient NOT eligible for Prolia Copay Card. Copay Card can make patient's cost as little as $25. Link to apply: [email or web address]://[email or web address]  ** This summary of benefits is an estimation of the patient's out-of-pocket cost. Exact cost may very based on individual plan coverage.

## 2023-07-15 NOTE — Telephone Encounter (Signed)
 Pharmacy Patient Advocate Encounter  Received notification from [facility] that Prior Authorization for PROLIA has been APPROVED from 07/15/23 to 05/19/24. Ran test claim, Copay is $975.16. This test claim was processed through [facility]- copay amounts may vary at other pharmacies due to pharmacy/plan contracts, or as the patient moves through the different stages of their insurance plan.   PA #/Case ID/Reference #:  [id number]

## 2023-07-16 DIAGNOSIS — M9901 Segmental and somatic dysfunction of cervical region: Secondary | ICD-10-CM | POA: Diagnosis not present

## 2023-07-16 DIAGNOSIS — M9904 Segmental and somatic dysfunction of sacral region: Secondary | ICD-10-CM | POA: Diagnosis not present

## 2023-07-16 DIAGNOSIS — M9902 Segmental and somatic dysfunction of thoracic region: Secondary | ICD-10-CM | POA: Diagnosis not present

## 2023-07-16 DIAGNOSIS — M545 Low back pain, unspecified: Secondary | ICD-10-CM | POA: Diagnosis not present

## 2023-07-16 DIAGNOSIS — M25552 Pain in left hip: Secondary | ICD-10-CM | POA: Diagnosis not present

## 2023-07-22 DIAGNOSIS — M9901 Segmental and somatic dysfunction of cervical region: Secondary | ICD-10-CM | POA: Diagnosis not present

## 2023-07-22 DIAGNOSIS — M25552 Pain in left hip: Secondary | ICD-10-CM | POA: Diagnosis not present

## 2023-07-22 DIAGNOSIS — M9902 Segmental and somatic dysfunction of thoracic region: Secondary | ICD-10-CM | POA: Diagnosis not present

## 2023-07-22 DIAGNOSIS — M9904 Segmental and somatic dysfunction of sacral region: Secondary | ICD-10-CM | POA: Diagnosis not present

## 2023-07-23 DIAGNOSIS — M545 Low back pain, unspecified: Secondary | ICD-10-CM | POA: Diagnosis not present

## 2023-07-24 DIAGNOSIS — M9904 Segmental and somatic dysfunction of sacral region: Secondary | ICD-10-CM | POA: Diagnosis not present

## 2023-07-24 DIAGNOSIS — M9901 Segmental and somatic dysfunction of cervical region: Secondary | ICD-10-CM | POA: Diagnosis not present

## 2023-07-24 DIAGNOSIS — M25552 Pain in left hip: Secondary | ICD-10-CM | POA: Diagnosis not present

## 2023-07-24 DIAGNOSIS — M9902 Segmental and somatic dysfunction of thoracic region: Secondary | ICD-10-CM | POA: Diagnosis not present

## 2023-07-26 ENCOUNTER — Ambulatory Visit
Admission: RE | Admit: 2023-07-26 | Discharge: 2023-07-26 | Disposition: A | Discharge status: Home / Self Care | Payer: Medicare HMO | Source: Ambulatory Visit | Attending: Student | Admitting: Student

## 2023-07-26 DIAGNOSIS — M25512 Pain in left shoulder: Secondary | ICD-10-CM

## 2023-07-26 DIAGNOSIS — S42255A Nondisplaced fracture of greater tuberosity of left humerus, initial encounter for closed fracture: Secondary | ICD-10-CM | POA: Diagnosis not present

## 2023-07-29 DIAGNOSIS — M9904 Segmental and somatic dysfunction of sacral region: Secondary | ICD-10-CM | POA: Diagnosis not present

## 2023-07-29 DIAGNOSIS — M9902 Segmental and somatic dysfunction of thoracic region: Secondary | ICD-10-CM | POA: Diagnosis not present

## 2023-07-29 DIAGNOSIS — M9901 Segmental and somatic dysfunction of cervical region: Secondary | ICD-10-CM | POA: Diagnosis not present

## 2023-07-29 DIAGNOSIS — M25552 Pain in left hip: Secondary | ICD-10-CM | POA: Diagnosis not present

## 2023-07-30 DIAGNOSIS — M545 Low back pain, unspecified: Secondary | ICD-10-CM | POA: Diagnosis not present

## 2023-07-31 DIAGNOSIS — M9902 Segmental and somatic dysfunction of thoracic region: Secondary | ICD-10-CM | POA: Diagnosis not present

## 2023-07-31 DIAGNOSIS — M9901 Segmental and somatic dysfunction of cervical region: Secondary | ICD-10-CM | POA: Diagnosis not present

## 2023-07-31 DIAGNOSIS — M25552 Pain in left hip: Secondary | ICD-10-CM | POA: Diagnosis not present

## 2023-07-31 DIAGNOSIS — M9904 Segmental and somatic dysfunction of sacral region: Secondary | ICD-10-CM | POA: Diagnosis not present

## 2023-08-01 ENCOUNTER — Ambulatory Visit (HOSPITAL_BASED_OUTPATIENT_CLINIC_OR_DEPARTMENT_OTHER)

## 2023-08-01 ENCOUNTER — Ambulatory Visit (HOSPITAL_BASED_OUTPATIENT_CLINIC_OR_DEPARTMENT_OTHER): Admitting: Student

## 2023-08-01 DIAGNOSIS — S42255A Nondisplaced fracture of greater tuberosity of left humerus, initial encounter for closed fracture: Secondary | ICD-10-CM

## 2023-08-01 DIAGNOSIS — S42295D Other nondisplaced fracture of upper end of left humerus, subsequent encounter for fracture with routine healing: Secondary | ICD-10-CM | POA: Diagnosis not present

## 2023-08-01 NOTE — Progress Notes (Signed)
 Chief Complaint: Left shoulder injury     History of Present Illness:   Crystal Bauer is a 68 y.o. female here today for follow-up and MRI review for a left shoulder injury sustained 4 weeks ago.  Initial x-rays did indicate a fracture of the greater tuberosity.  Patient states that overall she is improving significantly and is having no pain at rest.  She has been able to work out of the sling.  She does still have pain and difficulty with range of motion, particularly forward flexion.   Surgical History:   None  PMH/PSH/Family History/Social History/Meds/Allergies:    Past Medical History:  Diagnosis Date   Arthritis    Atrial fibrillation (HCC) 05/20/1998   from stress/increased caffeine use, resolved   Cancer (HCC)    Cataract    Dysrhythmia    GERD (gastroesophageal reflux disease)    Hearing loss 05/20/2010   left ear   History of chicken pox    History of endometrial cancer    Hyperlipidemia    Hyperplastic colon polyp    Macroprolactinemia    Orthostatic hypotension    Palpitation    Pituitary adenoma (HCC)    micro   Pneumonia    Past Surgical History:  Procedure Laterality Date   ABDOMINAL HYSTERECTOMY     CATARACT EXTRACTION     ROBOTIC ASSISTED TOTAL HYSTERECTOMY WITH BILATERAL SALPINGO OOPHERECTOMY Bilateral 02/26/2023   Procedure: XI ROBOTIC ASSISTED TOTAL HYSTERECTOMY WITH BILATERAL SALPINGO OOPHORECTOMY;  Surgeon: Carver Fila, MD;  Location: [facility];  Service: Gynecology;  Laterality: Bilateral;   SENTINEL NODE BIOPSY N/A 02/26/2023   Procedure: SENTINEL NODE BIOPSY;  Surgeon: Carver Fila, MD;  Location: [facility];  Service: Gynecology;  Laterality: N/A;   TONSILLECTOMY AND ADENOIDECTOMY  1965   Social History   Socioeconomic History   Marital status: Divorced    Spouse name: Not on file   Number of children: 3   Years of education: 12+   Highest education level: Bachelor's degree (e.g., BA, AB,  BS)  Occupational History   Occupation: Teacher, 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  Tobacco Use   Smoking status: Former    Types: E-cigarettes   Smokeless tobacco: Never  Vaping Use   Vaping status: Never Used  Substance and Sexual Activity   Alcohol use: No   Drug use: No   Sexual activity: Not Currently    Birth control/protection: Post-menopausal  Other Topics Concern   Not on file  Social History Narrative   Regular exercise-no Caffeine Use-yes      6 grands   retired   Chief Executive Officer Drivers of Corporate investment banker Strain: Low Risk  (07/04/2023)   Overall Financial Resource Strain (CARDIA)    Difficulty of Paying Living Expenses: Not hard at all  Food Insecurity: No Food Insecurity (07/04/2023)   Hunger Vital Sign    Worried About Running Out of Food in the Last Year: Never true    Ran Out of Food in the Last Year: Never true  Transportation Needs: No Transportation Needs (07/04/2023)   PRAPARE - Administrator, Civil Service (Medical): No    Lack of Transportation (Non-Medical): No  Physical Activity: Insufficiently Active (07/04/2023)   Exercise Vital Sign  Days of Exercise per Week: 3 days    Minutes of Exercise per Session: 20 min  Stress: No Stress Concern Present (07/04/2023)   [name] of Occupational Health - Occupational Stress Questionnaire    Feeling of Stress : Not at all  Social Connections: Socially Isolated (07/04/2023)   Social Connection and Isolation Panel [NHANES]    Frequency of Communication with Friends and Family: Once a week    Frequency of Social Gatherings with Friends and Family: More than three times a week    Attends Religious Services: Never    Database administrator or Organizations: No    Attends Engineer, structural: Not on file    Marital Status: Divorced   Family History  Problem Relation Age of Onset   Colon cancer Mother 35   Stroke Mother 47       10/18/2017    Hyperlipidemia Mother    Breast cancer Mother 63 - 61   Cancer Mother    Early death Father    Arthritis Sister    Diabetes Brother        Type 2 Diabetes   Thyroid cancer Maternal Aunt    Esophageal cancer Maternal Uncle    Diabetes Maternal Grandmother    Hearing loss Maternal Grandmother    Uterine cancer Maternal Grandmother 68 - 79   Stomach cancer Neg Hx    Rectal cancer Neg Hx    Allergies  Allergen Reactions   Erythromycin Shortness Of Breath and Anaphylaxis   Procaine Palpitations    Chest Pain    Quinolones Other (See Comments)    Insomnia Stay awake all night   Neosporin [Bacitracin-Polymyxin B] Dermatitis   Current Outpatient Medications  Medication Sig Dispense Refill   Cholecalciferol 25 MCG (1000 UT) capsule Take 1,000 Units by mouth daily.     Cyanocobalamin (B-12) 1000 MCG CAPS Take 1,000 Units by mouth daily.     estradiol (ESTRACE VAGINAL) 0.1 MG/GM vaginal cream Use this 3 times a week at night.  Please do not use the applicator initially given recent surgery.  Instead, place a pea size amount of the vaginal estrogen on your finger and insert this into the bottom part of the vagina before you go to sleep. 42.5 g 3   Magnesium 400 MG CAPS Take 400 mg by mouth daily.     Na Sulfate-K Sulfate-Mg Sulfate concentrate 17.5-3.13-1.6 GM/177ML SOLN      pantoprazole (PROTONIX) 40 MG tablet TAKE 1 TABLET BY MOUTH 2 TIMES A DAY BEFORE A MEAL 60 tablet 0   Current Facility-Administered Medications  Medication Dose Route Frequency Provider Last Rate Last Admin   denosumab (PROLIA) injection 60 mg  60 mg Subcutaneous Once Jeani Sow, MD       No results found.  Review of Systems:   A ROS was performed including pertinent positives and negatives as documented in the HPI.  Physical Exam :   Constitutional: NAD and appears stated age Neurological: Alert and oriented Psych: Appropriate affect and cooperative There were no vitals taken for this visit.    Comprehensive Musculoskeletal Exam:    Active range of motion of the left shoulder to 40 degrees forward flexion, 70 degrees external rotation, and internal rotation L1.  Tenderness over the lateral deltoid.  Imaging:   Xray (left shoulder 3 views): Healing nondisplaced fracture of the greater tuberosity.  Calcification within the subacromial space.   MRI left shoulder: Nondisplaced fracture of the greater tuberosity.  Rotator cuff  tendons intact.   I personally reviewed and interpreted the radiographs.   Assessment and Plan:   68 year old female 4 weeks status post nondisplaced greater tuberosity fracture.  Review of MRI today does rule out a significant associated rotator cuff injury.  Given this I did discuss treatment protocol for greater tuberosity fractures.  This does include early active range of motion which given her x-rays today that do show early signs of healing, I believe she can gently progress into.  She is planning to work with physical therapy on this.  Advised against any heavy lifting or overhead motion.  I will plan to see her back in another 4 weeks to repeat x-ray and reassess.      I personally saw and evaluated the patient, and participated in the management and treatment plan.  Hazle Nordmann, PA-C Orthopedics

## 2023-08-11 DIAGNOSIS — M9901 Segmental and somatic dysfunction of cervical region: Secondary | ICD-10-CM | POA: Diagnosis not present

## 2023-08-11 DIAGNOSIS — M25552 Pain in left hip: Secondary | ICD-10-CM | POA: Diagnosis not present

## 2023-08-11 DIAGNOSIS — M9904 Segmental and somatic dysfunction of sacral region: Secondary | ICD-10-CM | POA: Diagnosis not present

## 2023-08-11 DIAGNOSIS — M9902 Segmental and somatic dysfunction of thoracic region: Secondary | ICD-10-CM | POA: Diagnosis not present

## 2023-08-12 ENCOUNTER — Telehealth: Payer: Self-pay | Admitting: *Deleted

## 2023-08-12 NOTE — Telephone Encounter (Signed)
 Spoke with the patient and moved appt from 5/2 to 5/15

## 2023-08-13 DIAGNOSIS — M545 Low back pain, unspecified: Secondary | ICD-10-CM | POA: Diagnosis not present

## 2023-08-14 DIAGNOSIS — M9902 Segmental and somatic dysfunction of thoracic region: Secondary | ICD-10-CM | POA: Diagnosis not present

## 2023-08-14 DIAGNOSIS — M9901 Segmental and somatic dysfunction of cervical region: Secondary | ICD-10-CM | POA: Diagnosis not present

## 2023-08-14 DIAGNOSIS — M9904 Segmental and somatic dysfunction of sacral region: Secondary | ICD-10-CM | POA: Diagnosis not present

## 2023-08-14 DIAGNOSIS — M25552 Pain in left hip: Secondary | ICD-10-CM | POA: Diagnosis not present

## 2023-08-20 DIAGNOSIS — M545 Low back pain, unspecified: Secondary | ICD-10-CM | POA: Diagnosis not present

## 2023-08-21 DIAGNOSIS — M9902 Segmental and somatic dysfunction of thoracic region: Secondary | ICD-10-CM | POA: Diagnosis not present

## 2023-08-21 DIAGNOSIS — M9901 Segmental and somatic dysfunction of cervical region: Secondary | ICD-10-CM | POA: Diagnosis not present

## 2023-08-21 DIAGNOSIS — M25552 Pain in left hip: Secondary | ICD-10-CM | POA: Diagnosis not present

## 2023-08-21 DIAGNOSIS — M9904 Segmental and somatic dysfunction of sacral region: Secondary | ICD-10-CM | POA: Diagnosis not present

## 2023-08-26 DIAGNOSIS — M9902 Segmental and somatic dysfunction of thoracic region: Secondary | ICD-10-CM | POA: Diagnosis not present

## 2023-08-26 DIAGNOSIS — M25552 Pain in left hip: Secondary | ICD-10-CM | POA: Diagnosis not present

## 2023-08-26 DIAGNOSIS — M9901 Segmental and somatic dysfunction of cervical region: Secondary | ICD-10-CM | POA: Diagnosis not present

## 2023-08-26 DIAGNOSIS — M9904 Segmental and somatic dysfunction of sacral region: Secondary | ICD-10-CM | POA: Diagnosis not present

## 2023-08-27 DIAGNOSIS — M545 Low back pain, unspecified: Secondary | ICD-10-CM | POA: Diagnosis not present

## 2023-08-28 DIAGNOSIS — M25552 Pain in left hip: Secondary | ICD-10-CM | POA: Diagnosis not present

## 2023-08-28 DIAGNOSIS — M9901 Segmental and somatic dysfunction of cervical region: Secondary | ICD-10-CM | POA: Diagnosis not present

## 2023-08-28 DIAGNOSIS — M9904 Segmental and somatic dysfunction of sacral region: Secondary | ICD-10-CM | POA: Diagnosis not present

## 2023-08-28 DIAGNOSIS — M9902 Segmental and somatic dysfunction of thoracic region: Secondary | ICD-10-CM | POA: Diagnosis not present

## 2023-08-29 ENCOUNTER — Ambulatory Visit (HOSPITAL_BASED_OUTPATIENT_CLINIC_OR_DEPARTMENT_OTHER): Admitting: Student

## 2023-09-02 DIAGNOSIS — M9904 Segmental and somatic dysfunction of sacral region: Secondary | ICD-10-CM | POA: Diagnosis not present

## 2023-09-02 DIAGNOSIS — M9902 Segmental and somatic dysfunction of thoracic region: Secondary | ICD-10-CM | POA: Diagnosis not present

## 2023-09-02 DIAGNOSIS — M25552 Pain in left hip: Secondary | ICD-10-CM | POA: Diagnosis not present

## 2023-09-02 DIAGNOSIS — M9901 Segmental and somatic dysfunction of cervical region: Secondary | ICD-10-CM | POA: Diagnosis not present

## 2023-09-03 ENCOUNTER — Encounter (HOSPITAL_BASED_OUTPATIENT_CLINIC_OR_DEPARTMENT_OTHER): Payer: Self-pay | Admitting: Student

## 2023-09-03 ENCOUNTER — Ambulatory Visit (HOSPITAL_BASED_OUTPATIENT_CLINIC_OR_DEPARTMENT_OTHER)

## 2023-09-03 ENCOUNTER — Ambulatory Visit (HOSPITAL_BASED_OUTPATIENT_CLINIC_OR_DEPARTMENT_OTHER): Admitting: Student

## 2023-09-03 DIAGNOSIS — M545 Low back pain, unspecified: Secondary | ICD-10-CM | POA: Diagnosis not present

## 2023-09-03 DIAGNOSIS — S42255A Nondisplaced fracture of greater tuberosity of left humerus, initial encounter for closed fracture: Secondary | ICD-10-CM | POA: Diagnosis not present

## 2023-09-03 DIAGNOSIS — S42292D Other displaced fracture of upper end of left humerus, subsequent encounter for fracture with routine healing: Secondary | ICD-10-CM | POA: Diagnosis not present

## 2023-09-03 DIAGNOSIS — S42302D Unspecified fracture of shaft of humerus, left arm, subsequent encounter for fracture with routine healing: Secondary | ICD-10-CM | POA: Diagnosis not present

## 2023-09-03 NOTE — Progress Notes (Signed)
 Chief Complaint: Left shoulder injury     History of Present Illness:   09/03/23: Patient presents to clinic today for follow-up of a nondisplaced left greater tuberosity fracture that occurred approximately 2 months ago.  Since her last follow-up, she states that she has been steadily improving.  Range of motion has improved some with the assistance of physical therapy, however she does continue to lack ability to fully raise her arm.  Pain is well-controlled without use pain medication.   08/01/23: Crystal Bauer is a 68 y.o. female here today for follow-up and MRI review for a left shoulder injury sustained 4 weeks ago.  Initial x-rays did indicate a fracture of the greater tuberosity.  Patient states that overall she is improving significantly and is having no pain at rest.  She has been able to work out of the sling.  She does still have pain and difficulty with range of motion, particularly forward flexion.   Surgical History:   None  PMH/PSH/Family History/Social History/Meds/Allergies:    Past Medical History:  Diagnosis Date   Arthritis    Atrial fibrillation (HCC) 05/20/1998   from stress/increased caffeine use, resolved   Cancer (HCC)    Cataract    Dysrhythmia    GERD (gastroesophageal reflux disease)    Hearing loss 05/20/2010   left ear   History of chicken pox    History of endometrial cancer    Hyperlipidemia    Hyperplastic colon polyp    Macroprolactinemia    Orthostatic hypotension    Palpitation    Pituitary adenoma (HCC)    micro   Pneumonia    Past Surgical History:  Procedure Laterality Date   ABDOMINAL HYSTERECTOMY     CATARACT EXTRACTION     ROBOTIC ASSISTED TOTAL HYSTERECTOMY WITH BILATERAL SALPINGO OOPHERECTOMY Bilateral 02/26/2023   Procedure: XI ROBOTIC ASSISTED TOTAL HYSTERECTOMY WITH BILATERAL SALPINGO OOPHORECTOMY;  Surgeon: Carver Fila, MD;  Location: [facility];  Service: Gynecology;  Laterality:  Bilateral;   SENTINEL NODE BIOPSY N/A 02/26/2023   Procedure: SENTINEL NODE BIOPSY;  Surgeon: Carver Fila, MD;  Location: [facility];  Service: Gynecology;  Laterality: N/A;   TONSILLECTOMY AND ADENOIDECTOMY  1965   Social History   Socioeconomic History   Marital status: Divorced    Spouse name: Not on file   Number of children: 3   Years of education: 12+   Highest education level: Bachelor's degree (e.g., BA, AB, BS)  Occupational History   Occupation: Teacher, 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  Tobacco Use   Smoking status: Former    Types: E-cigarettes   Smokeless tobacco: Never  Vaping Use   Vaping status: Never Used  Substance and Sexual Activity   Alcohol use: No   Drug use: No   Sexual activity: Not Currently    Birth control/protection: Post-menopausal  Other Topics Concern   Not on file  Social History Narrative   Regular exercise-no Caffeine Use-yes      6 grands   retired   Social Drivers of [employer] Strain: Low Risk  (07/04/2023)   Overall Financial Resource Strain (CARDIA)    Difficulty of Paying Living Expenses: Not hard at all  Food Insecurity: No Food Insecurity (07/04/2023)   Hunger Vital Sign  Worried About Programme researcher, broadcasting/film/video in the Last Year: Never true    Ran Out of Food in the Last Year: Never true  Transportation Needs: No Transportation Needs (07/04/2023)   PRAPARE - Administrator, Civil Service (Medical): No    Lack of Transportation (Non-Medical): No  Physical Activity: Insufficiently Active (07/04/2023)   Exercise Vital Sign    Days of Exercise per Week: 3 days    Minutes of Exercise per Session: 20 min  Stress: No Stress Concern Present (07/04/2023)   [name] of Occupational Health - Occupational Stress Questionnaire    Feeling of Stress : Not at all  Social Connections: Socially Isolated (07/04/2023)   Social Connection and Isolation Panel [NHANES]    Frequency  of Communication with Friends and Family: Once a week    Frequency of Social Gatherings with Friends and Family: More than three times a week    Attends Religious Services: Never    Database administrator or Organizations: No    Attends Engineer, structural: Not on file    Marital Status: Divorced   Family History  Problem Relation Age of Onset   Colon cancer Mother 26   Stroke Mother 30       10/18/2017   Hyperlipidemia Mother    Breast cancer Mother 27 - 54   Cancer Mother    Early death Father    Arthritis Sister    Diabetes Brother        Type 2 Diabetes   Thyroid cancer Maternal Aunt    Esophageal cancer Maternal Uncle    Diabetes Maternal Grandmother    Hearing loss Maternal Grandmother    Uterine cancer Maternal Grandmother 55 - 79   Stomach cancer Neg Hx    Rectal cancer Neg Hx    Allergies  Allergen Reactions   Erythromycin Shortness Of Breath and Anaphylaxis   Procaine Palpitations    Chest Pain    Quinolones Other (See Comments)    Insomnia Stay awake all night   Neosporin [Bacitracin-Polymyxin B] Dermatitis   Current Outpatient Medications  Medication Sig Dispense Refill   Cholecalciferol 25 MCG (1000 UT) capsule Take 1,000 Units by mouth daily.     Cyanocobalamin (B-12) 1000 MCG CAPS Take 1,000 Units by mouth daily.     estradiol (ESTRACE VAGINAL) 0.1 MG/GM vaginal cream Use this 3 times a week at night.  Please do not use the applicator initially given recent surgery.  Instead, place a pea size amount of the vaginal estrogen on your finger and insert this into the bottom part of the vagina before you go to sleep. 42.5 g 3   Magnesium 400 MG CAPS Take 400 mg by mouth daily.     Na Sulfate-K Sulfate-Mg Sulfate concentrate 17.5-3.13-1.6 GM/177ML SOLN      pantoprazole (PROTONIX) 40 MG tablet TAKE 1 TABLET BY MOUTH 2 TIMES A DAY BEFORE A MEAL 60 tablet 0   Current Facility-Administered Medications  Medication Dose Route Frequency Provider Last Rate  Last Admin   denosumab (PROLIA) injection 60 mg  60 mg Subcutaneous Once Jeani Sow, MD       No results found.  Review of Systems:   A ROS was performed including pertinent positives and negatives as documented in the HPI.  Physical Exam :   Constitutional: NAD and appears stated age Neurological: Alert and oriented Psych: Appropriate affect and cooperative There were no vitals taken for this visit.   Comprehensive Musculoskeletal  Exam:    No significant tenderness with palpation throughout the left shoulder.  Active range of motion to 90 degrees abduction, 80 degrees forward flexion, 70 degrees external rotation, and internal rotation L1.  Distal neurosensory exam is intact.  Imaging:   Xray (left shoulder 3 views): Nondisplaced greater tuberosity fracture with evidence of increased internal callus formation.    I personally reviewed and interpreted the radiographs.   Assessment and Plan:   68 year old female now 8 weeks status post nondisplaced greater tuberosity fracture.  X-rays taken today continue to show progression of bone healing.  She has been working with physical therapy and has seen some improvements with range of motion, although she does continue to lack significantly with forward flexion.  Discussed that her fracture does appear stable and therefore I would like for her to continue working with PT with particular focus on regaining ROM followed by strengthening.  As repeat x-rays will likely not be beneficial, she can plan to return to clinic as needed, particularly if symptoms worsen or persist or if she fails to progress with PT.     I personally saw and evaluated the patient, and participated in the management and treatment plan.  Sharrell Deck, PA-C Orthopedics

## 2023-09-10 DIAGNOSIS — M9901 Segmental and somatic dysfunction of cervical region: Secondary | ICD-10-CM | POA: Diagnosis not present

## 2023-09-10 DIAGNOSIS — M25552 Pain in left hip: Secondary | ICD-10-CM | POA: Diagnosis not present

## 2023-09-10 DIAGNOSIS — M545 Low back pain, unspecified: Secondary | ICD-10-CM | POA: Diagnosis not present

## 2023-09-10 DIAGNOSIS — M9902 Segmental and somatic dysfunction of thoracic region: Secondary | ICD-10-CM | POA: Diagnosis not present

## 2023-09-10 DIAGNOSIS — M9904 Segmental and somatic dysfunction of sacral region: Secondary | ICD-10-CM | POA: Diagnosis not present

## 2023-09-17 DIAGNOSIS — M545 Low back pain, unspecified: Secondary | ICD-10-CM | POA: Diagnosis not present

## 2023-09-19 ENCOUNTER — Ambulatory Visit: Payer: Medicare PPO | Admitting: Gynecologic Oncology

## 2023-09-20 ENCOUNTER — Other Ambulatory Visit: Payer: Self-pay | Admitting: Internal Medicine

## 2023-09-30 ENCOUNTER — Encounter: Payer: Self-pay | Admitting: Gynecologic Oncology

## 2023-10-01 DIAGNOSIS — M545 Low back pain, unspecified: Secondary | ICD-10-CM | POA: Diagnosis not present

## 2023-10-02 ENCOUNTER — Inpatient Hospital Stay: Payer: Self-pay | Attending: Gynecologic Oncology | Admitting: Gynecologic Oncology

## 2023-10-02 ENCOUNTER — Encounter: Payer: Self-pay | Admitting: Gynecologic Oncology

## 2023-10-02 VITALS — BP 111/71 | HR 69 | Temp 99.2°F | Resp 20 | Wt 204.2 lb

## 2023-10-02 DIAGNOSIS — C541 Malignant neoplasm of endometrium: Secondary | ICD-10-CM

## 2023-10-02 DIAGNOSIS — Z90722 Acquired absence of ovaries, bilateral: Secondary | ICD-10-CM | POA: Insufficient documentation

## 2023-10-02 DIAGNOSIS — Z90710 Acquired absence of both cervix and uterus: Secondary | ICD-10-CM | POA: Diagnosis not present

## 2023-10-02 DIAGNOSIS — Z8542 Personal history of malignant neoplasm of other parts of uterus: Secondary | ICD-10-CM | POA: Diagnosis not present

## 2023-10-02 DIAGNOSIS — Z9079 Acquired absence of other genital organ(s): Secondary | ICD-10-CM | POA: Diagnosis not present

## 2023-10-02 NOTE — Patient Instructions (Signed)
 It was good to see you today.  I do not see or feel any evidence of cancer recurrence on your exam.  Please change your visit with your OB/GYN to November of this year.  We will see you for follow-up in 12 months.  As always, if you develop any new and concerning symptoms before your next visit, please call to see me sooner.

## 2023-10-02 NOTE — Progress Notes (Signed)
 Gynecologic Oncology Return Clinic Visit  10/02/23  Reason for Visit: follow-up   Treatment History: She was seen initially in July with PMB. She was undergoing work-up with Urology for hematuria, CT showed endometrial thickening.  She underwent saline infusion sonogram on 8/21 confirming thickened endometrium.    Biopsy was performed on 01/08/23 and pathology revealed endometrioid adenocarcinoma, FIGO grade 1.   02/26/23: Robotic-assisted laparoscopic total hysterectomy with bilateral salpingoophorectomy, SLN biopsy bilaterally  Interval History: Doing well.  Denies any vaginal bleeding or discharge.  Reports baseline bowel bladder function.  Denies any abdominal pain.  Crystal Bauer several months ago and fractured her left shoulder, working with PT.  Past Medical/Surgical History: Past Medical History:  Diagnosis Date   Arthritis    Atrial fibrillation (HCC) 05/20/1998   from stress/increased caffeine use, resolved   Cancer (HCC)    Cataract    Dysrhythmia    GERD (gastroesophageal reflux disease)    Hearing loss 05/20/2010   left ear   History of chicken pox    History of endometrial cancer    Hyperlipidemia    Hyperplastic colon polyp    Macroprolactinemia    Orthostatic hypotension    Palpitation    Pituitary adenoma (HCC)    micro   Pneumonia     Past Surgical History:  Procedure Laterality Date   ABDOMINAL HYSTERECTOMY     CATARACT EXTRACTION     ROBOTIC ASSISTED TOTAL HYSTERECTOMY WITH BILATERAL SALPINGO OOPHERECTOMY Bilateral 02/26/2023   Procedure: XI ROBOTIC ASSISTED TOTAL HYSTERECTOMY WITH BILATERAL SALPINGO OOPHORECTOMY;  Surgeon: Suzi Essex, MD;  Location: [facility];  Service: Gynecology;  Laterality: Bilateral;   SENTINEL NODE BIOPSY N/A 02/26/2023   Procedure: SENTINEL NODE BIOPSY;  Surgeon: Suzi Essex, MD;  Location: [facility];  Service: Gynecology;  Laterality: N/A;   TONSILLECTOMY AND ADENOIDECTOMY  1965    Family History  Problem Relation  Age of Onset   Colon cancer Mother 62   Stroke Mother 93       10/18/2017   Hyperlipidemia Mother    Breast cancer Mother 77 - 9   Cancer Mother    Early death Father    Arthritis Sister    Diabetes Brother        Type 2 Diabetes   Thyroid  cancer Maternal Aunt    Esophageal cancer Maternal Uncle    Diabetes Maternal Grandmother    Hearing loss Maternal Grandmother    Uterine cancer Maternal Grandmother 21 - 79   Stomach cancer Neg Hx    Rectal cancer Neg Hx     Social History   Socioeconomic History   Marital status: Divorced    Spouse name: Not on file   Number of children: 3   Years of education: 12+   Highest education level: Bachelor's degree (e.g., BA, AB, BS)  Occupational History   Occupation: Teacher, 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  Tobacco Use   Smoking status: Former    Types: E-cigarettes   Smokeless tobacco: Never  Vaping Use   Vaping status: Never Used  Substance and Sexual Activity   Alcohol use: No   Drug use: No   Sexual activity: Not Currently    Birth control/protection: Post-menopausal  Other Topics Concern   Not on file  Social History Narrative   Regular exercise-no Caffeine Use-yes      6 grands   retired   Chief Executive Officer Drivers of Corporate investment banker Strain: Low Risk  (  07/04/2023)   Overall Financial Resource Strain (CARDIA)    Difficulty of Paying Living Expenses: Not hard at all  Food Insecurity: No Food Insecurity (07/04/2023)   Hunger Vital Sign    Worried About Running Out of Food in the Last Year: Never true    Ran Out of Food in the Last Year: Never true  Transportation Needs: No Transportation Needs (07/04/2023)   PRAPARE - Administrator, Civil Service (Medical): No    Lack of Transportation (Non-Medical): No  Physical Activity: Insufficiently Active (07/04/2023)   Exercise Vital Sign    Days of Exercise per Week: 3 days    Minutes of Exercise per Session: 20 min  Stress: No Stress  Concern Present (07/04/2023)   [name] of Occupational Health - Occupational Stress Questionnaire    Feeling of Stress : Not at all  Social Connections: Socially Isolated (07/04/2023)   Social Connection and Isolation Panel [NHANES]    Frequency of Communication with Friends and Family: Once a week    Frequency of Social Gatherings with Friends and Family: More than three times a week    Attends Religious Services: Never    Database administrator or Organizations: No    Attends Engineer, structural: Not on file    Marital Status: Divorced    Current Medications:  Current Outpatient Medications:    Cholecalciferol 25 MCG (1000 UT) capsule, Take 1,000 Units by mouth daily., Disp: , Rfl:    Cyanocobalamin  (B-12) 1000 MCG CAPS, Take 1,000 Units by mouth daily., Disp: , Rfl:    estradiol  (ESTRACE  VAGINAL) 0.1 MG/GM vaginal cream, Use this 3 times a week at night.  Please do not use the applicator initially given recent surgery.  Instead, place a pea size amount of the vaginal estrogen on your finger and insert this into the bottom part of the vagina before you go to sleep., Disp: 42.5 g, Rfl: 3   Magnesium 400 MG CAPS, Take 400 mg by mouth daily., Disp: , Rfl:    pantoprazole  (PROTONIX ) 40 MG tablet, TAKE 1 TABLET BY MOUTH 2 TIMES A DAY BEFORE A MEAL NEED APPOINTMENT FOR FURTHER REFILLS, Disp: 60 tablet, Rfl: 0  Current Facility-Administered Medications:    denosumab  (PROLIA ) injection 60 mg, 60 mg, Subcutaneous, Once, Christel Cousins, MD  Review of Systems: Denies appetite changes, fevers, chills, fatigue, unexplained weight changes. Denies hearing loss, neck lumps or masses, mouth sores, ringing in ears or voice changes. Denies cough or wheezing.  Denies shortness of breath. Denies chest pain or palpitations. Denies leg swelling. Denies abdominal distention, pain, blood in stools, constipation, diarrhea, nausea, vomiting, or early satiety. Denies pain with intercourse,  dysuria, frequency, hematuria or incontinence. Denies hot flashes, pelvic pain, vaginal bleeding or vaginal discharge.   Denies joint pain, back pain or muscle pain/cramps. Denies itching, rash, or wounds. Denies dizziness, headaches, numbness or seizures. Denies swollen lymph nodes or glands, denies easy bruising or bleeding. Denies anxiety, depression, confusion, or decreased concentration.  Physical Exam: BP 111/71   Pulse 69   Temp 99.2 F (37.3 C)   Resp 20   Wt 204 lb 3.2 oz (92.6 kg)   SpO2 99%   BMI 31.05 kg/m  General: Alert, oriented, no acute distress. HEENT: Posterior oropharynx clear, sclera anicteric. Chest: Clear to auscultation bilaterally.  No wheezes or rhonchi. Cardiovascular: Regular rate and rhythm, no murmurs. Abdomen: soft, nontender.  Normoactive bowel sounds.  No masses or hepatosplenomegaly appreciated.  Well-healed incisions.  Extremities: Grossly normal range of motion.  Warm, well perfused.  No edema bilaterally. Skin: No rashes or lesions noted. Lymphatics: No cervical, supraclavicular, or inguinal adenopathy. GU: Normal appearing external genitalia without erythema, excoriation, or lesions.  Speculum exam with a small speculum reveals cuff intact, no lesions.  Speculum exam much better tolerated today.  Bimanual exam reveals intact, no masses or nodularity.    Laboratory & Radiologic Studies: None new  Assessment & Plan: Crystal Bauer is a 68 y.o. 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  Left fallopian tube with focal atypical hyperplasia. P53 WT, MMR with loss of MLH1/PMS2. MSI-H, MHL1 promoter hypermethylation present. Germline: negative.  Doing well.  She is NED on exam today.  She has been using vaginal estrogen.  Much less uncomfortable on pelvic exam.  Per NCCN surveillance recommendations, we will continue with visits every 6 months alternating between my office and her OB/GYN.   We will see the patient for follow-up in 12 months.  Have asked her to reach out to her OB/GYN to reschedule follow-up in 6 months (currently scheduled in August).   We reviewed signs and symptoms that would be concerning for cancer recurrence.  The patient was asked to call the office if she develops any of these between visits.  20 minutes of total time was spent for this patient encounter, including preparation, face-to-face counseling with the patient and coordination of care, and documentation of the encounter.  Wiley Hanger, MD  Division of Gynecologic Oncology  Department of Obstetrics and Gynecology  [facility]

## 2023-10-08 DIAGNOSIS — M9904 Segmental and somatic dysfunction of sacral region: Secondary | ICD-10-CM | POA: Diagnosis not present

## 2023-10-08 DIAGNOSIS — M25552 Pain in left hip: Secondary | ICD-10-CM | POA: Diagnosis not present

## 2023-10-08 DIAGNOSIS — M9902 Segmental and somatic dysfunction of thoracic region: Secondary | ICD-10-CM | POA: Diagnosis not present

## 2023-10-08 DIAGNOSIS — M9901 Segmental and somatic dysfunction of cervical region: Secondary | ICD-10-CM | POA: Diagnosis not present

## 2023-10-15 DIAGNOSIS — M545 Low back pain, unspecified: Secondary | ICD-10-CM | POA: Diagnosis not present

## 2023-10-22 DIAGNOSIS — M545 Low back pain, unspecified: Secondary | ICD-10-CM | POA: Diagnosis not present

## 2023-10-23 DIAGNOSIS — L814 Other melanin hyperpigmentation: Secondary | ICD-10-CM | POA: Diagnosis not present

## 2023-10-23 DIAGNOSIS — D225 Melanocytic nevi of trunk: Secondary | ICD-10-CM | POA: Diagnosis not present

## 2023-10-23 DIAGNOSIS — L728 Other follicular cysts of the skin and subcutaneous tissue: Secondary | ICD-10-CM | POA: Diagnosis not present

## 2023-10-23 DIAGNOSIS — L821 Other seborrheic keratosis: Secondary | ICD-10-CM | POA: Diagnosis not present

## 2023-10-28 ENCOUNTER — Telehealth: Payer: Self-pay | Admitting: Internal Medicine

## 2023-10-28 MED ORDER — PANTOPRAZOLE SODIUM 40 MG PO TBEC: DELAYED_RELEASE_TABLET | ORAL | 0 refills | Status: DC

## 2023-10-28 NOTE — Telephone Encounter (Signed)
 Prescription sent to patient's pharmacy until scheduled appt.

## 2023-10-28 NOTE — Telephone Encounter (Signed)
 Requesting medication refill for protonix .   Patient scheduled for 6/17 with pa.   Please advise.

## 2023-10-29 DIAGNOSIS — M545 Low back pain, unspecified: Secondary | ICD-10-CM | POA: Diagnosis not present

## 2023-11-03 NOTE — Progress Notes (Signed)
 Brigitte Canard, PA-C [address] [place], [place]  [address] Phone: [phone]   Primary Care Physician: Christel Cousins, MD  Primary Gastroenterologist:  Brigitte Canard, PA-C / Dr. Laurell Pond   Chief Complaint:  Medication Refill; Repeat Screening Colonoscopy      HPI:   Crystal Bauer is a 67 y.o. female, established patient Dr. Bridgett Camps, presents for annual follow-up of GERD with esophagitis and Barrett's esophagus.  Currently taking pantoprazole  40 Mg twice daily and needs medication refill.  Current Symptoms: If she misses just 1 dose of Pantoprazole , then she has heartburn and acid reflux (worse at night).  On Pantoprazole  40mg  BID, her acid reflux is controlled.  She denies heartburn or dysphagia on medication.  She denies adverse side effects.  Rarely drinks alcohol.  She tries to avoid GERD trigger foods and drinks.  She has annual labwork monitored through PCP.  Renal function normal.  Takes Vitamin D , Magnesium, and B12 - all monitored through PCP.  She has mild constipation.  Denies any other GI symptoms such as melena or hematochezia.    08/2022 last EGD by Dr. Bridgett Camps: Previous esophagitis had healed.  4 cm hiatal hernia.  Normal stomach and duodenum.  Biopsies showed Barrett's esophagus without dysplasia.  06/2022 EGD by Dr. Bridgett Camps: LA grade B reflux esophagitis, 4 cm hiatal hernia.  Normal stomach and duodenum.  04/2022 EGD by Dr. Bridgett Camps: Grade D reflux esophagitis  11/2012 last colonoscopy: Diminutive hyperplastic rectal polyp removed.  10-year repeat (is overdue).  Current Outpatient Medications  Medication Sig Dispense Refill   Cholecalciferol 25 MCG (1000 UT) capsule Take 1,000 Units by mouth daily.     Cyanocobalamin  (B-12) 1000 MCG CAPS Take 1,000 Units by mouth daily.     estradiol  (ESTRACE  VAGINAL) 0.1 MG/GM vaginal cream Use this 3 times a week at night.  Please do not use the applicator initially given recent surgery.  Instead, place a pea size amount  of the vaginal estrogen on your finger and insert this into the bottom part of the vagina before you go to sleep. 42.5 g 3   Magnesium 400 MG CAPS Take 400 mg by mouth daily.     pantoprazole  (PROTONIX ) 40 MG tablet Take 1 tablet (40 mg total) by mouth 2 (two) times daily. TAKE 1 TABLET BY MOUTH 2 TIMES A DAY BEFORE A MEAL 180 tablet 3   Current Facility-Administered Medications  Medication Dose Route Frequency Provider Last Rate Last Admin   denosumab  (PROLIA ) injection 60 mg  60 mg Subcutaneous Once Kulik, Ann Marie, MD        Allergies as of 11/04/2023 - Review Complete 11/04/2023  Allergen Reaction Noted   Erythromycin Shortness Of Breath and Anaphylaxis 07/23/2011   Procaine Palpitations 02/21/2021   Quinolones Other (See Comments) 07/23/2011   Neosporin [bacitracin-polymyxin b] Dermatitis 02/13/2023    Past Medical History:  Diagnosis Date   Arthritis    Atrial fibrillation (HCC) 05/20/1998   from stress/increased caffeine use, resolved   Cancer (HCC)    Cataract    Dysrhythmia    GERD (gastroesophageal reflux disease)    Hearing loss 05/20/2010   left ear   History of chicken pox    History of endometrial cancer    Hyperlipidemia    Hyperplastic colon polyp    Macroprolactinemia    Orthostatic hypotension    Palpitation    Pituitary adenoma (HCC)    micro   Pneumonia     Past  Surgical History:  Procedure Laterality Date   ABDOMINAL HYSTERECTOMY     CATARACT EXTRACTION     ROBOTIC ASSISTED TOTAL HYSTERECTOMY WITH BILATERAL SALPINGO OOPHERECTOMY Bilateral 02/26/2023   Procedure: XI ROBOTIC ASSISTED TOTAL HYSTERECTOMY WITH BILATERAL SALPINGO OOPHORECTOMY;  Surgeon: Suzi Essex, MD;  Location: [facility];  Service: Gynecology;  Laterality: Bilateral;   SENTINEL NODE BIOPSY N/A 02/26/2023   Procedure: SENTINEL NODE BIOPSY;  Surgeon: Suzi Essex, MD;  Location: [facility];  Service: Gynecology;  Laterality: N/A;   TONSILLECTOMY AND ADENOIDECTOMY  1965     Review of Systems:    All systems reviewed and negative except where noted in HPI.    Physical Exam:  BP (!) 82/54 (BP Location: Left Arm, Patient Position: Sitting, Cuff Size: Large)   Pulse 68   Ht 5' 7.5 (1.715 m) Comment: height measured without shoes  Wt 207 lb 4 oz (94 kg)   BMI 31.98 kg/m  No LMP recorded. Patient is postmenopausal.  General: Well-nourished, well-developed in no acute distress.  Lungs: Clear to auscultation bilaterally. Non-labored. Heart: Regular rate and rhythm, no murmurs rubs or gallops.  Abdomen: Bowel sounds are normal; Abdomen is Soft; No hepatosplenomegaly, masses or hernias;  No Abdominal Tenderness; No guarding or rebound tenderness. Neuro: Alert and oriented x 3.  Grossly intact.  Psych: Alert and cooperative, normal mood and affect.   Imaging Studies: No results found.  Labs: CBC    Component Value Date/Time   WBC 9.2 02/19/2023 1334   RBC 4.71 02/19/2023 1334   HGB 15.1 (H) 02/19/2023 1334   HCT 46.6 (H) 02/19/2023 1334   PLT 318 02/19/2023 1334   MCV 98.9 02/19/2023 1334   MCV 92.1 12/23/2015 1525   MCH 32.1 02/19/2023 1334   MCHC 32.4 02/19/2023 1334   RDW 13.2 02/19/2023 1334    CMP     Component Value Date/Time   NA 139 02/19/2023 1334   NA 144 11/17/2017 1343   K 5.1 02/19/2023 1334   CL 105 02/19/2023 1334   CO2 26 02/19/2023 1334   GLUCOSE 97 02/19/2023 1334   BUN 21 02/19/2023 1334   BUN 14 11/17/2017 1343   CREATININE 0.70 02/19/2023 1334   CREATININE 0.84 12/23/2015 1518   CALCIUM 9.8 02/19/2023 1334   PROT 7.2 02/19/2023 1334   PROT 6.7 11/17/2017 1343   ALBUMIN 4.1 02/19/2023 1334   ALBUMIN 4.4 11/17/2017 1343   AST 21 02/19/2023 1334   ALT 28 02/19/2023 1334   ALKPHOS 61 02/19/2023 1334   BILITOT 0.3 02/19/2023 1334   BILITOT 0.3 11/17/2017 1343   GFRNONAA >60 02/19/2023 1334   GFRAA >60 11/22/2019 1514       Assessment and Plan:   Crystal Bauer is a 68 y.o. y/o female returns for  follow-up of:  1.  Severe gastroesophageal reflux disease with esophagitis; has required high-dose PPI for treatment. - Continue pantoprazole  40 Mg 1 tablet twice daily, #180, 3 refills. - Recommend Lifestyle Modifications to prevent Acid Reflux.  Rec. Avoid coffee, sodas, peppermint, garlic, onions, alcohol, citrus fruits, chocolate, tomatoes, fatty and spicey foods.  Avoid eating 2-3 hours before bedtime.  - We discussed adverse side effects of PPIs to include vitamin deficiencies, osteoporosis, renal insufficiency.  Recommend take lowest effective dose of PPI necessary to control acid reflux.  OK to add H2RB (Pepcid 20mg  daily) or antiacid if needed for breakthrough acid reflux.    2.  Barrett's esophagus - 3 year Repeat surveillance EGD will be  due 08/2025.  3.  Colon cancer screening: Last colonoscopy in 2014 was negative.  Overdue for 10-year repeat. - Scheduling Colonoscopy I discussed risks of colonoscopy with patient to include risk of bleeding, colon perforation, and risk of sedation.  Patient expressed understanding and agrees to proceed with colonoscopy.   4.  Mild Constipation - Start OTC Miralax Powder; Mix 1 capful in 6 to 8 ounces of a drink once daily. - Recommend high-fiber diet, 30 g of fiber daily - Eat fruits, vegetables, and whole grains - Drink 64 ounces of water  / fluids daily.   Brigitte Canard, PA-C  Follow up in 1 - 2 years.

## 2023-11-04 ENCOUNTER — Ambulatory Visit (INDEPENDENT_AMBULATORY_CARE_PROVIDER_SITE_OTHER): Admitting: Physician Assistant

## 2023-11-04 ENCOUNTER — Encounter: Payer: Self-pay | Admitting: Physician Assistant

## 2023-11-04 ENCOUNTER — Other Ambulatory Visit (HOSPITAL_COMMUNITY): Payer: Self-pay

## 2023-11-04 VITALS — BP 82/54 | HR 68 | Ht 67.5 in | Wt 207.2 lb

## 2023-11-04 DIAGNOSIS — K5900 Constipation, unspecified: Secondary | ICD-10-CM | POA: Diagnosis not present

## 2023-11-04 DIAGNOSIS — K2270 Barrett's esophagus without dysplasia: Secondary | ICD-10-CM

## 2023-11-04 DIAGNOSIS — K2100 Gastro-esophageal reflux disease with esophagitis, without bleeding: Secondary | ICD-10-CM | POA: Diagnosis not present

## 2023-11-04 DIAGNOSIS — Z1211 Encounter for screening for malignant neoplasm of colon: Secondary | ICD-10-CM

## 2023-11-04 MED ORDER — NA SULFATE-K SULFATE-MG SULF 17.5-3.13-1.6 GM/177ML PO SOLN: 1.0000 | Freq: Once | ORAL | 0 refills | Status: DC

## 2023-11-04 MED ORDER — PANTOPRAZOLE SODIUM 40 MG PO TBEC: 40.0000 mg | DELAYED_RELEASE_TABLET | Freq: Two times a day (BID) | ORAL | 3 refills | Status: AC

## 2023-11-04 NOTE — Patient Instructions (Signed)
 We have sent the following medications to your pharmacy for you to pick up at your convenience: Pantoprazole  40mg  twice daily  You have been scheduled for a colonoscopy. Please follow written instructions given to you at your visit today.   If you use inhalers (even only as needed), please bring them with you on the day of your procedure.  DO NOT TAKE 7 DAYS PRIOR TO TEST- Trulicity (dulaglutide) Ozempic, Wegovy (semaglutide) Mounjaro (tirzepatide) Bydureon Bcise (exanatide extended release)  DO NOT TAKE 1 DAY PRIOR TO YOUR TEST Rybelsus (semaglutide) Adlyxin (lixisenatide) Victoza (liraglutide) Byetta (exanatide) ___________________________________________________________________________  Please follow up sooner if symptoms increase or worsen   Due to recent changes in healthcare laws, you may see the results of your imaging and laboratory studies on MyChart before your provider has had a chance to review them.  We understand that in some cases there may be results that are confusing or concerning to you. Not all laboratory results come back in the same time frame and the provider may be waiting for multiple results in order to interpret others.  Please give us  48 hours in order for your provider to thoroughly review all the results before contacting the office for clarification of your results.   Thank you for trusting me with your gastrointestinal care!   Brigitte Canard, PA-C _______________________________________________________  If your blood pressure at your visit was 140/90 or greater, please contact your primary care physician to follow up on this.  _______________________________________________________  If you are age 48 or older, your body mass index should be between 23-30. Your Body mass index is 31.98 kg/m. If this is out of the aforementioned range listed, please consider follow up with your Primary Care Provider.  If you are age 15 or younger, your body mass index  should be between 19-25. Your Body mass index is 31.98 kg/m. If this is out of the aformentioned range listed, please consider follow up with your Primary Care Provider.   ________________________________________________________  The [facility] providers would like to encourage you to use MYCHART to communicate with providers for non-urgent requests or questions.  Due to long hold times on the telephone, sending your provider a message by [facility] may be a faster and more efficient way to get a response.  Please allow 48 business hours for a response.  Please remember that this is for non-urgent requests.  _______________________________________________________

## 2023-11-05 DIAGNOSIS — M9901 Segmental and somatic dysfunction of cervical region: Secondary | ICD-10-CM | POA: Diagnosis not present

## 2023-11-05 DIAGNOSIS — M9902 Segmental and somatic dysfunction of thoracic region: Secondary | ICD-10-CM | POA: Diagnosis not present

## 2023-11-05 DIAGNOSIS — M9904 Segmental and somatic dysfunction of sacral region: Secondary | ICD-10-CM | POA: Diagnosis not present

## 2023-11-05 DIAGNOSIS — M25552 Pain in left hip: Secondary | ICD-10-CM | POA: Diagnosis not present

## 2023-11-23 NOTE — Progress Notes (Signed)
 Addendum: Reviewed and agree with assessment and management plan. Asha Grumbine, [name], MD

## 2023-12-03 DIAGNOSIS — M25552 Pain in left hip: Secondary | ICD-10-CM | POA: Diagnosis not present

## 2023-12-03 DIAGNOSIS — M9904 Segmental and somatic dysfunction of sacral region: Secondary | ICD-10-CM | POA: Diagnosis not present

## 2023-12-03 DIAGNOSIS — M545 Low back pain, unspecified: Secondary | ICD-10-CM | POA: Diagnosis not present

## 2023-12-03 DIAGNOSIS — M9902 Segmental and somatic dysfunction of thoracic region: Secondary | ICD-10-CM | POA: Diagnosis not present

## 2023-12-03 DIAGNOSIS — M9901 Segmental and somatic dysfunction of cervical region: Secondary | ICD-10-CM | POA: Diagnosis not present

## 2023-12-10 DIAGNOSIS — M545 Low back pain, unspecified: Secondary | ICD-10-CM | POA: Diagnosis not present

## 2023-12-17 DIAGNOSIS — M545 Low back pain, unspecified: Secondary | ICD-10-CM | POA: Diagnosis not present

## 2023-12-22 ENCOUNTER — Telehealth: Payer: Self-pay

## 2023-12-22 NOTE — Telephone Encounter (Signed)
Prolia VOB initiated via [email or web address]  Next Prolia inj DUE: new start

## 2023-12-22 NOTE — Telephone Encounter (Signed)
 Pt ready for scheduling for PROLIA  on or after : 12/22/23   Option# 1: Buy/Bill (Office supplied medication)   Out-of-pocket cost due at time of clinic visit: $357   Number of injection/visits approved: 2   Primary: [employer] Prolia  co-insurance: 20% Admin fee co-insurance: 20%   Secondary: --- Prolia  co-insurance:  Admin fee co-insurance:    Medical Benefit Details: Date Benefits were checked: 07/09/23 Deductible: NO/ Coinsurance: 20%/ Admin Fee: 20%   Prior Auth: APPROVED PA# [id number] Expiration Date: 07/15/23-05/19/24  # of doses approved:2 ----------------------------------------------------------------------- Option# 2- Med Obtained from pharmacy:   Pharmacy benefit: Copay $975.16 (Paid to pharmacy) Admin Fee: 20% (Pay at clinic)   Prior Auth: N/A PA# Expiration Date:   # of doses approved:     If patient wants fill through the pharmacy benefit please send prescription to: [employer], and include estimated need by date in rx notes. Pharmacy will ship medication directly to the office.   Patient NOT eligible for Prolia  Copay Card. Copay Card can make patient's cost as little as $25. Link to apply: [email or web address]://[email or web address].[email or web address]/copay   ** This summary of benefits is an estimation of the patient's out-of-pocket cost. Exact cost may very based on individual plan coverage.

## 2023-12-24 DIAGNOSIS — M545 Low back pain, unspecified: Secondary | ICD-10-CM | POA: Diagnosis not present

## 2023-12-31 DIAGNOSIS — M545 Low back pain, unspecified: Secondary | ICD-10-CM | POA: Diagnosis not present

## 2023-12-31 DIAGNOSIS — M9901 Segmental and somatic dysfunction of cervical region: Secondary | ICD-10-CM | POA: Diagnosis not present

## 2023-12-31 DIAGNOSIS — M25552 Pain in left hip: Secondary | ICD-10-CM | POA: Diagnosis not present

## 2023-12-31 DIAGNOSIS — M9902 Segmental and somatic dysfunction of thoracic region: Secondary | ICD-10-CM | POA: Diagnosis not present

## 2023-12-31 DIAGNOSIS — M9904 Segmental and somatic dysfunction of sacral region: Secondary | ICD-10-CM | POA: Diagnosis not present

## 2024-01-01 NOTE — Telephone Encounter (Signed)
 Pt has declined scheduling for Prolia .

## 2024-01-06 ENCOUNTER — Ambulatory Visit (HOSPITAL_BASED_OUTPATIENT_CLINIC_OR_DEPARTMENT_OTHER)

## 2024-01-06 ENCOUNTER — Ambulatory Visit (INDEPENDENT_AMBULATORY_CARE_PROVIDER_SITE_OTHER): Admitting: Physician Assistant

## 2024-01-06 ENCOUNTER — Encounter (HOSPITAL_BASED_OUTPATIENT_CLINIC_OR_DEPARTMENT_OTHER): Payer: Self-pay | Admitting: Physician Assistant

## 2024-01-06 ENCOUNTER — Encounter: Payer: Self-pay | Admitting: Internal Medicine

## 2024-01-06 DIAGNOSIS — S42255A Nondisplaced fracture of greater tuberosity of left humerus, initial encounter for closed fracture: Secondary | ICD-10-CM

## 2024-01-06 DIAGNOSIS — M25512 Pain in left shoulder: Secondary | ICD-10-CM | POA: Diagnosis not present

## 2024-01-06 NOTE — Progress Notes (Signed)
 Office Visit Note   Patient: Crystal Bauer           Date of Birth: 29-Aug-1955           MRN: [id number] Visit Date: 01/06/2024              Requested by: Wendolyn Jenkins Jansky, MD [address] [place],  [place] [address] PCP: Wendolyn Jenkins Jansky, MD   Assessment & Plan: Visit Diagnoses:  1. Closed nondisplaced fracture of greater tuberosity of left humerus, initial encounter     Plan: 68 year old female presents 6 months status post left shoulder closed nondisplaced fracture of greater tuberosity of left humerus. Patient with two week history of gradually worsening left shoulder pain with weighted flexion and abduction during physical therapy. Physical therapist requesting repeat xray, concern fracture not fully healed prior to starting additional treatment/exercises. Repeat xray today reveals well healed fracture with continued soft tissue calcification at the rotator cuff insertion. Suspect increased weight may have caused/exacerbated rotator cuff tendinopathy. Offered short course of oral prednisone . Patient declined at this time. Will rest, continue ROM exercises but no weight, use ice/heat, and topical Voltaren. Patient will return in 3-4 weeks if no improvement. At that time, may discuss joint injection, course of prednisone , and/or need for repeat MRI for further evaluation.  Follow-Up Instructions: No follow-ups on file.   Orders:  Orders Placed This Encounter  Procedures   DG Shoulder Left   No orders of the defined types were placed in this encounter.     Procedures: No procedures performed   Clinical Data: No additional findings.   Subjective: Chief Complaint  Patient presents with   Left Shoulder - Pain    HPI  68 year old female presents six months status post left shoulder injury. Originally seen by Leonce RIGGERS on 07/08/23. Xray revealed small nondisplaced fracture of the greater tuberosity and possible chronic calcifications. Suspected rotator cuff  injury. An MRI was ordered. MRI revealed rotator cuff tendons were intact (Impression: Nondisplaced acute or early subacute fracture of the greater tuberosity of the humerus. Supraspinatus and infraspinatus tendinopathy without tear. Mild to moderate acromioclavicular osteoarthritis. Small volume of fluid in the subacromial/subdeltoid fluid consistent with bursitis, possible posttraumatic.). Patient followed-up with Leonce RIGGERS on 08/01/23 and reported significant improvement; continued pain and difficulty with ROM. Patient then returned again on 09/03/23 with continued improvement.  Patient presents today with two week left shoulder pain and limited ROM with flexion. Began gradually when started using 3lbs weight with physical therapy. Reports anterior shoulder discomfort. Only present with flexion and abduction. Has been using ice and Voltaren gel with mild improvement. Having red light, TENs, and cupping at physical therapy. Physical therapist concerned that avulsion fracture may be impeding shoulder movement. Wanted a shoulder xray prior to starting dry needling. Patient denies radiating pain, weakness, numbness/tingling.   Patient is right hand dominant. 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 Patient's interests involve taking care of her poodle and going to lunches with friends. She lives with her sister.  Review of Systems  All other systems reviewed and are negative.    Objective: Vital Signs: There were no vitals taken for this visit.  Physical Exam Constitutional:      Appearance: Normal appearance.  Pulmonary:     Effort: Pulmonary effort is normal.  Neurological:     Mental Status: She is alert.  Psychiatric:        Mood and  Affect: Mood normal.        Behavior: Behavior normal.     Ortho Exam  Left shoulder normal to inspection. No erythema, ecchymosis, or edema. Mild tenderness with palpation over anterior glenohumeral joint spacing.  No AC joint tenderness. No pain with cross body adduction. Limited ROM; passively can flex and abduct 95-100 degrees. Active ROM limited 60 degrees flexion and 80 degrees abduction. Patient will start utilizing compensatory scapular movement for active flexion and abduction. Minimal pain with flexion and abduction at shoulder against resistance. No pain with internal or external rotation. 5/5 motor strength. No pain with elbow testing. Sensation intact. Brisk capillary refill. Strong radial pulse.  Specialty Comments:  No specialty comments available.  Imaging: Left shoulder xray performed in office today reveals unchanged alignment of the lateral humeral head fracture with healing. Continued soft tissue calcification at the rotator cuff insertion.   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   Multinodular goiter 02/26/2013   Past Medical History:  Diagnosis Date   Arthritis    Atrial fibrillation (HCC) 05/20/1998   from stress/increased caffeine use, resolved   Cancer (HCC)    Cataract    Dysrhythmia    GERD (gastroesophageal reflux disease)    Hearing loss 05/20/2010   left ear   History of chicken pox    History of endometrial cancer    Hyperlipidemia    Hyperplastic colon polyp    Macroprolactinemia    Orthostatic hypotension    Palpitation    Pituitary adenoma (HCC)    micro   Pneumonia     Family History  Problem Relation Age of Onset   Colon cancer Mother 74   Stroke Mother 28       10/18/2017    Hyperlipidemia Mother    Breast cancer Mother 73 1 98   Cancer Mother    Early death Father    Arthritis Sister    Diabetes Brother        Type 2 Diabetes   Thyroid  cancer Maternal Aunt    Esophageal cancer Maternal Uncle    Diabetes Maternal Grandmother    Hearing loss Maternal Grandmother    Uterine cancer Maternal Grandmother 46 - 79   Stomach cancer Neg Hx    Rectal cancer Neg Hx     Past Surgical History:  Procedure Laterality Date   ABDOMINAL HYSTERECTOMY     CATARACT EXTRACTION     ROBOTIC ASSISTED TOTAL HYSTERECTOMY WITH BILATERAL SALPINGO OOPHERECTOMY Bilateral 02/26/2023   Procedure: XI ROBOTIC ASSISTED TOTAL HYSTERECTOMY WITH BILATERAL SALPINGO OOPHORECTOMY;  Surgeon: Viktoria Comer SAUNDERS, MD;  Location: [facility];  Service: Gynecology;  Laterality: Bilateral;   SENTINEL NODE BIOPSY N/A 02/26/2023   Procedure: SENTINEL NODE BIOPSY;  Surgeon: Viktoria Comer SAUNDERS, MD;  Location: [facility];  Service: Gynecology;  Laterality: N/A;   TONSILLECTOMY AND ADENOIDECTOMY  1965   Social History   Occupational History   Occupation: Teacher, 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  Tobacco Use   Smoking status: Former  Types: E-cigarettes   Smokeless tobacco: Never  Vaping Use   Vaping status: Never Used  Substance and Sexual Activity   Alcohol use: No   Drug use: No   Sexual activity: Not Currently    Birth control/protection: Post-menopausal

## 2024-01-07 DIAGNOSIS — M545 Low back pain, unspecified: Secondary | ICD-10-CM | POA: Diagnosis not present

## 2024-01-13 ENCOUNTER — Ambulatory Visit (HOSPITAL_BASED_OUTPATIENT_CLINIC_OR_DEPARTMENT_OTHER): Admitting: Physician Assistant

## 2024-01-13 ENCOUNTER — Encounter: Payer: Self-pay | Admitting: Internal Medicine

## 2024-01-13 ENCOUNTER — Ambulatory Visit (HOSPITAL_BASED_OUTPATIENT_CLINIC_OR_DEPARTMENT_OTHER): Admitting: Student

## 2024-01-13 ENCOUNTER — Ambulatory Visit (INDEPENDENT_AMBULATORY_CARE_PROVIDER_SITE_OTHER): Payer: Medicare PPO | Admitting: Internal Medicine

## 2024-01-13 VITALS — BP 110/60 | HR 65 | Ht 67.5 in | Wt 208.2 lb

## 2024-01-13 DIAGNOSIS — E042 Nontoxic multinodular goiter: Secondary | ICD-10-CM | POA: Diagnosis not present

## 2024-01-13 DIAGNOSIS — E538 Deficiency of other specified B group vitamins: Secondary | ICD-10-CM

## 2024-01-13 DIAGNOSIS — D352 Benign neoplasm of pituitary gland: Secondary | ICD-10-CM

## 2024-01-13 DIAGNOSIS — E559 Vitamin D deficiency, unspecified: Secondary | ICD-10-CM

## 2024-01-13 LAB — TSH: TSH: 0.86 m[IU]/L (ref 0.40–4.50)

## 2024-01-13 LAB — VITAMIN B12: Vitamin B-12: 390 pg/mL (ref 200–1100)

## 2024-01-13 LAB — PROLACTIN: Prolactin: 97.5 ng/mL — ABNORMAL HIGH

## 2024-01-13 NOTE — Progress Notes (Signed)
 Patient ID: Crystal Bauer, female   DOB: 05-21-1955, 68 y.o.   MRN: [id number]  HPI  Crystal Bauer is a 68 y.o.-year-old female, returning for f/u for MNG, microprolactinoma, vitamin B12, and D insufficiencies. Last visit 1 year ago.  Interim history: Patient started to feel better after stopping Cabergoline  in 2021.  She had weight loss, her dizziness improved.  At today's visit, she does not report breast discharge or fulness, HAs, vision pbs.   No headaches, blurry vision, no breast tenderness. Since last visit, she was diagnosed with endometrial cancer and she is now status post hysterectomy on 02/26/2023.  The cancer was encapsulated and was resected in toto.  No need for chemotherapy. Also, she had a closed nondisplaced humeral fracture, now healed.  She still has rotator cuff symptoms, though.  She is in physical therapy.  Multinodular goiter: Reviewed history: Patient complained of an enlarging left lower neck mass which was palpated at a visit with her PCP.  Thyroid  U/S on 02/04/2013 showed bilateral thyroid  nodules, small, less than 5 mm in the right lobe, and 3 larger nodules in the left lobe. The largest lesion is a 1.7 x 1.0 x 1.3 cm cyst situated in the left upper lobe, with post-acoustic shadowing, and colloid particles on the periphery.  Reviewed her TFTs: Lab Results  Component Value Date   TSH 0.97 01/13/2023   TSH 1.090 01/07/2020   TSH 0.92 01/07/2019   TSH 1.42 01/05/2018   TSH 1.530 11/17/2017   TSH 0.92 12/26/2016   TSH 0.78 12/23/2015   TSH 0.63 03/06/2015   TSH 1.10 12/02/2014   TSH 0.39 03/04/2014   FREET4 0.88 01/05/2018   FREET4 0.78 03/06/2015   FREET4 0.87 03/04/2014   FREET4 0.85 02/26/2013    Pt denies: - feeling nodules in neck - hoarseness - dysphagia - choking  Micro-prolactinoma: - dx in 1992 - She was initially on Cabergoline  >> stopped in 2000 as her microprolactinoma shrank. - PRL in ~2004 minimally elevated (Off  Cabergoline ). - We tried to stay off Cabergoline  in 2016, but she started to have L breast milky discharge + breast pain (lump). We checked PRL  >> increased to 122 >> started back on Cabergoline  0.25 mg bid >> levels improved >> decreased to once weekly in 06/2015 due to dizziness and palpitations.   - However, afterwards, we tried again to increase it to twice a week and she did well on this dose, until 2021 when she again started to have palpitations and had an episode of abdominal pain.  She decreased the dose of Cabergoline  to 0.25 mg weekly s in 10/2019. -In 2021, we stopped Cabergoline  completely  -She declined restarting Cabergoline  in 2022  Reviewed prolactin levels: Lab Results  Component Value Date   PROLACTIN 84.3 (H) 01/13/2023   PROLACTIN 75.6 (H) 01/11/2022   PROLACTIN 112.3 (H) 01/11/2021   PROLACTIN 55.3 (H) 01/07/2020   PROLACTIN 33.2 (H) 01/07/2019   PROLACTIN 35.7 (H) 01/05/2018   PROLACTIN 38.5 (H) 12/26/2016   PROLACTIN 24.1 12/27/2015   PROLACTIN 13.4 07/07/2015   PROLACTIN 57.1 (H) 03/06/2015   PROLACTIN 122.2 12/02/2014   PROLACTIN 99.8 03/04/2014   PROLACTIN 76.0 02/26/2013   Other pituitary labs were normal: Somatomedin (IGF-I)     31 - 179 ng/mL 119  C206 ACTH      10 - 46 pg/mL 21  Cortisol, Plasma      12.6  FSH      83.9  LH  35.53   MRI (03/09/2014): Pituitary microadenoma measuring 6 mm.  Reviewed vitamin D  levels: Lab Results  Component Value Date   VD25OH 32.32 07/08/2023   VD25OH 31.35 01/11/2022   VD25OH 31.9 01/11/2021   VD25OH 28.4 (L) 01/07/2020   VD25OH 27.35 (L) 01/07/2019   VD25OH 31.67 01/05/2018   VD25OH 36.12 12/26/2016   VD25OH 39.05 02/26/2016   VD25OH 27.08 (L) 12/27/2015   She continues on vitamin D  2000 units daily.   Reviewed vitamin B12 levels: Lab Results  Component Value Date   VITAMINB12 896 01/11/2022   VITAMINB12 831 01/11/2021   VITAMINB12 645 01/07/2020   VITAMINB12 380 01/07/2019   VITAMINB12  881 01/05/2018   VITAMINB12 >1500 (H) 12/26/2016   VITAMINB12 573 02/26/2016   VITAMINB12 254 12/27/2015   She continues on 1000 mcg sl  B12 daily.  She has HL. She did not feel well well on Pravastatin >> was on Red Yeast Rice - stopped. She has a h/o GERD with esophagitis. Also hiatal hernia.  ROS: + See HPI I reviewed pt's medications, allergies, PMH, social hx, family hx, and changes were documented in the history of present illness. Otherwise, unchanged from my initial visit note.  Past Medical History:  Diagnosis Date   Arthritis    Atrial fibrillation (HCC) 05/20/1998   from stress/increased caffeine use, resolved   Cancer (HCC)    Cataract    Dysrhythmia    GERD (gastroesophageal reflux disease)    Hearing loss 05/20/2010   left ear   History of chicken pox    History of endometrial cancer    Hyperlipidemia    Hyperplastic colon polyp    Macroprolactinemia    Orthostatic hypotension    Palpitation    Pituitary adenoma (HCC)    micro   Pneumonia    Past Surgical History:  Procedure Laterality Date   ABDOMINAL HYSTERECTOMY     CATARACT EXTRACTION     ROBOTIC ASSISTED TOTAL HYSTERECTOMY WITH BILATERAL SALPINGO OOPHERECTOMY Bilateral 02/26/2023   Procedure: XI ROBOTIC ASSISTED TOTAL HYSTERECTOMY WITH BILATERAL SALPINGO OOPHORECTOMY;  Surgeon: Viktoria Comer SAUNDERS, MD;  Location: [facility];  Service: Gynecology;  Laterality: Bilateral;   SENTINEL NODE BIOPSY N/A 02/26/2023   Procedure: SENTINEL NODE BIOPSY;  Surgeon: Viktoria Comer SAUNDERS, MD;  Location: [facility];  Service: Gynecology;  Laterality: N/A;   TONSILLECTOMY AND ADENOIDECTOMY  1965   Social History   Socioeconomic History   Marital status: Divorced    Spouse name: Not on file   Number of children: 3   Years of education: 12+   Highest education level: Bachelor's degree (e.g., BA, AB, BS)  Occupational History   Occupation: Teacher, 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  Tobacco Use    Smoking status: Former    Types: E-cigarettes   Smokeless tobacco: Never  Vaping Use   Vaping status: Never Used  Substance and Sexual Activity   Alcohol use: No   Drug use: No   Sexual activity: Not Currently    Birth control/protection: Post-menopausal  Other Topics Concern   Not on file  Social History Narrative   Regular exercise-no Caffeine Use-yes      6 grands   retired   Social Drivers of [employer] Strain: Low Risk  (07/04/2023)   Overall Financial Resource Strain (CARDIA)    Difficulty of Paying Living Expenses: Not hard at all  Food Insecurity: No Food Insecurity (07/04/2023)   Hunger Vital Sign  Worried About Programme researcher, broadcasting/film/video in the Last Year: Never true    Ran Out of Food in the Last Year: Never true  Transportation Needs: No Transportation Needs (07/04/2023)   PRAPARE - Administrator, Civil Service (Medical): No    Lack of Transportation (Non-Medical): No  Physical Activity: Insufficiently Active (07/04/2023)   Exercise Vital Sign    Days of Exercise per Week: 3 days    Minutes of Exercise per Session: 20 min  Stress: No Stress Concern Present (07/04/2023)   [name] of Occupational Health - Occupational Stress Questionnaire    Feeling of Stress : Not at all  Social Connections: Socially Isolated (07/04/2023)   Social Connection and Isolation Panel    Frequency of Communication with Friends and Family: Once a week    Frequency of Social Gatherings with Friends and Family: More than three times a week    Attends Religious Services: Never    Database administrator or Organizations: No    Attends Engineer, structural: Not on file    Marital Status: Divorced  Intimate Partner Violence: Not At Risk (04/14/2023)   Humiliation, Afraid, Rape, and Kick questionnaire    Fear of Current or Ex-Partner: No    Emotionally Abused: No    Physically Abused: No    Sexually Abused: No   Current Outpatient Medications on  File Prior to Visit  Medication Sig Dispense Refill   Cholecalciferol 25 MCG (1000 UT) capsule Take 1,000 Units by mouth daily.     Cyanocobalamin  (B-12) 1000 MCG CAPS Take 1,000 Units by mouth daily.     estradiol  (ESTRACE  VAGINAL) 0.1 MG/GM vaginal cream Use this 3 times a week at night.  Please do not use the applicator initially given recent surgery.  Instead, place a pea size amount of the vaginal estrogen on your finger and insert this into the bottom part of the vagina before you go to sleep. 42.5 g 3   Magnesium 400 MG CAPS Take 400 mg by mouth daily.     pantoprazole  (PROTONIX ) 40 MG tablet Take 1 tablet (40 mg total) by mouth 2 (two) times daily. TAKE 1 TABLET BY MOUTH 2 TIMES A DAY BEFORE A MEAL 180 tablet 3   Current Facility-Administered Medications on File Prior to Visit  Medication Dose Route Frequency Provider Last Rate Last Admin   denosumab  (PROLIA ) injection 60 mg  60 mg Subcutaneous Once Wendolyn Jenkins Jansky, MD       Allergies  Allergen Reactions   Erythromycin Shortness Of Breath and Anaphylaxis   Procaine Palpitations    Chest Pain    Quinolones Other (See Comments)    Insomnia Stay awake all night   Neosporin [Bacitracin-Polymyxin B] Dermatitis   Family History  Problem Relation Age of Onset   Colon cancer Mother 33   Stroke Mother 5       10/18/2017   Hyperlipidemia Mother    Breast cancer Mother 19 47 74   Cancer Mother    Early death Father    Arthritis Sister    Diabetes Brother        Type 2 Diabetes   Thyroid  cancer Maternal Aunt    Esophageal cancer Maternal Uncle    Diabetes Maternal Grandmother    Hearing loss Maternal Grandmother    Uterine cancer Maternal Grandmother 52 - 79   Stomach cancer Neg Hx    Rectal cancer Neg Hx    PE: BP 110/60   Pulse  65   Ht 5' 7.5 (1.715 m)   Wt 208 lb 3.2 oz (94.4 kg)   SpO2 96%   BMI 32.13 kg/m  Wt Readings from Last 15 Encounters:  01/13/24 208 lb 3.2 oz (94.4 kg)  11/04/23 207 lb 4 oz (94 kg)   10/02/23 204 lb 3.2 oz (92.6 kg)  07/08/23 205 lb (93 kg)  06/16/23 204 lb 8 oz (92.8 kg)  06/05/23 195 lb (88.5 kg)  04/14/23 200 lb (90.7 kg)  03/21/23 200 lb 6.4 oz (90.9 kg)  02/26/23 199 lb 2 oz (90.3 kg)  02/21/23 199 lb 2 oz (90.3 kg)  02/19/23 196 lb (88.9 kg)  02/13/23 197 lb 12.8 oz (89.7 kg)  01/13/23 199 lb 9.6 oz (90.5 kg)  08/20/22 194 lb (88 kg)  06/25/22 194 lb (88 kg)    Constitutional: overweight, in NAD Eyes: no exophthalmos ENT: no thyromegaly but palpable thyroid  nodule in left lobe, no cervical lymphadenopathy Cardiovascular: RRR, No RG, +1/6 SEM Respiratory: CTA B Musculoskeletal: no deformities Skin:  no rashes Neurological: no tremor with outstretched hands  ASSESSMENT: 1. MNG - thyroid  U/S 02/04/2013: Right thyroid  lobe - 4.2 x 1.5 x 1.1 cm. Only small right thyroid  nodules are present of no more than 5 mm in diameter.  Left thyroid  lobe - 4.9 x 2.1 x 1.9 cm. Multiple left thyroid  nodules are present. The largest is complex but primarily cystic in the upper pole of the left lobe measuring 1.7 x 1.0 x 1.3 cm. Peripheral calcifications are noted within this cystic lesion. (these do appear to be not calcifications but inspissated colloid due to presence of comet tail artifact). Nodules in the lower pole of the left lobe measure 1.2 x 0.7 x 1.1 cm, and 1.0 x 0.8 x 1.8 cm. One of them is a cyst. Isthmus  -  Thickness: 3 mm in thickness. No nodules visualized.  Lymphadenopathy  - None visualized.  2. History of microprolactinoma - dx 1992 - previously on cabergoline  until 2000, when she took herself off it due to palpitations.  We restarted these afterwards and she is currently on 25 mcg twice a week - prolactin check in ~2004 was mildly elevated - has 3 children - she is now postmenopausal - no osteoporosis - MRI (03/09/2014): 1. Findings suspicious for a 6 mm focus of hypo enhancing soft  tissue arising somewhat exophyticly from the caudal aspect of  the  gland, suspicious for a microadenoma in this setting. 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  2. Otherwise normal pituitary imaging.  3. Otherwise normal for age MRI appearance of the brain.   3. Vit D insuff  4. Low Vit B12   PLAN: 1. MNG  - Reviewed the results of her latest thyroid  ultrasound: Left-sided thyroid  cyst with coagulated colloid. - She has no neck compression symptoms - latest TSH was normal: Lab Results  Component Value Date   TSH 0.97 01/13/2023  - No further imaging is needed but we will continue to follow her clinically and we will recheck her TSH today  2. Microprolactinoma - She has a history of a 6 mm micro prolactinoma with normal pituitary hormones except for likely high prolactin -In the past, she could not tolerate twice a week Cabergoline  because of dizziness and palpitations, however, afterwards, she started to do well with 0.25 mg dose twice a week until right before last visit when she had palpitations and an episode of abdominal  pain.  She decreased the dose by herself to 0.25 mg weekly but then stopped it completely, by herself -Her prolactin levels were increasing before, up to 112.  At that time, we discussed about possibly adding back Cabergoline , but she declined.  She felt that after stopping Cabergoline , her dizziness resolved and she was also able to lose some weight.  Since she is postmenopausal, it was not mandatory to restart Cabergoline  but we discussed about doing so if she started to get breast tenderness or headaches.  Since she does not have any of these, we are following her conservatively. - At last visit, prolactin level was only slightly higher, at 84.3 - At today's visit, we discussed about observational studies that show that the higher prolactin level may be connected with endometrial cancer.  The data are not very strong, but we discussed about this potential connection.   However, with her cancer resected in total, she mentions that she would not want to start Cabergoline  at the moment - We will recheck a prolactin level today.  If increasing, we may need a pituitary MRI  3. Vit D insuff - No muscle or joint aches - Latest vitamin D  levels were normal: Lab Results  Component Value Date   VD25OH 32.32 07/08/2023   VD25OH 31.35 01/11/2022  - She is currently on 2000 units vitamin D  daily  4. Low Vit B12  - No fatigue, numbness, disequilibrium - Continues 1000 mcg sublingual B12 -Latest B12 levels were normal: Lab Results  Component Value Date   VITAMINB12 896 01/11/2022   VITAMINB12 831 01/11/2021  - She declined another check at last visit - Will recheck this today  Orders Placed This Encounter  Procedures   TSH   Vitamin B12   Prolactin   Dorena Dorfman, MD PhD [facility]

## 2024-01-13 NOTE — Patient Instructions (Signed)
  Please stop at the lab.  Please continue off Cabergoline.  Continue vitamin D 2000 IU daily and vitamin B12 1000 mcg daily.  Please return in 1 year.

## 2024-01-14 ENCOUNTER — Ambulatory Visit: Payer: Self-pay | Admitting: Internal Medicine

## 2024-01-14 DIAGNOSIS — M545 Low back pain, unspecified: Secondary | ICD-10-CM | POA: Diagnosis not present

## 2024-01-15 ENCOUNTER — Ambulatory Visit: Admitting: Internal Medicine

## 2024-01-15 ENCOUNTER — Encounter: Payer: Self-pay | Admitting: Internal Medicine

## 2024-01-15 VITALS — BP 110/71 | HR 60 | Temp 97.0°F | Resp 12 | Ht 67.5 in | Wt 207.0 lb

## 2024-01-15 DIAGNOSIS — Z1211 Encounter for screening for malignant neoplasm of colon: Secondary | ICD-10-CM | POA: Diagnosis not present

## 2024-01-15 DIAGNOSIS — D123 Benign neoplasm of transverse colon: Secondary | ICD-10-CM | POA: Diagnosis not present

## 2024-01-15 DIAGNOSIS — K5730 Diverticulosis of large intestine without perforation or abscess without bleeding: Secondary | ICD-10-CM | POA: Diagnosis not present

## 2024-01-15 DIAGNOSIS — D122 Benign neoplasm of ascending colon: Secondary | ICD-10-CM | POA: Diagnosis not present

## 2024-01-15 DIAGNOSIS — I4891 Unspecified atrial fibrillation: Secondary | ICD-10-CM | POA: Diagnosis not present

## 2024-01-15 MED ORDER — SODIUM CHLORIDE 0.9 % IV SOLN: 500.0000 mL | INTRAVENOUS | Status: DC

## 2024-01-15 NOTE — Progress Notes (Signed)
 Sedate, gd SR, tolerated procedure well, VSS, report to RN

## 2024-01-15 NOTE — Patient Instructions (Signed)
 Resume previous diet  Continue present medications await pathology results  Repeat colonoscopy recommended. Colonoscopy date will be determined after pathology results are available.  See handouts for diverticulosis and polyps  YOU HAD AN ENDOSCOPIC PROCEDURE TODAY AT THE [facility]:   Refer to the procedure report that was given to you for any specific questions about what was found during the examination.  If the procedure report does not answer your questions, please call your gastroenterologist to clarify.  If you requested that your care partner not be given the details of your procedure findings, then the procedure report has been included in a sealed envelope for you to review at your convenience later.  YOU SHOULD EXPECT: Some feelings of bloating in the abdomen. Passage of more gas than usual.  Walking can help get rid of the air that was put into your GI tract during the procedure and reduce the bloating. If you had a lower endoscopy (such as a colonoscopy or flexible sigmoidoscopy) you may notice spotting of blood in your stool or on the toilet paper. If you underwent a bowel prep for your procedure, you may not have a normal bowel movement for a few days.  Please Note:  You might notice some irritation and congestion in your nose or some drainage.  This is from the oxygen used during your procedure.  There is no need for concern and it should clear up in a day or so.  SYMPTOMS TO REPORT IMMEDIATELY: Following lower endoscopy (colonoscopy or flexible sigmoidoscopy):  Excessive amounts of blood in the stool  Significant tenderness or worsening of abdominal pains  Swelling of the abdomen that is new, acute  Fever of 100F or higher  For urgent or emergent issues, a gastroenterologist can be reached at any hour by calling [phone]. Do not use MyChart messaging for urgent concerns.   DIET:  We do recommend a small meal at first, but then you may proceed to your regular  diet.  Drink plenty of fluids but you should avoid alcoholic beverages for 24 hours.  ACTIVITY:  You should plan to take it easy for the rest of today and you should NOT DRIVE or use heavy machinery until tomorrow (because of the sedation medicines used during the test).    FOLLOW UP: Our staff will call the number listed on your records the next business day following your procedure.  We will call around 7:15- 8:00 am to check on you and address any questions or concerns that you may have regarding the information given to you following your procedure. If we do not reach you, we will leave a message.     If any biopsies were taken you will be contacted by phone or by letter within the next 1-3 weeks.  Please call us  at [phone] if you have not heard about the biopsies in 3 weeks.   SIGNATURES/CONFIDENTIALITY: You and/or your care partner have signed paperwork which will be entered into your electronic medical record.  These signatures attest to the fact that that the information above on your After Visit Summary has been reviewed and is understood.  Full responsibility of the confidentiality of this discharge information lies with you and/or your care-partner.

## 2024-01-15 NOTE — Addendum Note (Signed)
 Addended by: TRIXIE FILE on: 01/15/2024 11:54 AM   Modules accepted: Orders

## 2024-01-15 NOTE — Op Note (Signed)
 [facility] Patient Name: Crystal Bauer Procedure Date: 01/15/2024 3:31 PM MRN: [id number] Endoscopist: Gordy CHRISTELLA Starch , MD, [id number] Age: 68 Referring MD:  Date of Birth: [birth year] Gender: Female Account #: [id number] Procedure:                Colonoscopy Indications:              Screening for colorectal malignant neoplasm, Last                            colonoscopy: 2014 Medicines:                Monitored Anesthesia Care Procedure:                Pre-Anesthesia Assessment:                           - Prior to the procedure, a History and Physical                            was performed, and patient medications and                            allergies were reviewed. The patient's tolerance of                            previous anesthesia was also reviewed. The risks                            and benefits of the procedure and the sedation                            options and risks were discussed with the patient.                            All questions were answered, and informed consent                            was obtained. Prior Anticoagulants: The patient has                            taken no anticoagulant or antiplatelet agents. ASA                            Grade Assessment: II - A patient with mild systemic                            disease. After reviewing the risks and benefits,                            the patient was deemed in satisfactory condition to                            undergo the procedure.  After obtaining informed consent, the colonoscope                            was passed under direct vision. Throughout the                            procedure, the patient's blood pressure, pulse, and                            oxygen saturations were monitored continuously. The                            [id number]-HQ190L Colonoscope [id number] was introduced                            through the anus and advanced to the  terminal                            ileum. The colonoscopy was performed without                            difficulty. The patient tolerated the procedure                            well. The quality of the bowel preparation was                            good. The terminal ileum, ileocecal valve,                            appendiceal orifice, and rectum were photographed. Scope In: 3:39:50 PM Scope Out: 3:55:11 PM Scope Withdrawal Time: 0 hours 13 minutes 1 second  Total Procedure Duration: 0 hours 15 minutes 21 seconds  Findings:                 The digital rectal exam was normal.                           A 6 mm polyp was found in the ascending colon. The                            polyp was sessile. The polyp was removed with a                            cold snare. Resection and retrieval were complete.                           A 3 mm polyp was found in the transverse colon. The                            polyp was sessile. The polyp was removed with a  cold snare. Resection and retrieval were complete.                           A few small-mouthed diverticula were found in the                            sigmoid colon and descending colon.                           The retroflexed view of the distal rectum and anal                            verge was normal and showed no anal or rectal                            abnormalities. Complications:            No immediate complications. Estimated Blood Loss:     Estimated blood loss: none. Impression:               - One 6 mm polyp in the ascending colon, removed                            with a cold snare. Resected and retrieved.                           - One 3 mm polyp in the transverse colon, removed                            with a cold snare. Resected and retrieved.                           - Mild diverticulosis in the sigmoid colon and in                            the descending colon.                            - The distal rectum and anal verge are normal on                            retroflexion view. Recommendation:           - Patient has a contact number available for                            emergencies. The signs and symptoms of potential                            delayed complications were discussed with the                            patient. Return to normal activities tomorrow.  Written discharge instructions were provided to the                            patient.                           - Resume previous diet.                           - Continue present medications.                           - Await pathology results.                           - Repeat colonoscopy is recommended. The                            colonoscopy date will be determined after pathology                            results from today's exam become available for                            review. Gordy CHRISTELLA Starch, MD 01/15/2024 3:58:07 PM This report has been signed electronically.

## 2024-01-15 NOTE — Progress Notes (Signed)
 GASTROENTEROLOGY PROCEDURE H&P NOTE   Primary Care Physician: Wendolyn Jenkins Jansky, MD    Reason for Procedure:  Colon cancer screening  Plan:    Colonoscopy  Patient is appropriate for endoscopic procedure(s) in the ambulatory ([facility]) setting.  The nature of the procedure, as well as the risks, benefits, and alternatives were carefully and thoroughly reviewed with the patient. Ample time for discussion and questions allowed. The patient understood, was satisfied, and agreed to proceed.     HPI: Crystal Bauer is a 68 y.o. female who presents for colonoscopy.  Medical history as below.  Tolerated the prep.  No recent chest pain or shortness of breath.  No abdominal pain today.  Past Medical History:  Diagnosis Date   Arthritis    Atrial fibrillation (HCC) 05/20/1998   from stress/increased caffeine use, resolved   Cancer (HCC)    Cataract    Dysrhythmia    GERD (gastroesophageal reflux disease)    Hearing loss 05/20/2010   left ear   History of chicken pox    History of endometrial cancer    Hyperlipidemia    Hyperplastic colon polyp    Macroprolactinemia    Orthostatic hypotension    Palpitation    Pituitary adenoma (HCC)    micro   Pneumonia     Past Surgical History:  Procedure Laterality Date   ABDOMINAL HYSTERECTOMY     CATARACT EXTRACTION     ROBOTIC ASSISTED TOTAL HYSTERECTOMY WITH BILATERAL SALPINGO OOPHERECTOMY Bilateral 02/26/2023   Procedure: XI ROBOTIC ASSISTED TOTAL HYSTERECTOMY WITH BILATERAL SALPINGO OOPHORECTOMY;  Surgeon: Viktoria Comer SAUNDERS, MD;  Location: [facility];  Service: Gynecology;  Laterality: Bilateral;   SENTINEL NODE BIOPSY N/A 02/26/2023   Procedure: SENTINEL NODE BIOPSY;  Surgeon: Viktoria Comer SAUNDERS, MD;  Location: [facility];  Service: Gynecology;  Laterality: N/A;   TONSILLECTOMY AND ADENOIDECTOMY  1965    Prior to Admission medications   Medication Sig Start Date End Date Taking? Authorizing Provider  Calcium Carbonate (CALCIUM  500 PO) Take by mouth.   Yes [provider]  Cholecalciferol 25 MCG (1000 UT) capsule Take 1,000 Units by mouth daily.   Yes [provider]  Cyanocobalamin  (B-12) 1000 MCG CAPS Take 1,000 Units by mouth daily.   Yes [provider]  estradiol  (ESTRACE  VAGINAL) 0.1 MG/GM vaginal cream Use this 3 times a week at night.  Please do not use the applicator initially given recent surgery.  Instead, place a pea size amount of the vaginal estrogen on your finger and insert this into the bottom part of the vagina before you go to sleep. 03/24/23  Yes Cross, Melissa D, NP  Magnesium 400 MG CAPS Take 400 mg by mouth daily.   Yes [provider]  pantoprazole  (PROTONIX ) 40 MG tablet Take 1 tablet (40 mg total) by mouth 2 (two) times daily. TAKE 1 TABLET BY MOUTH 2 TIMES A DAY BEFORE A MEAL 11/04/23 10/29/24 Yes [place], PA-C    Current Outpatient Medications  Medication Sig Dispense Refill   Calcium Carbonate (CALCIUM 500 PO) Take by mouth.     Cholecalciferol 25 MCG (1000 UT) capsule Take 1,000 Units by mouth daily.     Cyanocobalamin  (B-12) 1000 MCG CAPS Take 1,000 Units by mouth daily.     estradiol  (ESTRACE  VAGINAL) 0.1 MG/GM vaginal cream Use this 3 times a week at night.  Please do not use the applicator initially given recent surgery.  Instead, place a pea size amount of the vaginal  estrogen on your finger and insert this into the bottom part of the vagina before you go to sleep. 42.5 g 3   Magnesium 400 MG CAPS Take 400 mg by mouth daily.     pantoprazole  (PROTONIX ) 40 MG tablet Take 1 tablet (40 mg total) by mouth 2 (two) times daily. TAKE 1 TABLET BY MOUTH 2 TIMES A DAY BEFORE A MEAL 180 tablet 3   Current Facility-Administered Medications  Medication Dose Route Frequency Provider Last Rate Last Admin   0.9 %  sodium chloride  infusion  500 mL Intravenous Continuous Elora Wolter, Gordy HERO, MD       denosumab  (PROLIA ) injection 60 mg  60 mg Subcutaneous Once Kulik, Ann  Marie, MD        Allergies as of 01/15/2024 - Review Complete 01/15/2024  Allergen Reaction Noted   Erythromycin Shortness Of Breath and Anaphylaxis 07/23/2011   Procaine Palpitations 02/21/2021   Quinolones Other (See Comments) 07/23/2011   Neosporin [bacitracin-polymyxin b] Dermatitis 02/13/2023    Family History  Problem Relation Age of Onset   Colon cancer Mother 38   Stroke Mother 32       10/18/2017   Hyperlipidemia Mother    Breast cancer Mother 42 - 76   Cancer Mother    Early death Father    Arthritis Sister    Diabetes Brother        Type 2 Diabetes   Thyroid  cancer Maternal Aunt    Esophageal cancer Maternal Uncle    Diabetes Maternal Grandmother    Hearing loss Maternal Grandmother    Uterine cancer Maternal Grandmother 68 - 79   Stomach cancer Neg Hx    Rectal cancer Neg Hx     Social History   Socioeconomic History   Marital status: Divorced    Spouse name: Not on file   Number of children: 3   Years of education: 12+   Highest education level: Bachelor's degree (e.g., BA, AB, BS)  Occupational History   Occupation: Teacher, 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  Tobacco Use   Smoking status: Former    Types: E-cigarettes   Smokeless tobacco: Never  Vaping Use   Vaping status: Never Used  Substance and Sexual Activity   Alcohol use: No   Drug use: No   Sexual activity: Not Currently    Birth control/protection: Post-menopausal  Other Topics Concern   Not on file  Social History Narrative   Regular exercise-no Caffeine Use-yes      6 grands   retired   Chief Executive Officer Drivers of Corporate investment banker Strain: Low Risk  (07/04/2023)   Overall Financial Resource Strain (CARDIA)    Difficulty of Paying Living Expenses: Not hard at all  Food Insecurity: No Food Insecurity (07/04/2023)   Hunger Vital Sign    Worried About Running Out of Food in the Last Year: Never true    Ran Out of Food in the Last Year: Never true   Transportation Needs: No Transportation Needs (07/04/2023)   PRAPARE - Administrator, Civil Service (Medical): No    Lack of Transportation (Non-Medical): No  Physical Activity: Insufficiently Active (07/04/2023)   Exercise Vital Sign    Days of Exercise per Week: 3 days    Minutes of Exercise per Session: 20 min  Stress: No Stress Concern Present (07/04/2023)   [name] of Occupational Health - Occupational Stress Questionnaire    Feeling of Stress : Not  at all  Social Connections: Socially Isolated (07/04/2023)   Social Connection and Isolation Panel    Frequency of Communication with Friends and Family: Once a week    Frequency of Social Gatherings with Friends and Family: More than three times a week    Attends Religious Services: Never    Database administrator or Organizations: No    Attends Engineer, structural: Not on file    Marital Status: Divorced  Intimate Partner Violence: Not At Risk (04/14/2023)   Humiliation, Afraid, Rape, and Kick questionnaire    Fear of Current or Ex-Partner: No    Emotionally Abused: No    Physically Abused: No    Sexually Abused: No    Physical Exam: Vital signs in last 24 hours: @BP  124/64   Pulse 80   Temp (!) 97 F (36.1 C) (Skin)   Ht 5' 7.5 (1.715 m)   Wt 207 lb (93.9 kg)   SpO2 96%   BMI 31.94 kg/m  GEN: NAD EYE: Sclerae anicteric ENT: MMM CV: Non-tachycardic Pulm: CTA b/l GI: Soft, NT/ND NEURO:  Alert & Oriented x 3   Gordy Starch, MD [facility]  01/15/2024 3:30 PM

## 2024-01-15 NOTE — Progress Notes (Signed)
 Called to room to assist during endoscopic procedure.  Patient ID and intended procedure confirmed with present staff. Received instructions for my participation in the procedure from the performing physician.

## 2024-01-16 ENCOUNTER — Telehealth: Payer: Self-pay | Admitting: *Deleted

## 2024-01-16 NOTE — Telephone Encounter (Signed)
  Follow up Call-     01/15/2024    2:26 PM 08/20/2022    2:08 PM 06/25/2022    1:07 PM 04/23/2022    2:40 PM  Call back number  Post procedure Call Back phone  # [phone]  Permission to leave phone message Yes Yes Yes Yes     Patient questions:  Do you have a fever, pain , or abdominal swelling? No. Pain Score  0 *  Have you tolerated food without any problems? Yes.    Have you been able to return to your normal activities? Yes.    Do you have any questions about your discharge instructions: Diet   No. Medications  No. Follow up visit  No.  Do you have questions or concerns about your Care? No.  Actions: * If pain score is 4 or above: No action needed, pain <4.

## 2024-01-21 ENCOUNTER — Ambulatory Visit: Payer: Self-pay | Admitting: Internal Medicine

## 2024-01-21 DIAGNOSIS — M545 Low back pain, unspecified: Secondary | ICD-10-CM | POA: Diagnosis not present

## 2024-01-21 LAB — SURGICAL PATHOLOGY

## 2024-01-28 DIAGNOSIS — M9901 Segmental and somatic dysfunction of cervical region: Secondary | ICD-10-CM | POA: Diagnosis not present

## 2024-01-28 DIAGNOSIS — M25552 Pain in left hip: Secondary | ICD-10-CM | POA: Diagnosis not present

## 2024-01-28 DIAGNOSIS — M9904 Segmental and somatic dysfunction of sacral region: Secondary | ICD-10-CM | POA: Diagnosis not present

## 2024-01-28 DIAGNOSIS — M9902 Segmental and somatic dysfunction of thoracic region: Secondary | ICD-10-CM | POA: Diagnosis not present

## 2024-02-04 DIAGNOSIS — M545 Low back pain, unspecified: Secondary | ICD-10-CM | POA: Diagnosis not present

## 2024-02-08 ENCOUNTER — Ambulatory Visit
Admission: RE | Admit: 2024-02-08 | Discharge: 2024-02-08 | Disposition: A | Discharge status: Home / Self Care | Source: Ambulatory Visit | Attending: Internal Medicine | Admitting: Internal Medicine

## 2024-02-08 DIAGNOSIS — D352 Benign neoplasm of pituitary gland: Secondary | ICD-10-CM

## 2024-02-08 DIAGNOSIS — D354 Benign neoplasm of pineal gland: Secondary | ICD-10-CM | POA: Diagnosis not present

## 2024-02-08 MED ORDER — GADOPICLENOL 0.5 MMOL/ML IV SOLN
9.0000 mL | Freq: Once | INTRAVENOUS | Status: AC | PRN
  Administered 2024-02-08: 9 mL via INTRAVENOUS

## 2024-02-18 DIAGNOSIS — M545 Low back pain, unspecified: Secondary | ICD-10-CM | POA: Diagnosis not present

## 2024-02-25 DIAGNOSIS — M9901 Segmental and somatic dysfunction of cervical region: Secondary | ICD-10-CM | POA: Diagnosis not present

## 2024-02-25 DIAGNOSIS — M545 Low back pain, unspecified: Secondary | ICD-10-CM | POA: Diagnosis not present

## 2024-02-25 DIAGNOSIS — M25552 Pain in left hip: Secondary | ICD-10-CM | POA: Diagnosis not present

## 2024-02-25 DIAGNOSIS — M9904 Segmental and somatic dysfunction of sacral region: Secondary | ICD-10-CM | POA: Diagnosis not present

## 2024-02-25 DIAGNOSIS — M9902 Segmental and somatic dysfunction of thoracic region: Secondary | ICD-10-CM | POA: Diagnosis not present

## 2024-03-09 DIAGNOSIS — M545 Low back pain, unspecified: Secondary | ICD-10-CM | POA: Diagnosis not present

## 2024-03-16 DIAGNOSIS — Z6832 Body mass index (BMI) 32.0-32.9, adult: Secondary | ICD-10-CM | POA: Diagnosis not present

## 2024-03-16 DIAGNOSIS — Z779 Other contact with and (suspected) exposures hazardous to health: Secondary | ICD-10-CM | POA: Diagnosis not present

## 2024-03-16 DIAGNOSIS — C55 Malignant neoplasm of uterus, part unspecified: Secondary | ICD-10-CM | POA: Diagnosis not present

## 2024-03-16 DIAGNOSIS — N898 Other specified noninflammatory disorders of vagina: Secondary | ICD-10-CM | POA: Diagnosis not present

## 2024-03-22 ENCOUNTER — Encounter: Payer: Self-pay | Admitting: Radiology

## 2024-03-24 DIAGNOSIS — M9901 Segmental and somatic dysfunction of cervical region: Secondary | ICD-10-CM | POA: Diagnosis not present

## 2024-03-24 DIAGNOSIS — M9904 Segmental and somatic dysfunction of sacral region: Secondary | ICD-10-CM | POA: Diagnosis not present

## 2024-03-24 DIAGNOSIS — M9903 Segmental and somatic dysfunction of lumbar region: Secondary | ICD-10-CM | POA: Diagnosis not present

## 2024-03-24 DIAGNOSIS — M25552 Pain in left hip: Secondary | ICD-10-CM | POA: Diagnosis not present

## 2024-03-24 DIAGNOSIS — M545 Low back pain, unspecified: Secondary | ICD-10-CM | POA: Diagnosis not present

## 2024-03-24 DIAGNOSIS — M9902 Segmental and somatic dysfunction of thoracic region: Secondary | ICD-10-CM | POA: Diagnosis not present

## 2024-04-09 DIAGNOSIS — M545 Low back pain, unspecified: Secondary | ICD-10-CM | POA: Diagnosis not present

## 2024-04-21 DIAGNOSIS — M9901 Segmental and somatic dysfunction of cervical region: Secondary | ICD-10-CM | POA: Diagnosis not present

## 2024-04-21 DIAGNOSIS — M9902 Segmental and somatic dysfunction of thoracic region: Secondary | ICD-10-CM | POA: Diagnosis not present

## 2024-04-21 DIAGNOSIS — M9904 Segmental and somatic dysfunction of sacral region: Secondary | ICD-10-CM | POA: Diagnosis not present

## 2024-04-21 DIAGNOSIS — M545 Low back pain, unspecified: Secondary | ICD-10-CM | POA: Diagnosis not present

## 2024-04-21 DIAGNOSIS — M25552 Pain in left hip: Secondary | ICD-10-CM | POA: Diagnosis not present

## 2024-04-21 DIAGNOSIS — M9903 Segmental and somatic dysfunction of lumbar region: Secondary | ICD-10-CM | POA: Diagnosis not present

## 2024-05-19 ENCOUNTER — Telehealth: Payer: Self-pay

## 2024-05-19 NOTE — Telephone Encounter (Signed)
 The office received a refill request from [name] for Estradiol  0.01% cream  Prescription authorized with 6 additional refills. Pt has a follow up appointment with Dr.Tucker May 2026

## 2024-06-08 ENCOUNTER — Ambulatory Visit

## 2024-06-08 VITALS — BP 118/68 | HR 63 | Temp 98.6°F | Ht 68.0 in | Wt 206.8 lb

## 2024-06-08 DIAGNOSIS — Z0000 Encounter for general adult medical examination without abnormal findings: Secondary | ICD-10-CM

## 2024-06-08 NOTE — Progress Notes (Addendum)
 "  Chief Complaint  Patient presents with   Medicare Wellness     Subjective:   TEISHA TROWBRIDGE is a 69 y.o. female who presents for a Medicare Annual Wellness Visit.  Visit info / Clinical Intake: Medicare Wellness Visit Type:: Subsequent Annual Wellness Visit Persons participating in visit and providing information:: patient Medicare Wellness Visit Mode:: In-person (required for WTM) Interpreter Needed?: No Pre-visit prep was completed: yes AWV questionnaire completed by patient prior to visit?: yes Date:: 06/01/24 Living arrangements:: (Patient-Rptd) with family/others Patient's Overall Health Status Rating: (Patient-Rptd) very good Typical amount of pain: (Patient-Rptd) some Does pain affect daily life?: (Patient-Rptd) no Are you currently prescribed opioids?: no  Dietary Habits and Nutritional Risks How many meals a day?: (Patient-Rptd) 2 Eats fruit and vegetables daily?: (Patient-Rptd) yes Most meals are obtained by: (Patient-Rptd) preparing own meals In the last 2 weeks, have you had any of the following?: none Diabetic:: no  Functional Status Activities of Daily Living (to include ambulation/medication): (Patient-Rptd) Independent Ambulation: Independent with device- listed below Home Assistive Devices/Equipment: Eyeglasses Medication Administration: (Patient-Rptd) Independent Home Management (perform basic housework or laundry): (Patient-Rptd) Independent Manage your own finances?: (Patient-Rptd) yes Primary transportation is: (Patient-Rptd) driving Concerns about vision?: no *vision screening is required for WTM* Concerns about hearing?: (!) yes Uses hearing aids?: (!) yes (loss hearing in left ear more in crowded rooms) Hear whispered voice?: yes  Fall Screening Falls in the past year?: (Patient-Rptd) 1 Number of falls in past year: (Patient-Rptd) 0 Was there an injury with Fall?: 1 (left shoulder) Fall Risk Category Calculator: (Patient-Rptd) 2 Patient  Fall Risk Level: (Patient-Rptd) Moderate Fall Risk  Fall Risk Patient at Risk for Falls Due to: History of fall(s) Fall risk Follow up: Falls evaluation completed  Home and Transportation Safety: All rugs have non-skid backing?: (Patient-Rptd) yes All stairs or steps have railings?: (Patient-Rptd) N/A, no stairs Grab bars in the bathtub or shower?: (Patient-Rptd) yes Have non-skid surface in bathtub or shower?: (Patient-Rptd) yes Good home lighting?: (Patient-Rptd) yes Regular seat belt use?: (Patient-Rptd) yes Hospital stays in the last year:: (Patient-Rptd) no  Cognitive Assessment Difficulty concentrating, remembering, or making decisions? : (Patient-Rptd) no Will 6CIT or Mini Cog be Completed: yes What year is it?: 0 points What month is it?: 0 points Give patient an address phrase to remember (5 components): [address] [place]  About what time is it?: 0 points Count backwards from 20 to 1: 0 points Say the months of the year in reverse: 0 points Repeat the address phrase from earlier: 2 points 6 CIT Score: 2 points  Advance Directives (For Healthcare) Does Patient Have a Medical Advance Directive?: Yes Does patient want to make changes to medical advance directive?: Yes (MAU/Ambulatory/Procedural Areas - Information given)  Reviewed/Updated  Reviewed/Updated: Reviewed All (Medical, Surgical, Family, Medications, Allergies, Care Teams, Patient Goals)    Allergies (verified) Erythromycin, Procaine, Quinolones, and Neosporin [bacitracin-polymyxin b]   Current Medications (verified) Outpatient Encounter Medications as of 06/08/2024  Medication Sig   Calcium Carbonate (CALCIUM 500 PO) Take by mouth.   calcium citrate (CALCITRATE - DOSED IN MG ELEMENTAL CALCIUM) 950 (200 Ca) MG tablet Take 200 mg of elemental calcium by mouth daily.   Cholecalciferol 25 MCG (1000 UT) capsule Take 1,000 Units by mouth daily.   Collagen-Vitamin C-Biotin (COLLAGEN PO) Take by mouth. Added  into smoothie with super greens, fiber,   Cyanocobalamin  (B-12) 1000 MCG CAPS Take 1,000 Units by mouth daily.   estradiol  (ESTRACE  VAGINAL) 0.1 MG/GM  vaginal cream Use this 3 times a week at night.  Please do not use the applicator initially given recent surgery.  Instead, place a pea size amount of the vaginal estrogen on your finger and insert this into the bottom part of the vagina before you go to sleep.   Magnesium 400 MG CAPS Take 400 mg by mouth daily.   Multiple Vitamins-Minerals (EYE VITAMINS PO) Take by mouth. Ocuvite   NUVAXOVID COVID-19 VACCINE 5 MCG/0.5ML SUSY    pantoprazole  (PROTONIX ) 40 MG tablet Take 1 tablet (40 mg total) by mouth 2 (two) times daily. TAKE 1 TABLET BY MOUTH 2 TIMES A DAY BEFORE A MEAL   COMIRNATY syringe    Facility-Administered Encounter Medications as of 06/08/2024  Medication   denosumab  (PROLIA ) injection 60 mg    History: Past Medical History:  Diagnosis Date   Allergy    Arthritis    Atrial fibrillation (HCC) 05/20/1998   from stress/increased caffeine use, resolved   Cancer (HCC)    Cataract    Dysrhythmia    GERD (gastroesophageal reflux disease)    Hearing loss 05/20/2010   left ear   History of chicken pox    History of endometrial cancer    Hyperlipidemia    Hyperplastic colon polyp    Macroprolactinemia    Orthostatic hypotension    Palpitation    Pituitary adenoma (HCC)    micro   Pneumonia    Past Surgical History:  Procedure Laterality Date   ABDOMINAL HYSTERECTOMY     CATARACT EXTRACTION     ROBOTIC ASSISTED TOTAL HYSTERECTOMY WITH BILATERAL SALPINGO OOPHERECTOMY Bilateral 02/26/2023   Procedure: XI ROBOTIC ASSISTED TOTAL HYSTERECTOMY WITH BILATERAL SALPINGO OOPHORECTOMY;  Surgeon: Viktoria Comer SAUNDERS, MD;  Location: [facility];  Service: Gynecology;  Laterality: Bilateral;   SENTINEL NODE BIOPSY N/A 02/26/2023   Procedure: SENTINEL NODE BIOPSY;  Surgeon: Viktoria Comer SAUNDERS, MD;  Location: [facility];  Service: Gynecology;   Laterality: N/A;   TONSILLECTOMY AND ADENOIDECTOMY  1965   Family History  Problem Relation Age of Onset   Colon cancer Mother 68   Stroke Mother 8       10/18/2017   Hyperlipidemia Mother    Breast cancer Mother 4 - 89   Cancer Mother    Early death Father    Arthritis Sister    Diabetes Brother        Type 2 Diabetes   Thyroid  cancer Maternal Aunt    Esophageal cancer Maternal Uncle    Diabetes Maternal Grandmother    Hearing loss Maternal Grandmother    Uterine cancer Maternal Grandmother 55 - 79   Stomach cancer Neg Hx    Rectal cancer Neg Hx    Social History   Occupational History   Occupation: Teacher, Adult Education: [employer]    Occupation: retird  Tobacco Use   Smoking status: Former    Types: E-cigarettes   Smokeless tobacco: Never  Vaping Use   Vaping status: Never Used  Substance and Sexual Activity   Alcohol use: Not Currently    Comment: I don't drink very often, but may have a beer a couple of times a year.   Drug use: Never   Sexual activity: Not Currently    Birth control/protection: Post-menopausal   Tobacco Counseling Counseling given: Not Answered  SDOH Screenings   Food Insecurity: No Food Insecurity (06/01/2024)  Housing: Low Risk (06/01/2024)  Transportation Needs: No Transportation Needs (06/01/2024)  Utilities: Not At Risk (  06/08/2024)  Alcohol Screen: Low Risk (06/01/2024)  Depression (PHQ2-9): Low Risk (06/08/2024)  Financial Resource Strain: Low Risk (06/01/2024)  Physical Activity: Insufficiently Active (06/01/2024)  Social Connections: Socially Isolated (06/01/2024)  Stress: No Stress Concern Present (06/01/2024)  Tobacco Use: Medium Risk (06/08/2024)  Health Literacy: Adequate Health Literacy (06/08/2024)   See flowsheets for full screening details  Depression Screen PHQ 2 & 9 Depression Scale- Over the past 2 weeks, how often have you been bothered by any of the following problems? Little interest or pleasure in doing  things: 0 Feeling down, depressed, or hopeless (PHQ Adolescent also includes...irritable): 0 PHQ-2 Total Score: 0 Trouble falling or staying asleep, or sleeping too much: 0 Feeling tired or having little energy: 0 Poor appetite or overeating (PHQ Adolescent also includes...weight loss): 0 Feeling bad about yourself - or that you are a failure or have let yourself or your family down: 0 Trouble concentrating on things, such as reading the newspaper or watching television (PHQ Adolescent also includes...like school work): 0 Moving or speaking so slowly that other people could have noticed. Or the opposite - being so fidgety or restless that you have been moving around a lot more than usual: 0 Thoughts that you would be better off dead, or of hurting yourself in some way: 0 PHQ-9 Total Score: 0 If you checked off any problems, how difficult have these problems made it for you to do your work, take care of things at home, or get along with other people?: Not difficult at all     Goals Addressed               This Visit's Progress     weight (pt-stated)        Weight loss             Objective:    Today's Vitals   06/08/24 1422  BP: 118/68  Pulse: 63  Temp: 98.6 F (37 C)  SpO2: 97%  Weight: 206 lb 12.8 oz (93.8 kg)  Height: 5' 8 (1.727 m)   Body mass index is 31.44 kg/m.  Hearing/Vision screen Hearing Screening - Comments:: Left ear hearing loss , HOH  in crowded rooms  Vision Screening - Comments:: Wears rx glasses - up to date with routine eye exams with [facility]  Immunizations and Health Maintenance Health Maintenance  Topic Date Due   DTaP/Tdap/Td (1 - Tdap) Never done   Mammogram  11/15/2023   Zoster Vaccines- Shingrix (2 of 2) 03/25/2024   Influenza Vaccine  08/17/2024 (Originally 12/19/2023)   COVID-19 Vaccine (8 - Pfizer risk 2025-26 season) 09/12/2024   Medicare Annual Wellness (AWV)  06/08/2025   Colonoscopy  01/15/2031   Pneumococcal  Vaccine: 50+ Years  Completed   Bone Density Scan  Completed   Hepatitis C Screening  Completed   Meningococcal B Vaccine  Aged Out        Assessment/Plan:  This is a routine wellness examination for [place].  Patient Care Team: Wendolyn Jenkins Jansky, MD as PCP - General (Family Medicine)  I have personally reviewed and noted the following in the patients chart:   Medical and social history Use of alcohol, tobacco or illicit drugs  Current medications and supplements including opioid prescriptions. Functional ability and status Nutritional status Physical activity Advanced directives List of other physicians Hospitalizations, surgeries, and ER visits in previous 12 months Vitals Screenings to include cognitive, depression, and falls Referrals and appointments  No orders of the defined types were placed in  this encounter.  In addition, I have reviewed and discussed with patient certain preventive protocols, quality metrics, and best practice recommendations. A written personalized care plan for preventive services as well as general preventive health recommendations were provided to patient.   Ellouise VEAR Haws, LPN   [id number]/[id number]   Return in about 1 year (around 06/13/2025).  After Visit Summary: (In Person-Printed) AVS printed and given to the patient  Nurse Notes: Patient advised to keep follow-up appointment with PCP (06/18/24) HM Addressed: Vaccines Due: and discussed  Pt had mammogram at [facility] 06/2023 will schedule for yearly    "

## 2024-06-08 NOTE — Patient Instructions (Signed)
 Crystal Bauer,  Thank you for taking the time for your Medicare Wellness Visit. I appreciate your continued commitment to your health goals. Please review the care plan we discussed, and feel free to reach out if I can assist you further.  Please note that Annual Wellness Visits do not include a physical exam. Some assessments may be limited, especially if the visit was conducted virtually. If needed, we may recommend an in-person follow-up with your provider.  Ongoing Care Seeing your primary care provider every 3 to 6 months helps us  monitor your health and provide consistent, personalized care.   Referrals If a referral was made during today's visit and you haven't received any updates within two weeks, please contact the referred provider directly to check on the status.  Recommended Screenings:  Health Maintenance  Topic Date Due   DTaP/Tdap/Td vaccine (1 - Tdap) Never done   Zoster (Shingles) Vaccine (1 of 2) Never done   Breast Cancer Screening  11/15/2023   Flu Shot  Never done   COVID-19 Vaccine (7 - 2025-26 season) 01/19/2024   Medicare Annual Wellness Visit  06/08/2025   Colon Cancer Screening  01/15/2031   Pneumococcal Vaccine for age over 53  Completed   Osteoporosis screening with Bone Density Scan  Completed   Hepatitis C Screening  Completed   Meningitis B Vaccine  Aged Out       04/14/2023    2:38 PM  Advanced Directives  Does Patient Have a Medical Advance Directive? No  Would patient like information on creating a medical advance directive? No - Patient declined    Vision: Annual vision screenings are recommended for early detection of glaucoma, cataracts, and diabetic retinopathy. These exams can also reveal signs of chronic conditions such as diabetes and high blood pressure.  Dental: Annual dental screenings help detect early signs of oral cancer, gum disease, and other conditions linked to overall health, including heart disease and diabetes.  Please see  the attached documents for additional preventive care recommendations.

## 2024-06-18 ENCOUNTER — Encounter: Payer: Self-pay | Admitting: Family Medicine

## 2024-06-18 ENCOUNTER — Ambulatory Visit: Payer: Medicare HMO | Admitting: Family Medicine

## 2024-06-18 VITALS — BP 118/70 | HR 76 | Temp 97.0°F | Ht 68.0 in | Wt 208.0 lb

## 2024-06-18 DIAGNOSIS — Z8542 Personal history of malignant neoplasm of other parts of uterus: Secondary | ICD-10-CM | POA: Diagnosis not present

## 2024-06-18 DIAGNOSIS — M810 Age-related osteoporosis without current pathological fracture: Secondary | ICD-10-CM | POA: Diagnosis not present

## 2024-06-18 DIAGNOSIS — Z0000 Encounter for general adult medical examination without abnormal findings: Secondary | ICD-10-CM

## 2024-06-18 DIAGNOSIS — K2270 Barrett's esophagus without dysplasia: Secondary | ICD-10-CM

## 2024-06-18 NOTE — Patient Instructions (Signed)
 It was very nice to see you today!  Tdap   PLEASE NOTE:  If you had any lab tests please let us  know if you have not heard back within a few days. You may see your results on MyChart before we have a chance to review them but we will give you a call once they are reviewed by us . If we ordered any referrals today, please let us  know if you have not heard from their office within the next week.   Please try these tips to maintain a healthy lifestyle:  Eat most of your calories during the day when you are active. Eliminate processed foods including packaged sweets (pies, cakes, cookies), reduce intake of potatoes, white bread, white pasta, and white rice. Look for whole grain options, oat flour or almond flour.  Each meal should contain half fruits/vegetables, one quarter protein, and one quarter carbs (no bigger than a computer mouse).  Cut down on sweet beverages. This includes juice, soda, and sweet tea. Also watch fruit intake, though this is a healthier sweet option, it still contains natural sugar! Limit to 3 servings daily.  Drink at least 1 glass of water  with each meal and aim for at least 8 glasses per day  Exercise at least 150 minutes every week.

## 2024-06-18 NOTE — Progress Notes (Signed)
 " Phone [phone]   Subjective:   Patient is a 69 y.o. female presenting for annual physical.    Chief Complaint  Patient presents with   Annual Exam    Pt is here for CPE    Discussed the use of AI scribe software for clinical note transcription with the patient, who gave verbal consent to proceed.  History of Present Illness Crystal Bauer is a 69 year old female who presents for an annual physical exam.  She has a history of osteoporosis and is not taking Prolia  due to concerns about potential side effects. She uses collagen peptides and engages in regular exercise, including physical therapy for a past shoulder fracture. She takes calcium and vitamin D  supplements, including Citracal and a vitamin D -containing supplement called Occupy. Her vitamin D  level was last checked in February 2025 and was 32, which is within the normal range.  Her gastrointestinal health is managed with Protonix , which effectively controls her heartburn. No issues with food getting stuck, dark stools, or trouble urinating are reported.  She has a history of endometrial cancer, which was treated and is currently being monitored with exams every six months.  Her A1c was elevated in 2023, but it has since returned to the top end of normal. She continues with diet and exercise to manage her blood sugar levels.  Overall, she feels well with no headaches, dizziness, vision changes, rashes, moles, chest pain, heart palpitations, cough, shortness of breath, vomiting, diarrhea, constipation, or muscle and joint pains aside from her left shoulder. She engages in regular physical activity, including physical therapy and plans to use a rowing machine at the gym.    See problem oriented charting- ROS- ROS: Gen: no fever, chills  Skin: no rash, itching ENT: no ear pain, ear drainage, nasal congestion, rhinorrhea, sinus pressure, sore throat Eyes: no blurry vision, double vision Resp: no cough, wheeze,SOB CV:  no CP, palpitations, LE edema,  GI: no heartburn, n/v/d/c, abd pain GU: no dysuria, urgency, frequency, hematuria MSK: no joint pain, myalgias, back pain Neuro: no dizziness, headache, weakness, vertigo Psych: no depression, anxiety, insomnia, SI   The following were reviewed and entered/updated in epic: Past Medical History:  Diagnosis Date   Allergy    Arthritis    Atrial fibrillation (HCC) 05/20/1998   from stress/increased caffeine use, resolved   Cancer (HCC)    Cataract    Dysrhythmia    GERD (gastroesophageal reflux disease)    Hearing loss 05/20/2010   left ear   History of chicken pox    History of endometrial cancer    Hyperlipidemia    Hyperplastic colon polyp    Macroprolactinemia    Orthostatic hypotension    Osteoporosis    Palpitation    Pituitary adenoma (HCC)    micro   Pneumonia    Patient Active Problem List   Diagnosis Date Noted   History of endometrial cancer 06/18/2024   Age-related osteoporosis without current pathological fracture 07/08/2023   Genetic testing 03/27/2023   Endometrial cancer (HCC) 02/26/2023   Barrett's esophagus 08/23/2022   Fibrocystic disease of left breast in female 11/17/2017   Family history of colon cancer in mother 11/17/2017   Family history of breast cancer in mother 11/17/2017   Vitamin D  insufficiency 12/26/2016   Low serum vitamin B12 12/26/2016   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   Multinodular goiter 02/26/2013   Past Surgical History:  Procedure Laterality Date   ABDOMINAL HYSTERECTOMY     CATARACT EXTRACTION  ROBOTIC ASSISTED TOTAL HYSTERECTOMY WITH BILATERAL SALPINGO OOPHERECTOMY Bilateral 02/26/2023   Procedure: XI ROBOTIC ASSISTED TOTAL HYSTERECTOMY WITH BILATERAL SALPINGO OOPHORECTOMY;  Surgeon: Viktoria Comer SAUNDERS, MD;  Location: [facility];  Service: Gynecology;  Laterality: Bilateral;   SENTINEL NODE BIOPSY N/A 02/26/2023   Procedure: SENTINEL NODE BIOPSY;  Surgeon: Viktoria Comer SAUNDERS, MD;   Location: [facility];  Service: Gynecology;  Laterality: N/A;   TONSILLECTOMY AND ADENOIDECTOMY  1965    Family History  Problem Relation Age of Onset   Colon cancer Mother 9   Stroke Mother 20       10/18/2017   Hyperlipidemia Mother    Breast cancer Mother 42 - 80   Cancer Mother    Early death Father    Arthritis Sister    Diabetes Brother        Type 2 Diabetes   Thyroid  cancer Maternal Aunt    Esophageal cancer Maternal Uncle    Diabetes Maternal Grandmother    Hearing loss Maternal Grandmother    Uterine cancer Maternal Grandmother 74 - 79   Stomach cancer Neg Hx    Rectal cancer Neg Hx     Medications- reviewed and updated Current Outpatient Medications  Medication Sig Dispense Refill   calcium citrate (CALCITRATE - DOSED IN MG ELEMENTAL CALCIUM) 950 (200 Ca) MG tablet Take 200 mg of elemental calcium by mouth daily.     Cholecalciferol 25 MCG (1000 UT) capsule Take 1,000 Units by mouth daily.     Collagen-Vitamin C-Biotin (COLLAGEN PO) Take by mouth. Added into smoothie with super greens, fiber,     Cyanocobalamin  (B-12) 1000 MCG CAPS Take 1,000 Units by mouth daily.     estradiol  (ESTRACE  VAGINAL) 0.1 MG/GM vaginal cream Use this 3 times a week at night.  Please do not use the applicator initially given recent surgery.  Instead, place a pea size amount of the vaginal estrogen on your finger and insert this into the bottom part of the vagina before you go to sleep. 42.5 g 3   Magnesium 400 MG CAPS Take 400 mg by mouth daily.     Multiple Vitamins-Minerals (EYE VITAMINS PO) Take by mouth. Ocuvite     pantoprazole  (PROTONIX ) 40 MG tablet Take 1 tablet (40 mg total) by mouth 2 (two) times daily. TAKE 1 TABLET BY MOUTH 2 TIMES A DAY BEFORE A MEAL 180 tablet 3   Current Facility-Administered Medications  Medication Dose Route Frequency Provider Last Rate Last Admin   denosumab  (PROLIA ) injection 60 mg  60 mg Subcutaneous Once Hlee Fringer Marie, MD        Allergies-reviewed and  updated Allergies[1]  Social History   Social History Narrative   Regular exercise-no Caffeine Use-yes      6 grands   retired   Objective  Objective:  BP 118/70 (BP Location: Left Arm, Patient Position: Sitting, Cuff Size: Normal)   Pulse 76   Temp (!) 97 F (36.1 C) (Temporal)   Ht 5' 8 (1.727 m)   Wt 208 lb (94.3 kg)   SpO2 98%   BMI 31.63 kg/m  Physical Exam  Gen: WDWN NAD HEENT: NCAT, conjunctiva not injected, sclera nonicteric TM WNL B, OP moist, no exudates  NECK:  supple, no thyromegaly, no nodes, no carotid bruits CARDIAC: RRR, S1S2+, no murmur. DP 2+B LUNGS: CTAB. No wheezes ABDOMEN:  BS+, soft, NTND, No HSM, no masses EXT:  no edema MSK: no gross abnormalities. MS 5/5 all 4 NEURO: A&O x3.  CN II-XII  intact.  PSYCH: normal mood. Good eye contact     Assessment and Plan   Health Maintenance counseling: 1. Anticipatory guidance: Patient counseled regarding regular dental exams q6 months, eye exams,  avoiding smoking and second hand smoke, limiting alcohol to 1 beverage per day, no illicit drugs.   2. Risk factor reduction:  Advised patient of need for regular exercise and diet rich and fruits and vegetables to reduce risk of heart attack and stroke. Exercise- +.  Wt Readings from Last 3 Encounters:  06/18/24 208 lb (94.3 kg)  06/08/24 206 lb 12.8 oz (93.8 kg)  01/15/24 207 lb (93.9 kg)   3. Immunizations/screenings/ancillary studies Immunization History  Administered Date(s) Administered   PFIZER Comirnaty(Gray Top)Covid-19 Tri-Sucrose Vaccine 08/01/2019, 08/24/2019, 03/24/2020, 09/26/2020   PNEUMOCOCCAL CONJUGATE-20 06/16/2023   Pfizer Covid-19 Vaccine Bivalent Booster 54yrs & up 05/18/2021   Pfizer(Comirnaty)Fall Seasonal Vaccine 12 years and older 02/11/2023, 03/14/2024   Zoster Recombinant(Shingrix) 01/29/2024, 06/12/2024   There are no preventive care reminders to display for this patient.   4. Cervical cancer screening- utd 5. Breast cancer  screening-  mammogram scheduled 6. Colon cancer screening - utd 7. Skin cancer screening- advised regular sunscreen use. Denies worrisome, changing, or new skin lesions.  8. Birth control/STD check- n/a 9. Osteoporosis screening- due 1 yr 10. Smoking associated screening - non smoker  Wellness examination  Age-related osteoporosis without current pathological fracture  Barrett's esophagus without dysplasia  History of endometrial cancer    Assessment and Plan Assessment & Plan Age-related osteoporosis   She manages osteoporosis with collagen peptides, calcium, and vitamin D  supplementation, and exercises regularly.  Continue collagen peptides, calcium, and vitamin D  supplementation. Maintain regular exercise, including physical therapy and rowing machine. Schedule osteoporosis scan next year.  Barrett's esophagus without dysplasia   Barrett's esophagus is well-managed with Protonix , effectively controlling symptoms like heartburn. No new symptoms such as food impaction or dark stools are reported. Continue Protonix  for symptom control. Seeing GI  History of endometrial cancer   Endometrial cancer was contained and treated. She is under regular follow-up every six months with no additional scans or treatments required. Continue regular follow-up every six months.  General Health Maintenance   She is up to date with mammograms and received the second shingles vaccine. She is considering the TDAP vaccine due to its pertussis component, despite previous adverse reactions. Scheduled mammogram for February. Consider TDAP vaccine.     Recommended follow up: Return in about 1 year (around 06/18/2025) for annual physical.  Lab/Order associations:n/a fasting  Crystal CHRISTELLA Carrel, MD      [1]  Allergies Allergen Reactions   Erythromycin Shortness Of Breath and Anaphylaxis   Procaine Palpitations    Chest Pain    Quinolones Other (See Comments)    Insomnia Stay awake all night    Neosporin [Bacitracin-Polymyxin B] Dermatitis   "

## 2024-09-21 ENCOUNTER — Ambulatory Visit: Admitting: Gynecologic Oncology

## 2025-01-10 ENCOUNTER — Ambulatory Visit: Admitting: Internal Medicine

## 2025-01-13 ENCOUNTER — Ambulatory Visit: Admitting: Internal Medicine

## 2025-06-14 ENCOUNTER — Ambulatory Visit

## 2025-06-22 ENCOUNTER — Encounter: Admitting: Family Medicine
